# Patient Record
Sex: Male | Born: 1935
Health system: Southern US, Community
[De-identification: ages and names within clinical notes are randomized; demographics above are authoritative.]

## PROBLEM LIST (undated history)

## (undated) DIAGNOSIS — I10 Essential (primary) hypertension: Secondary | ICD-10-CM

## (undated) DIAGNOSIS — C7951 Secondary malignant neoplasm of bone: Secondary | ICD-10-CM

## (undated) DIAGNOSIS — Z960 Presence of urogenital implants: Secondary | ICD-10-CM

## (undated) DIAGNOSIS — Z978 Presence of other specified devices: Secondary | ICD-10-CM

## (undated) DIAGNOSIS — K219 Gastro-esophageal reflux disease without esophagitis: Secondary | ICD-10-CM

## (undated) DIAGNOSIS — C61 Malignant neoplasm of prostate: Secondary | ICD-10-CM

## (undated) DIAGNOSIS — Z634 Disappearance and death of family member: Secondary | ICD-10-CM

## (undated) DIAGNOSIS — N400 Enlarged prostate without lower urinary tract symptoms: Secondary | ICD-10-CM

---
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
## 1979-03-12 HISTORY — PX: MULTIPLE TOOTH EXTRACTIONS: SHX2053

## 2013-07-11 DIAGNOSIS — Z634 Disappearance and death of family member: Secondary | ICD-10-CM

## 2013-07-11 HISTORY — DX: Disappearance and death of family member: Z63.4

## 2014-08-30 ENCOUNTER — Encounter (HOSPITAL_COMMUNITY): Payer: Self-pay | Admitting: *Deleted

## 2014-08-30 ENCOUNTER — Emergency Department (HOSPITAL_COMMUNITY): Payer: Medicare Other

## 2014-08-30 ENCOUNTER — Emergency Department (HOSPITAL_COMMUNITY)
Admission: EM | Admit: 2014-08-30 | Discharge: 2014-08-30 | Disposition: A | Discharge status: Home / Self Care | Payer: Medicare Other | Attending: Emergency Medicine | Admitting: Emergency Medicine

## 2014-08-30 DIAGNOSIS — Z79899 Other long term (current) drug therapy: Secondary | ICD-10-CM | POA: Insufficient documentation

## 2014-08-30 DIAGNOSIS — R42 Dizziness and giddiness: Secondary | ICD-10-CM | POA: Insufficient documentation

## 2014-08-30 DIAGNOSIS — I10 Essential (primary) hypertension: Secondary | ICD-10-CM | POA: Insufficient documentation

## 2014-08-30 HISTORY — DX: Essential (primary) hypertension: I10

## 2014-08-30 LAB — COMPREHENSIVE METABOLIC PANEL
ALBUMIN: 3.9 g/dL (ref 3.5–5.2)
ALT: 14 U/L (ref 0–53)
ANION GAP: 11 (ref 5–15)
AST: 18 U/L (ref 0–37)
Alkaline Phosphatase: 131 U/L — ABNORMAL HIGH (ref 39–117)
BUN: 10 mg/dL (ref 6–23)
CO2: 22 mmol/L (ref 19–32)
CREATININE: 1.04 mg/dL (ref 0.50–1.35)
Calcium: 9.8 mg/dL (ref 8.4–10.5)
Chloride: 105 mmol/L (ref 96–112)
GFR calc Af Amer: 75 mL/min — ABNORMAL LOW (ref >90)
GFR, EST NON AFRICAN AMERICAN: 64 mL/min — AB (ref >90)
Glucose, Bld: 117 mg/dL — ABNORMAL HIGH (ref 70–99)
Potassium: 4.8 mmol/L (ref 3.5–5.1)
Sodium: 138 mmol/L (ref 135–145)
Total Bilirubin: 0.8 mg/dL (ref 0.3–1.2)
Total Protein: 7.6 g/dL (ref 6.0–8.3)

## 2014-08-30 LAB — CBC
HEMATOCRIT: 45.3 % (ref 39.0–52.0)
Hemoglobin: 14.7 g/dL (ref 13.0–17.0)
MCH: 29.8 pg (ref 26.0–34.0)
MCHC: 32.5 g/dL (ref 30.0–36.0)
MCV: 91.9 fL (ref 78.0–100.0)
Platelets: 166 10*3/uL (ref 150–400)
RBC: 4.93 MIL/uL (ref 4.22–5.81)
RDW: 14 % (ref 11.5–15.5)
WBC: 8.1 10*3/uL (ref 4.0–10.5)

## 2014-08-30 LAB — URINALYSIS, ROUTINE W REFLEX MICROSCOPIC
BILIRUBIN URINE: NEGATIVE
GLUCOSE, UA: NEGATIVE mg/dL
HGB URINE DIPSTICK: NEGATIVE
KETONES UR: NEGATIVE mg/dL
LEUKOCYTES UA: NEGATIVE
Nitrite: NEGATIVE
Protein, ur: NEGATIVE mg/dL
Specific Gravity, Urine: 1.013 (ref 1.005–1.030)
Urobilinogen, UA: 0.2 mg/dL (ref 0.0–1.0)
pH: 6 (ref 5.0–8.0)

## 2014-08-30 LAB — I-STAT TROPONIN, ED: TROPONIN I, POC: 0 ng/mL (ref 0.00–0.08)

## 2014-08-30 LAB — CBG MONITORING, ED: GLUCOSE-CAPILLARY: 126 mg/dL — AB (ref 70–99)

## 2014-08-30 LAB — BRAIN NATRIURETIC PEPTIDE: B Natriuretic Peptide: 156.3 pg/mL — ABNORMAL HIGH (ref 0.0–100.0)

## 2014-08-30 MED ORDER — MECLIZINE HCL 50 MG PO TABS: 50.0000 mg | ORAL_TABLET | Freq: Three times a day (TID) | ORAL | Status: DC | PRN

## 2014-08-30 MED ORDER — MECLIZINE HCL 25 MG PO TABS
25.0000 mg | ORAL_TABLET | Freq: Once | ORAL | Status: AC
  Administered 2014-08-30: 25 mg via ORAL
  Filled 2014-08-30: qty 1

## 2014-08-30 NOTE — ED Notes (Signed)
Pt reports feeling weak and lightheaded x 2 days and then off balance and dizzy since this am. Reports sob, no acute distress noted at triage.

## 2014-08-30 NOTE — ED Provider Notes (Signed)
Patient signed out to me by Cartner, PA-C.  Patient has had 2 days of dizziness.  Seems to be vertiginous, as it is worsened with head movements.  However, patient did fall off balance and had to be caught by son.  Additionally, he states that his vision has changed.  Now states that objects appear farther away than they actually are.  Plan:  MRI brain.  6:00 PM MRI is negative. Symptoms are reproducible with head movement. Symptoms resolve when patient with head still. Suspected vertigo. Treat with meclizine and discharged home. Recommend neurology follow-up. Patient understands and agrees with plan. He is stable and ready for discharge.  Results for orders placed or performed during the hospital encounter of 08/30/14  CBC  Result Value Ref Range   WBC 8.1 4.0 - 10.5 K/uL   RBC 4.93 4.22 - 5.81 MIL/uL   Hemoglobin 14.7 13.0 - 17.0 g/dL   HCT 45.3 39.0 - 52.0 %   MCV 91.9 78.0 - 100.0 fL   MCH 29.8 26.0 - 34.0 pg   MCHC 32.5 30.0 - 36.0 g/dL   RDW 14.0 11.5 - 15.5 %   Platelets 166 150 - 400 K/uL  BNP (order ONLY if patient complains of dyspnea/SOB AND you have documented it for THIS visit)  Result Value Ref Range   B Natriuretic Peptide 156.3 (H) 0.0 - 100.0 pg/mL  Comprehensive metabolic panel  Result Value Ref Range   Sodium 138 135 - 145 mmol/L   Potassium 4.8 3.5 - 5.1 mmol/L   Chloride 105 96 - 112 mmol/L   CO2 22 19 - 32 mmol/L   Glucose, Bld 117 (H) 70 - 99 mg/dL   BUN 10 6 - 23 mg/dL   Creatinine, Ser 1.04 0.50 - 1.35 mg/dL   Calcium 9.8 8.4 - 10.5 mg/dL   Total Protein 7.6 6.0 - 8.3 g/dL   Albumin 3.9 3.5 - 5.2 g/dL   AST 18 0 - 37 U/L   ALT 14 0 - 53 U/L   Alkaline Phosphatase 131 (H) 39 - 117 U/L   Total Bilirubin 0.8 0.3 - 1.2 mg/dL   GFR calc non Af Amer 64 (L) >90 mL/min   GFR calc Af Amer 75 (L) >90 mL/min   Anion gap 11 5 - 15  Urinalysis, Routine w reflex microscopic  Result Value Ref Range   Color, Urine YELLOW YELLOW   APPearance CLEAR CLEAR   Specific Gravity, Urine 1.013 1.005 - 1.030   pH 6.0 5.0 - 8.0   Glucose, UA NEGATIVE NEGATIVE mg/dL   Hgb urine dipstick NEGATIVE NEGATIVE   Bilirubin Urine NEGATIVE NEGATIVE   Ketones, ur NEGATIVE NEGATIVE mg/dL   Protein, ur NEGATIVE NEGATIVE mg/dL   Urobilinogen, UA 0.2 0.0 - 1.0 mg/dL   Nitrite NEGATIVE NEGATIVE   Leukocytes, UA NEGATIVE NEGATIVE  I-stat troponin, ED (not at [facility])  Result Value Ref Range   Troponin i, poc 0.00 0.00 - 0.08 ng/mL   Comment 3          CBG monitoring, ED  Result Value Ref Range   Glucose-Capillary 126 (H) 70 - 99 mg/dL   Comment 1 Notify RN    Comment 2 Documented in Char    Dg Chest 2 View  08/30/2014   CLINICAL DATA:  Began feeling dizzy with imbalance 3 days ago, pain at front of head when he stands up, former smoker, hypertension  EXAM: CHEST  2 VIEW  COMPARISON:  None  FINDINGS: Normal heart size,  mediastinal contours and pulmonary vascularity.  Atherosclerotic calcification at aortic arch.  Mild elevation of RIGHT diaphragm with RIGHT basilar atelectasis.  Biapical scarring.  Remaining lungs clear.  No pleural effusion or pneumothorax.  Scattered endplate spur formation thoracic spine.  IMPRESSION: Biapical scarring with RIGHT basilar atelectasis.   Electronically Signed   By: Lavonia Dana M.D.   On: 08/30/2014 15:52   Ct Head Wo Contrast  08/30/2014   CLINICAL DATA:  Lightheaded with unsteady gait for 2 days, generalized weakness, dizziness, no known injury, history hypertension  EXAM: CT HEAD WITHOUT CONTRAST  TECHNIQUE: Contiguous axial images were obtained from the base of the skull through the vertex without intravenous contrast.  COMPARISON:  None  FINDINGS: Generalized atrophy.  Normal ventricular morphology.  No midline shift or mass effect.  Otherwise normal appearance of brain parenchyma.  No intracranial hemorrhage, mass lesion or evidence acute infarction.  No extra-axial fluid collections.  Partial opacification of frontal sinus.  Osseous  structures unremarkable.  IMPRESSION: No acute intracranial abnormalities.  Mild frontal sinus disease.   Electronically Signed   By: Lavonia Dana M.D.   On: 08/30/2014 16:37   Mr Brain Wo Contrast  08/30/2014   CLINICAL DATA:  Hypertension and dizziness.  Rule out stroke.  EXAM: MRI HEAD WITHOUT CONTRAST  TECHNIQUE: Multiplanar, multiecho pulse sequences of the brain and surrounding structures were obtained without intravenous contrast.  COMPARISON:  CT 08/30/2014  FINDINGS: Cerebral atrophy most prominent in the frontal and temporal lobes. Negative for hydrocephalus. Brainstem and cerebellum are normal.  Negative for acute or chronic infarction. Cerebral white matter is normal.  Negative for intracranial hemorrhage or fluid collection  Negative for mass or edema.  No shift of the midline structures.  Pituitary normal in size.  Calvarium intact.  IMPRESSION: Cerebral atrophy most prominent in the frontal and temporal lobes. No acute abnormality. Negative for cerebral vascular ischemic change.   Electronically Signed   By: Franchot Gallo M.D.   On: 08/30/2014 17:36      Montine Circle, PA-C 08/30/14 1801  Veryl Speak, MD 08/30/14 [phone]

## 2014-08-30 NOTE — ED Notes (Signed)
Pt transported to Xray. 

## 2014-08-30 NOTE — ED Provider Notes (Signed)
CSN: [id number]     Arrival date & time 08/30/14  1410 History   First MD Initiated Contact with Patient 08/30/14 1436     Chief Complaint  Patient presents with  . Dizziness     (Consider location/radiation/quality/duration/timing/severity/associated sxs/prior Treatment) HPI Gregory [name] is a 81 y.o. male history of hypertension comes in for evaluation of dizziness. Patient is accompanied by his son who contributes to the history of present illness. Patient says for the past 3 days he has felt dizzy and characterizes this sensation has been "spun around a lot". He reports this sensation is exacerbated when he stands up or moves his head to one side or the other quickly. He reports today he lost his balance at approximately 11:00 when he stood up to go check the mail. His son was there to catch him and lower him to the couch. He also reports that he feels like his vision is changing and that things appear "much further away than they are". He denies headache, numbness or unilateral weakness, chest pain, shortness of breath, cough, nausea or vomiting, diaphoresis, abdominal pain, diarrhea or constipation, new skin rashes, urinary symptoms.  Past Medical History  Diagnosis Date  . Hypertension    History reviewed. No pertinent past surgical history. History reviewed. No pertinent family history. History  Substance Use Topics  . Smoking status: Not on file  . Smokeless tobacco: Not on file  . Alcohol Use: No    Review of Systems A 10 point review of systems was completed and was negative except for pertinent positives and negatives as mentioned in the history of present illness     Allergies  Review of patient's allergies indicates no known allergies.  Home Medications   Prior to Admission medications   Medication Sig Start Date End Date Taking? Authorizing Provider  atenolol (TENORMIN) 100 MG tablet Take 100 mg by mouth daily.   Yes Historical Provider, MD  finasteride  (PROSCAR) 5 MG tablet Take 5 mg by mouth daily.   Yes Historical Provider, MD  Multiple Vitamins-Minerals (MULTIVITAMIN WITH MINERALS) tablet Take 1 tablet by mouth daily.   Yes Historical Provider, MD  tamsulosin (FLOMAX) 0.4 MG CAPS capsule Take 0.4 mg by mouth daily after supper.   Yes Historical Provider, MD   BP 155/104 mmHg  Pulse 71  Temp(Src) 97.8 F (36.6 C) (Oral)  Resp 18  Ht 6\' 2"  (1.88 m)  Wt 200 lb (90.719 kg)  BMI 25.67 kg/m2  SpO2 96% Physical Exam  Constitutional: He is oriented to person, place, and time. He appears well-developed and well-nourished.  HENT:  Head: Normocephalic and atraumatic.  Mouth/Throat: Oropharynx is clear and moist.  Eyes: Conjunctivae are normal. Pupils are equal, round, and reactive to light. Right eye exhibits no discharge. Left eye exhibits no discharge. No scleral icterus.  Neck: Neck supple.  Cardiovascular: Normal rate, regular rhythm and normal heart sounds.   Pulmonary/Chest: Effort normal and breath sounds normal. No respiratory distress. He has no wheezes. He has no rales.  Abdominal: Soft. There is no tenderness.  Musculoskeletal: He exhibits no tenderness.  Neurological: He is alert and oriented to person, place, and time.  Cranial Nerves II-XII grossly intact. Motor and sensation 5/5 in all 4 extremities. Extraocular movements intact without nystagmus. Completes finger to nose and heel to shin coordination movements without difficulty. Dizzy sensation is reproduced when patient moves head quickly to left and right.  Skin: Skin is warm and dry. No rash noted.  Psychiatric:  He has a normal mood and affect.  Nursing note and vitals reviewed.   ED Course  Procedures (including critical care time) Labs Review Labs Reviewed  BRAIN NATRIURETIC PEPTIDE - Abnormal; Notable for the following:    B Natriuretic Peptide 156.3 (*)    All other components within normal limits  COMPREHENSIVE METABOLIC PANEL - Abnormal; Notable for the  following:    Glucose, Bld 117 (*)    Alkaline Phosphatase 131 (*)    GFR calc non Af Amer 64 (*)    GFR calc Af Amer 75 (*)    All other components within normal limits  CBG MONITORING, ED - Abnormal; Notable for the following:    Glucose-Capillary 126 (*)    All other components within normal limits  CBC  URINALYSIS, ROUTINE W REFLEX MICROSCOPIC  I-STAT TROPOININ, ED    Imaging Review Dg Chest 2 View  08/30/2014   CLINICAL DATA:  Began feeling dizzy with imbalance 3 days ago, pain at front of head when he stands up, former smoker, hypertension  EXAM: CHEST  2 VIEW  COMPARISON:  None  FINDINGS: Normal heart size, mediastinal contours and pulmonary vascularity.  Atherosclerotic calcification at aortic arch.  Mild elevation of RIGHT diaphragm with RIGHT basilar atelectasis.  Biapical scarring.  Remaining lungs clear.  No pleural effusion or pneumothorax.  Scattered endplate spur formation thoracic spine.  IMPRESSION: Biapical scarring with RIGHT basilar atelectasis.   Electronically Signed   By: Lavonia Dana M.D.   On: 08/30/2014 15:52     EKG Interpretation None     Meds given in ED:  Medications - No data to display  New Prescriptions   No medications on file   Filed Vitals:   08/30/14 1416  BP: 155/104  Pulse: 71  Temp: 97.8 F (36.6 C)  TempSrc: Oral  Resp: 18  Height: 6\' 2"  (1.88 m)  Weight: 200 lb (90.719 kg)  SpO2: 96%    MDM  Vitals stable - WNL -afebrile Pt resting comfortably in ED. PE- normal neuro exam Labwork- noncontributory Imaging--CXR shows biapical scarring with R basilar atelectasis. Pending CT and subsequent MR brain.  Prior to pt sign out, I discussed and reviewed this patient with my attending, Dr. Jeanell Sparrow.  Care transferred to [facility], PA-C for follow up on head CT and subsequent MR. Provided results are normal. I feel pt may be discharged home to follow up outpt with his PCP and Neurologist. His symptoms today appear to be consistent with  Vertigo. No evidence of an acute central process at this time. If MR negative, DC with Meclizine.   Final diagnoses:  None        Verl Dicker, PA-C 08/30/14 2001  Shaune Pollack, MD 09/03/14 [phone]

## 2014-08-30 NOTE — Discharge Instructions (Signed)
Dizziness °Dizziness is a common problem. It is a feeling of unsteadiness or light-headedness. You may feel like you are about to faint. Dizziness can lead to injury if you stumble or fall. A person of any age group can suffer from dizziness, but dizziness is more common in older adults. °CAUSES  °Dizziness can be caused by many different things, including: °· Middle ear problems. °· Standing for too long. °· Infections. °· An allergic reaction. °· Aging. °· An emotional response to something, such as the sight of blood. °· Side effects of medicines. °· Tiredness. °· Problems with circulation or blood pressure. °· Excessive use of alcohol or medicines, or illegal drug use. °· Breathing too fast (hyperventilation). °· An irregular heart rhythm (arrhythmia). °· A low red blood cell count (anemia). °· Pregnancy. °· Vomiting, diarrhea, fever, or other illnesses that cause body fluid loss (dehydration). °· Diseases or conditions such as Parkinson's disease, high blood pressure (hypertension), diabetes, and thyroid problems. °· Exposure to extreme heat. °DIAGNOSIS  °Your health care provider will ask about your symptoms, perform a physical exam, and perform an electrocardiogram (ECG) to record the electrical activity of your heart. Your health care provider may also perform other heart or blood tests to determine the cause of your dizziness. These may include: °· Transthoracic echocardiogram (TTE). During echocardiography, sound waves are used to evaluate how blood flows through your heart. °· Transesophageal echocardiogram (TEE). °· Cardiac monitoring. This allows your health care provider to monitor your heart rate and rhythm in real time. °· Holter monitor. This is a portable device that records your heartbeat and can help diagnose heart arrhythmias. It allows your health care provider to track your heart activity for several days if needed. °· Stress tests by exercise or by giving medicine that makes the heart beat  faster. °TREATMENT  °Treatment of dizziness depends on the cause of your symptoms and can vary greatly. °HOME CARE INSTRUCTIONS  °· Drink enough fluids to keep your urine clear or pale yellow. This is especially important in very hot weather. In older adults, it is also important in cold weather. °· Take your medicine exactly as directed if your dizziness is caused by medicines. When taking blood pressure medicines, it is especially important to get up slowly. °¨ Rise slowly from chairs and steady yourself until you feel okay. °¨ In the morning, first sit up on the side of the bed. When you feel okay, stand slowly while holding onto something until you know your balance is fine. °· Move your legs often if you need to stand in one place for a long time. Tighten and relax your muscles in your legs while standing. °· Have someone stay with you for 1-2 days if dizziness continues to be a problem. Do this until you feel you are well enough to stay alone. Have the person call your health care provider if he or she notices changes in you that are concerning. °· Do not drive or use heavy machinery if you feel dizzy. °· Do not drink alcohol. °SEEK IMMEDIATE MEDICAL CARE IF:  °· Your dizziness or light-headedness gets worse. °· You feel nauseous or vomit. °· You have problems talking, walking, or using your arms, hands, or legs. °· You feel weak. °· You are not thinking clearly or you have trouble forming sentences. It may take a friend or family member to notice this. °· You have chest pain, abdominal pain, shortness of breath, or sweating. °· Your vision changes. °· You notice   any bleeding. °· You have side effects from medicine that seems to be getting worse rather than better. °MAKE SURE YOU:  °· Understand these instructions. °· Will watch your condition. °· Will get help right away if you are not doing well or get worse. °Document Released: 12/21/2000 Document Revised: 07/02/2013 Document Reviewed: 01/14/2011 °ExitCare®  Patient Information ©2015 [employer]. This information is not intended to replace advice given to you by your health care provider. Make sure you discuss any questions you have with your health care provider. ° °Benign Positional Vertigo °Vertigo means you feel like you or your surroundings are moving when they are not. Benign positional vertigo is the most common form of vertigo. Benign means that the cause of your condition is not serious. Benign positional vertigo is more common in older adults. °CAUSES  °Benign positional vertigo is the result of an upset in the labyrinth system. This is an area in the middle ear that helps control your balance. This may be caused by a viral infection, head injury, or repetitive motion. However, often no specific cause is found. °SYMPTOMS  °Symptoms of benign positional vertigo occur when you move your head or eyes in different directions. Some of the symptoms may include: °· Loss of balance and falls. °· Vomiting. °· Blurred vision. °· Dizziness. °· Nausea. °· Involuntary eye movements (nystagmus). °DIAGNOSIS  °Benign positional vertigo is usually diagnosed by physical exam. If the specific cause of your benign positional vertigo is unknown, your caregiver may perform imaging tests, such as magnetic resonance imaging (MRI) or computed tomography (CT). °TREATMENT  °Your caregiver may recommend movements or procedures to correct the benign positional vertigo. Medicines such as meclizine, benzodiazepines, and medicines for nausea may be used to treat your symptoms. In rare cases, if your symptoms are caused by certain conditions that affect the inner ear, you may need surgery. °HOME CARE INSTRUCTIONS  °· Follow your caregiver's instructions. °· Move slowly. Do not make sudden body or head movements. °· Avoid driving. °· Avoid operating heavy machinery. °· Avoid performing any tasks that would be dangerous to you or others during a vertigo episode. °· Drink enough fluids to keep  your urine clear or pale yellow. °SEEK IMMEDIATE MEDICAL CARE IF:  °· You develop problems with walking, weakness, numbness, or using your arms, hands, or legs. °· You have difficulty speaking. °· You develop severe headaches. °· Your nausea or vomiting continues or gets worse. °· You develop visual changes. °· Your family or friends notice any behavioral changes. °· Your condition gets worse. °· You have a fever. °· You develop a stiff neck or sensitivity to light. °MAKE SURE YOU:  °· Understand these instructions. °· Will watch your condition. °· Will get help right away if you are not doing well or get worse. °Document Released: 04/04/2006 Document Revised: 09/19/2011 Document Reviewed: 03/17/2011 °ExitCare® Patient Information ©2015 [employer]. This information is not intended to replace advice given to you by your health care provider. Make sure you discuss any questions you have with your health care provider. ° °

## 2015-04-01 ENCOUNTER — Encounter (HOSPITAL_COMMUNITY): Payer: Self-pay | Admitting: Emergency Medicine

## 2015-04-01 ENCOUNTER — Emergency Department (HOSPITAL_COMMUNITY)
Admission: EM | Admit: 2015-04-01 | Discharge: 2015-04-01 | Disposition: A | Discharge status: Home / Self Care | Payer: Medicare Other | Attending: Emergency Medicine | Admitting: Emergency Medicine

## 2015-04-01 ENCOUNTER — Emergency Department (HOSPITAL_COMMUNITY): Payer: Medicare Other

## 2015-04-01 DIAGNOSIS — I10 Essential (primary) hypertension: Secondary | ICD-10-CM | POA: Diagnosis not present

## 2015-04-01 DIAGNOSIS — Z79899 Other long term (current) drug therapy: Secondary | ICD-10-CM | POA: Insufficient documentation

## 2015-04-01 DIAGNOSIS — R5383 Other fatigue: Secondary | ICD-10-CM | POA: Diagnosis not present

## 2015-04-01 DIAGNOSIS — Z87891 Personal history of nicotine dependence: Secondary | ICD-10-CM | POA: Diagnosis not present

## 2015-04-01 LAB — URINALYSIS, ROUTINE W REFLEX MICROSCOPIC
Bilirubin Urine: NEGATIVE
Glucose, UA: NEGATIVE mg/dL
HGB URINE DIPSTICK: NEGATIVE
Ketones, ur: NEGATIVE mg/dL
Leukocytes, UA: NEGATIVE
Nitrite: NEGATIVE
PH: 6.5 (ref 5.0–8.0)
Protein, ur: NEGATIVE mg/dL
SPECIFIC GRAVITY, URINE: 1.013 (ref 1.005–1.030)
UROBILINOGEN UA: 1 mg/dL (ref 0.0–1.0)

## 2015-04-01 LAB — TSH: TSH: 1.236 u[IU]/mL (ref 0.350–4.500)

## 2015-04-01 LAB — CBC
HEMATOCRIT: 45.2 % (ref 39.0–52.0)
HEMOGLOBIN: 15 g/dL (ref 13.0–17.0)
MCH: 29.6 pg (ref 26.0–34.0)
MCHC: 33.2 g/dL (ref 30.0–36.0)
MCV: 89.3 fL (ref 78.0–100.0)
Platelets: 187 10*3/uL (ref 150–400)
RBC: 5.06 MIL/uL (ref 4.22–5.81)
RDW: 13.8 % (ref 11.5–15.5)
WBC: 10.1 10*3/uL (ref 4.0–10.5)

## 2015-04-01 LAB — I-STAT TROPONIN, ED: Troponin i, poc: 0 ng/mL (ref 0.00–0.08)

## 2015-04-01 LAB — BASIC METABOLIC PANEL
ANION GAP: 12 (ref 5–15)
BUN: 13 mg/dL (ref 6–20)
CHLORIDE: 102 mmol/L (ref 101–111)
CO2: 23 mmol/L (ref 22–32)
Calcium: 9.5 mg/dL (ref 8.9–10.3)
Creatinine, Ser: 1.2 mg/dL (ref 0.61–1.24)
GFR calc Af Amer: >60 mL/min (ref >60)
GFR, EST NON AFRICAN AMERICAN: 54 mL/min — AB (ref >60)
GLUCOSE: 128 mg/dL — AB (ref 65–99)
POTASSIUM: 3.9 mmol/L (ref 3.5–5.1)
Sodium: 137 mmol/L (ref 135–145)

## 2015-04-01 LAB — POC OCCULT BLOOD, ED: Fecal Occult Bld: NEGATIVE

## 2015-04-01 LAB — CBG MONITORING, ED: GLUCOSE-CAPILLARY: 138 mg/dL — AB (ref 65–99)

## 2015-04-01 MED ORDER — SODIUM CHLORIDE 0.9 % IV BOLUS (SEPSIS)
1000.0000 mL | Freq: Once | INTRAVENOUS | Status: AC
  Administered 2015-04-01: 1000 mL via INTRAVENOUS

## 2015-04-01 MED ORDER — SULFACETAMIDE SODIUM 10 % OP SOLN: 2.0000 [drp] | OPHTHALMIC | Status: AC

## 2015-04-01 NOTE — ED Notes (Signed)
Lab states unable to add troponin on due to blood volume in tube, istat ordered and phlebotomy at bedside.

## 2015-04-01 NOTE — ED Notes (Signed)
Freda Munro in main lab will add on regular troponin.   PA, Lawyer made aware.

## 2015-04-01 NOTE — Discharge Instructions (Signed)
Return here as needed.  Follow-up with your primary care doctor.  Your testing here today did not show any significant abnormality

## 2015-04-01 NOTE — ED Provider Notes (Signed)
CSN: [id number]     Arrival date & time 04/01/15  1159 History   First MD Initiated Contact with Patient 04/01/15 1235     Chief Complaint  Patient presents with  . Fatigue     (Consider location/radiation/quality/duration/timing/severity/associated sxs/prior Treatment) HPI Patient presents to the emergency department with generalized weakness a thin ongoing for over a month.  The patient states that he has been feeling more fatigued and tired lately.  He states that nothing seems make his condition, better or worse.  Patient states that he may been going on longer than a month, but noticed it for the last month.  The patient states that he states that he has not had any chest pain, shortness of breath, headache, blurred vision, fevers, cough, numbness, dizziness, headache, back pain, neck pain, dysuria, incontinence, difficulty walking or syncope.  Patient states that he has not seen his primary care doctor about these symptoms Past Medical History  Diagnosis Date  . Hypertension   . Enlarged prostate    History reviewed. No pertinent past surgical history. No family history on file. Social History  Substance Use Topics  . Smoking status: Former Smoker    Types: Cigarettes  . Smokeless tobacco: None  . Alcohol Use: No    Review of Systems  All other systems negative except as documented in the HPI. All pertinent positives and negatives as reviewed in the HPI.  Allergies  Review of patient's allergies indicates no known allergies.  Home Medications   Prior to Admission medications   Medication Sig Start Date End Date Taking? Authorizing Alcide Memoli  atenolol (TENORMIN) 100 MG tablet Take 100 mg by mouth daily.    Historical Krystianna Soth, MD  finasteride (PROSCAR) 5 MG tablet Take 5 mg by mouth daily.    Historical Xzaria Teo, MD  meclizine (ANTIVERT) 50 MG tablet Take 1 tablet (50 mg total) by mouth 3 (three) times daily as needed. 08/30/14   Montine Circle, PA-C  Multiple  Vitamins-Minerals (MULTIVITAMIN WITH MINERALS) tablet Take 1 tablet by mouth daily.    Historical Bradley Handyside, MD  tamsulosin (FLOMAX) 0.4 MG CAPS capsule Take 0.4 mg by mouth daily after supper.    Historical Azalia Neuberger, MD   BP 137/72 mmHg  Pulse 62  Temp(Src) 97.6 F (36.4 C) (Oral)  Resp 14  SpO2 98% Physical Exam  Constitutional: He is oriented to person, place, and time. He appears well-developed and well-nourished. No distress.  HENT:  Head: Normocephalic and atraumatic.  Mouth/Throat: Oropharynx is clear and moist.  Eyes: Pupils are equal, round, and reactive to light.  Neck: Normal range of motion. Neck supple.  Cardiovascular: Normal rate, regular rhythm and normal heart sounds.  Exam reveals no gallop and no friction rub.   No murmur heard. Pulmonary/Chest: Effort normal and breath sounds normal. No respiratory distress.  Musculoskeletal: He exhibits no edema.  Neurological: He is alert and oriented to person, place, and time. He has normal strength and normal reflexes. No sensory deficit. He exhibits normal muscle tone. Coordination and gait normal. GCS eye subscore is 4. GCS verbal subscore is 5. GCS motor subscore is 6.  Skin: Skin is warm and dry. No rash noted. No erythema.  Psychiatric: He has a normal mood and affect. His behavior is normal.  Nursing note and vitals reviewed.   ED Course  Procedures (including critical care time) Labs Review Labs Reviewed  BASIC METABOLIC PANEL - Abnormal; Notable for the following:    Glucose, Bld 128 (*)    GFR  calc non Af Amer 54 (*)    All other components within normal limits  CBG MONITORING, ED - Abnormal; Notable for the following:    Glucose-Capillary 138 (*)    All other components within normal limits  CBC  URINALYSIS, ROUTINE W REFLEX MICROSCOPIC (NOT AT [facility])  TSH  POC OCCULT BLOOD, ED  Randolm Idol, ED  Randolm Idol, ED    Imaging Review Dg Chest 2 View  04/01/2015   CLINICAL DATA:  Fatigue, shortness  of breath for 3 weeks.  EXAM: CHEST  2 VIEW  COMPARISON:  08/30/2014  FINDINGS: Elevation of the right hemidiaphragm with right base atelectasis. No confluent opacity on the left. Biapical scarring, stable. Heart is normal size. No effusions. No acute bony abnormality.  IMPRESSION: Elevated right hemidiaphragm with right base atelectasis.   Electronically Signed   By: Rolm Baptise M.D.   On: 04/01/2015 15:14   I have personally reviewed and evaluated these images and lab results as part of my medical decision-making.   EKG Interpretation   Date/Time:  Wednesday April 01 2015 12:10:22 EDT Ventricular Rate:  73 PR Interval:  129 QRS Duration: 103 QT Interval:  459 QTC Calculation: 506 R Axis:   77 Text Interpretation:  Sinus rhythm RSR' in V1 or V2, probably normal  variant Borderline repolarization abnormality Prolonged QT interval  Baseline wander in lead(s) V4 V5 V6 Confirmed by PICKERING  MD, Ovid Curd  [phone]) on 04/01/2015 2:22:49 PM      Patient does not have any focal neurological deficits and no fine motor deficits.  He will be referred back to his primary care Dr. told to return here as needed.  The patient's generalized weakness has not been fully diagnosed at this point, based on her testing.  There is no significant abnormality.  His vital signs been stable  Dalia Heading, PA-C 04/07/15 [place], MD 04/11/15 [phone]

## 2015-04-01 NOTE — ED Notes (Signed)
Case manager informed of pt wishes on PCP different than the one pt has now. Pt provided with information sheet on new PCP and case manager working on possible appt for pt.

## 2015-04-01 NOTE — ED Notes (Signed)
Pt from home for eval of ongoing fatigue x4 weeks, pt denies any n/v/d or fevers. Pt denies any pain, cp or sob. Pt reports that when he changes positions has to sit down "because I get real tired and weak." pt does reports increase in urination but reports hx of enlarged prostate. No neuro deficits noted. Pt axo x4.

## 2015-04-01 NOTE — ED Notes (Signed)
Pt transported to xray 

## 2015-04-01 NOTE — ED Notes (Signed)
PT placed in gown and in bed. Pt monitored by pulse ox, bp cuff, and 12-lead. 

## 2015-04-01 NOTE — Care Management Note (Signed)
Case Management Note  Patient Details  Name: Gregory [name] MRN: [id number] Date of Birth: [birth year]  Subjective/Objective:                  Pt from home for eval of ongoing fatigue x4 weeks, pt denies any n/v/d or fevers.  Action/Plan: Follow for disposition needs.  Expected Discharge Date:        04/01/2015          Expected Discharge Plan:  Home/Self Care  In-House Referral:  PCP / [facility]  Discharge planning Services  CM Consult, Follow-up appt scheduled  Post Acute Care Choice:  NA Choice offered to:  NA  DME Arranged:  N/A DME Agency:  NA  HH Arranged:  NA HH Agency:  NA  Status of Service:  Completed, signed off  Medicare Important Message Given:    Date Medicare IM Given:    Medicare IM give by:    Date Additional Medicare IM Given:    Additional Medicare Important Message give by:     If discussed at [place] of Stay Meetings, dates discussed:    Additional Comments: Camellia J. Clydene Laming, RN, BSN, [place] [phone] ED CM consulted regarding PCP establishment and insurance enrollment. Pt presented to [facility] ED today with fatigue. NCM met with pt at bedside; pt confirms not having access to f/u care with PCP or insurance coverage. Discussed with patient importance and benefits of establishing PCP, and not utilizing the ED for primary care needs. Pt verbalized understanding and is in agreement. Discussed other options, provided list of local  affordable PCPs.  Pt voiced interest in the [facility] and [place].  NCM advised that [facility]  Internal Medicine providers are seeing pts at [facility]. Pt verbalized understanding. NCM set up appointment with Cammie Sickle, NP 04/13/15 _0 :30.  Fuller Mandril, RN 04/01/2015, 3:45 PM

## 2015-04-01 NOTE — ED Notes (Signed)
PA at bedside.

## 2015-04-13 ENCOUNTER — Ambulatory Visit (INDEPENDENT_AMBULATORY_CARE_PROVIDER_SITE_OTHER): Payer: Medicare Other | Admitting: Family Medicine

## 2015-04-13 ENCOUNTER — Encounter: Payer: Self-pay | Admitting: Family Medicine

## 2015-04-13 VITALS — BP 138/80 | HR 71 | Temp 98.0°F | Resp 20 | Ht 74.0 in | Wt 204.0 lb

## 2015-04-13 DIAGNOSIS — I10 Essential (primary) hypertension: Secondary | ICD-10-CM | POA: Diagnosis not present

## 2015-04-13 DIAGNOSIS — R739 Hyperglycemia, unspecified: Secondary | ICD-10-CM

## 2015-04-13 DIAGNOSIS — N400 Enlarged prostate without lower urinary tract symptoms: Secondary | ICD-10-CM | POA: Diagnosis not present

## 2015-04-13 DIAGNOSIS — Z0000 Encounter for general adult medical examination without abnormal findings: Secondary | ICD-10-CM

## 2015-04-13 DIAGNOSIS — R5383 Other fatigue: Secondary | ICD-10-CM | POA: Diagnosis not present

## 2015-04-13 LAB — COMPLETE METABOLIC PANEL WITH GFR
ALT: 11 U/L (ref 9–46)
AST: 19 U/L (ref 10–35)
Albumin: 4.6 g/dL (ref 3.6–5.1)
Alkaline Phosphatase: 203 U/L — ABNORMAL HIGH (ref 40–115)
BUN: 17 mg/dL (ref 7–25)
CHLORIDE: 102 mmol/L (ref 98–110)
CO2: 28 mmol/L (ref 20–31)
CREATININE: 1.28 mg/dL — AB (ref 0.70–1.11)
Calcium: 10.5 mg/dL — ABNORMAL HIGH (ref 8.6–10.3)
GFR, Est African American: 59 mL/min — ABNORMAL LOW (ref >=60)
GFR, Est Non African American: 51 mL/min — ABNORMAL LOW (ref >=60)
GLUCOSE: 119 mg/dL — AB (ref 65–99)
Potassium: 4 mmol/L (ref 3.5–5.3)
Sodium: 141 mmol/L (ref 135–146)
Total Bilirubin: 0.7 mg/dL (ref 0.2–1.2)
Total Protein: 7.7 g/dL (ref 6.1–8.1)

## 2015-04-13 LAB — HEMOGLOBIN A1C
Hgb A1c MFr Bld: 6.2 % — ABNORMAL HIGH (ref <5.7)
Mean Plasma Glucose: 131 mg/dL — ABNORMAL HIGH (ref <117)

## 2015-04-13 NOTE — Patient Instructions (Signed)
DASH Eating Plan DASH stands for "Dietary Approaches to Stop Hypertension." The DASH eating plan is a healthy eating plan that has been shown to reduce high blood pressure (hypertension). Additional health benefits may include reducing the risk of type 2 diabetes mellitus, heart disease, and stroke. The DASH eating plan may also help with weight loss. WHAT DO I NEED TO KNOW ABOUT THE DASH EATING PLAN? For the DASH eating plan, you will follow these general guidelines:  Choose foods with a percent daily value for sodium of less than 5% (as listed on the food label).  Use salt-free seasonings or herbs instead of table salt or sea salt.  Check with your health care provider or pharmacist before using salt substitutes.  Eat lower-sodium products, often labeled as "lower sodium" or "no salt added."  Eat fresh foods.  Eat more vegetables, fruits, and low-fat dairy products.  Choose whole grains. Look for the word "whole" as the first word in the ingredient list.  Choose fish and skinless chicken or turkey more often than red meat. Limit fish, poultry, and meat to 6 oz (170 g) each day.  Limit sweets, desserts, sugars, and sugary drinks.  Choose heart-healthy fats.  Limit cheese to 1 oz (28 g) per day.  Eat more home-cooked food and less restaurant, buffet, and fast food.  Limit fried foods.  Cook foods using methods other than frying.  Limit canned vegetables. If you do use them, rinse them well to decrease the sodium.  When eating at a restaurant, ask that your food be prepared with less salt, or no salt if possible. WHAT FOODS CAN I EAT? Seek help from a dietitian for individual calorie needs. Grains Whole grain or whole wheat bread. Brown rice. Whole grain or whole wheat pasta. Quinoa, bulgur, and whole grain cereals. Low-sodium cereals. Corn or whole wheat flour tortillas. Whole grain cornbread. Whole grain crackers. Low-sodium crackers. Vegetables Fresh or frozen vegetables  (raw, steamed, roasted, or grilled). Low-sodium or reduced-sodium tomato and vegetable juices. Low-sodium or reduced-sodium tomato sauce and paste. Low-sodium or reduced-sodium canned vegetables.  Fruits All fresh, canned (in natural juice), or frozen fruits. Meat and Other Protein Products Ground beef (85% or leaner), grass-fed beef, or beef trimmed of fat. Skinless chicken or turkey. Ground chicken or turkey. Pork trimmed of fat. All fish and seafood. Eggs. Dried beans, peas, or lentils. Unsalted nuts and seeds. Unsalted canned beans. Dairy Low-fat dairy products, such as skim or 1% milk, 2% or reduced-fat cheeses, low-fat ricotta or cottage cheese, or plain low-fat yogurt. Low-sodium or reduced-sodium cheeses. Fats and Oils Tub margarines without trans fats. Light or reduced-fat mayonnaise and salad dressings (reduced sodium). Avocado. Safflower, olive, or canola oils. Natural peanut or almond butter. Other Unsalted popcorn and pretzels. The items listed above may not be a complete list of recommended foods or beverages. Contact your dietitian for more options. WHAT FOODS ARE NOT RECOMMENDED? Grains White bread. White pasta. White rice. Refined cornbread. Bagels and croissants. Crackers that contain trans fat. Vegetables Creamed or fried vegetables. Vegetables in a cheese sauce. Regular canned vegetables. Regular canned tomato sauce and paste. Regular tomato and vegetable juices. Fruits Dried fruits. Canned fruit in light or heavy syrup. Fruit juice. Meat and Other Protein Products Fatty cuts of meat. Ribs, chicken wings, bacon, sausage, bologna, salami, chitterlings, fatback, hot dogs, bratwurst, and packaged luncheon meats. Salted nuts and seeds. Canned beans with salt. Dairy Whole or 2% milk, cream, half-and-half, and cream cheese. Whole-fat or sweetened yogurt. Full-fat   cheeses or blue cheese. Nondairy creamers and whipped toppings. Processed cheese, cheese spreads, or cheese  curds. Condiments Onion and garlic salt, seasoned salt, table salt, and sea salt. Canned and packaged gravies. Worcestershire sauce. Tartar sauce. Barbecue sauce. Teriyaki sauce. Soy sauce, including reduced sodium. Steak sauce. Fish sauce. Oyster sauce. Cocktail sauce. Horseradish. Ketchup and mustard. Meat flavorings and tenderizers. Bouillon cubes. Hot sauce. Tabasco sauce. Marinades. Taco seasonings. Relishes. Fats and Oils Butter, stick margarine, lard, shortening, ghee, and bacon fat. Coconut, palm kernel, or palm oils. Regular salad dressings. Other Pickles and olives. Salted popcorn and pretzels. The items listed above may not be a complete list of foods and beverages to avoid. Contact your dietitian for more information. WHERE CAN I FIND MORE INFORMATION? National Heart, Lung, and Blood Institute: travelstabloid.com Document Released: 06/16/2011 Document Revised: 11/11/2013 Document Reviewed: 05/01/2013 [facility] Patient Information 2015 [place], [place]. This information is not intended to replace advice given to you by your health care provider. Make sure you discuss any questions you have with your health care provider. Hypertension Hypertension, commonly called high blood pressure, is when the force of blood pumping through your arteries is too strong. Your arteries are the blood vessels that carry blood from your heart throughout your body. A blood pressure reading consists of a higher number over a lower number, such as 110/72. The higher number (systolic) is the pressure inside your arteries when your heart pumps. The lower number (diastolic) is the pressure inside your arteries when your heart relaxes. Ideally you want your blood pressure below 120/80. Hypertension forces your heart to work harder to pump blood. Your arteries may become narrow or stiff. Having hypertension puts you at risk for heart disease, stroke, and other problems.  RISK  FACTORS Some risk factors for high blood pressure are controllable. Others are not.  Risk factors you cannot control include:   Race. You may be at higher risk if you are African American.  Age. Risk increases with age.  Gender. Men are at higher risk than women before age 37 years. After age 55, women are at higher risk than men. Risk factors you can control include:  Not getting enough exercise or physical activity.  Being overweight.  Getting too much fat, sugar, calories, or salt in your diet.  Drinking too much alcohol. SIGNS AND SYMPTOMS Hypertension does not usually cause signs or symptoms. Extremely high blood pressure (hypertensive crisis) may cause headache, anxiety, shortness of breath, and nosebleed. DIAGNOSIS  To check if you have hypertension, your health care provider will measure your blood pressure while you are seated, with your arm held at the level of your heart. It should be measured at least twice using the same arm. Certain conditions can cause a difference in blood pressure between your right and left arms. A blood pressure reading that is higher than normal on one occasion does not mean that you need treatment. If one blood pressure reading is high, ask your health care provider about having it checked again. TREATMENT  Treating high blood pressure includes making lifestyle changes and possibly taking medicine. Living a healthy lifestyle can help lower high blood pressure. You may need to change some of your habits. Lifestyle changes may include:  Following the DASH diet. This diet is high in fruits, vegetables, and whole grains. It is low in salt, red meat, and added sugars.  Getting at least 2 hours of brisk physical activity every week.  Losing weight if necessary.  Not smoking.  Limiting  alcoholic beverages.  Learning ways to reduce stress. If lifestyle changes are not enough to get your blood pressure under control, your health care provider may  prescribe medicine. You may need to take more than one. Work closely with your health care provider to understand the risks and benefits. HOME CARE INSTRUCTIONS  Have your blood pressure rechecked as directed by your health care provider.   Take medicines only as directed by your health care provider. Follow the directions carefully. Blood pressure medicines must be taken as prescribed. The medicine does not work as well when you skip doses. Skipping doses also puts you at risk for problems.   Do not smoke.   Monitor your blood pressure at home as directed by your health care provider. SEEK MEDICAL CARE IF:   You think you are having a reaction to medicines taken.  You have recurrent headaches or feel dizzy.  You have swelling in your ankles.  You have trouble with your vision. SEEK IMMEDIATE MEDICAL CARE IF:  You develop a severe headache or confusion.  You have unusual weakness, numbness, or feel faint.  You have severe chest or abdominal pain.  You vomit repeatedly.  You have trouble breathing. MAKE SURE YOU:   Understand these instructions.  Will watch your condition.  Will get help right away if you are not doing well or get worse. Document Released: 06/27/2005 Document Revised: 11/11/2013 Document Reviewed: 04/19/2013 [facility] Patient Information 2015 [place], [place]. This information is not intended to replace advice given to you by your health care provider. Make sure you discuss any questions you have with your health care provider. Benign Prostatic Hypertrophy The prostate gland is part of the reproductive system of men. A normal prostate is about the size and shape of a walnut. The prostate gland produces a fluid that is mixed with sperm to make semen. This gland surrounds the urethra and is located in front of the rectum and just below the bladder. The bladder is where urine is stored. The urethra is the tube through which urine passes from the bladder to get  out of the body. The prostate grows as a man ages. An enlarged prostate not caused by cancer is called benign prostatic hypertrophy (BPH). An enlarged prostate can press on the urethra. This can make it harder to pass urine. In the early stages of enlargement, the bladder can get by with a narrowed urethra by forcing the urine through. If the problem gets worse, medical or surgical treatment may be required.  This condition should be followed by your health care provider. The accumulation of urine in the bladder can cause infection. Back pressure and infection can progress to bladder damage and kidney (renal) failure. If needed, your health care provider may refer you to a specialist in kidney and prostate disease (urologist). CAUSES  BPH is a common health problem in men older than 50 years. This condition is a normal part of aging. However, not all men will develop problems from this condition. If the enlargement grows away from the urethra, then there will not be any compression of the urethra and resistance to urine flow.If the growth is toward the urethra and compresses it, you will experience difficulty urinating.  SYMPTOMS   Not able to completely empty your bladder.  Getting up often during the night to urinate.  Need to urinate frequently during the day.  Difficultly starting urine flow.  Decrease in size and strength of your urine stream.  Dribbling after urination.  Pain on urination (more common with infection).  Inability to pass urine. This needs immediate treatment.  The development of a urinary tract infection. DIAGNOSIS  These tests will help your health care provider understand your problem:  A thorough history and physical examination.  A urination history, with the number of times you urinate, the amounts of urine, the strength of the urine stream, and the feeling of emptiness or fullness after urinating.  A postvoid bladder scan that measures any amount of urine  that may remain in your bladder after you finish urinating.  Digital rectal exam. In a rectal exam, your health care provider checks your prostate by putting a gloved, lubricated finger into your rectum to feel the back of your prostate gland. This exam detects the size of your gland and abnormal lumps or growths.  Exam of your urine (urinalysis).  Prostate specific antigen (PSA) screening. This is a blood test used to screen for prostate cancer.  Rectal ultrasonography. This test uses sound waves to electronically produce a picture of your prostate gland. TREATMENT  Once symptoms begin, your health care provider will monitor your condition. Of the men with this condition, one third will have symptoms that stabilize, one third will have symptoms that improve, and one third will have symptoms that progress in the first year. Mild symptoms may not need treatment. Simple observation and yearly exams may be all that is required. Medicines and surgery are options for more severe problems. Your health care provider can help you make an informed decision for what is best. Two classes of medicines are available for relief of prostate symptoms:  Medicines that shrink the prostate. This helps relieve symptoms. These medicines take time to work, and it may be months before any improvement is seen.  Uncommon side effects include problems with sexual function.  Medicines to relax the muscle of the prostate. This also relieves the obstruction by reducing any compression on the urethra.This group of medicines work much faster than those that reduce the size of the prostate gland. Usually, one can experience improvement in days to weeks..  Side effects can include dizziness, fatigue, lightheadedness, and retrograde ejaculation (diminished volume of ejaculate). Several types of surgical treatments are available for relief of prostate symptoms:  Transurethral resection of the prostate (TURP)--In this  treatment, an instrument is inserted through opening at the tip of the penis. It is used to cut away pieces of the inner core of the prostate. The pieces are removed through the same opening of the penis. This removes the obstruction and helps get rid of the symptoms.  Transurethral incision (TUIP)--In this procedure, small cuts are made in the prostate. This lessens the prostates pressure on the urethra.  Transurethral microwave thermotherapy (TUMT)--This procedure uses microwaves to create heat. The heat destroys and removes a small amount of prostate tissue.  Transurethral needle ablation (TUNA)--This is a procedure that uses radio frequencies to do the same as TUMT.  Interstitial laser coagulation (ILC)--This is a procedure that uses a laser to do the same as TUMT and TUNA.  Transurethral electrovaporization (TUVP)--This is a procedure that uses electrodes to do the same as the procedures listed above. SEEK MEDICAL CARE IF:   You develop a fever.  There is unexplained back pain.  Symptoms are not helped by medicines prescribed.  You develop side effects from the medicine you are taking.  Your urine becomes very dark or has a bad smell.  Your lower abdomen becomes distended and you have difficulty  passing your urine. SEEK IMMEDIATE MEDICAL CARE IF:   You are suddenly unable to urinate. This is an emergency. You should be seen immediately.  There are large amounts of blood or clots in the urine.  Your urinary problems become unmanageable.  You develop lightheadedness, severe dizziness, or you feel faint.  You develop moderate to severe low back or flank pain.  You develop chills or fever. Document Released: 06/27/2005 Document Revised: 07/02/2013 Document Reviewed: 01/10/2013 [facility] Patient Information 2015 [place], [place]. This information is not intended to replace advice given to you by your health care provider. Make sure you discuss any questions you have with your  health care provider.

## 2015-04-13 NOTE — Progress Notes (Signed)
Subjective:    Patient ID: Gregory [name], male    DOB: [birth year], 81 y.o.   MRN: [id number]  HPI [name] an 81 year old male who recently relocated from [place], [place] presents to establish care. Gregory [name]  was a patient of Dr. Chrissie [name], at the [facility]. He also states that he has been followed by Gregory [name] since relocating. Patient was recently seen in the emergency department for fatigue. He states that he has been feeling extremely fatigued over the past month. He states that he has always been very active, but is now tiring easily. He reports a history of hypertension and enlarged prostate and has been taking medications consistently.  He states that he is adherent to a low sodium diet. Patient denies chest pain, dyspnea, lower extremity edema, orthopnea, palpitations, syncope and tachypnea.  Cardiovascular risk factors include: advanced age (older than 32 for men, 3 for women).    Past Medical History  Diagnosis Date  . Hypertension   . Enlarged prostate    Social History   Social History  . Marital Status: Divorced    Spouse Name: N/A  . Number of Children: N/A  . Years of Education: N/A   Occupational History  . Not on file.   Social History Main Topics  . Smoking status: Former Smoker    Types: Cigarettes  . Smokeless tobacco: Not on file  . Alcohol Use: No  . Drug Use: No  . Sexual Activity: Not on file   Other Topics Concern  . Not on file   Social History Narrative   Review of Systems  Constitutional: Positive for fatigue. Negative for unexpected weight change.  HENT: Negative.   Eyes: Negative.  Negative for photophobia and visual disturbance.  Respiratory: Negative.  Negative for chest tightness.   Cardiovascular: Negative.  Negative for chest pain and palpitations.  Gastrointestinal: Negative.   Endocrine: Negative for polydipsia, polyphagia and polyuria.  Genitourinary: Positive for dysuria  (occasional dysuria). Negative for urgency and hematuria.  Musculoskeletal: Negative.   Skin: Negative.   Allergic/Immunologic: Negative for immunocompromised state.  Neurological: Negative for dizziness, syncope, light-headedness and headaches.  Hematological: Negative.   Psychiatric/Behavioral: Negative.        Objective:   Physical Exam  Constitutional: He is oriented to person, place, and time. He appears well-developed and well-nourished.  HENT:  Head: Normocephalic and atraumatic.  Right Ear: External ear normal.  Left Ear: External ear normal.  Mouth/Throat: Oropharynx is clear and moist.  Eyes: EOM are normal. Pupils are equal, round, and reactive to light.  Neck: Normal range of motion. Neck supple.  Cardiovascular: Normal rate, regular rhythm, normal heart sounds and intact distal pulses.   Pulmonary/Chest: Effort normal and breath sounds normal.  Abdominal: Soft. Bowel sounds are normal.  Musculoskeletal: Normal range of motion.  Neurological: He is alert and oriented to person, place, and time. He has normal reflexes.  Skin: Skin is warm and dry.  Psychiatric: He has a normal mood and affect. His behavior is normal. Judgment and thought content normal.         BP 138/80 mmHg  Pulse 71  Temp(Src) 98 F (36.7 C) (Oral)  Resp 20  Ht 6\' 2"  (1.88 m)  Wt 204 lb (92.534 kg)  BMI 26.18 kg/m2  SpO2 99% Assessment & Plan:  1. Essential hypertension Blood pressure is at goal on current medication regimen. Continue as prescribed. The patient is asked to make an attempt  to improve diet and exercise patterns to aid in medical management of this problem. Will evaluate urinalysis for proteinuria.  - Ambulatory referral to Ophthalmology - POCT urinalysis dipstick - COMPLETE METABOLIC PANEL WITH GFR  2. Enlarged prostate Patient states that he has been on Proscar and Tamsulosin for several years. He states that he has not had annual prostate examination. Refuses today, will  schedule complete physical examination on next visit. Will check PSA today. Patient currently denies urinary problems.  - PSA  3. Other fatigue Reviewed labs, mild hyperglycemia present will screen for diabetes.  EKG reviewed  4. Hyperglycemia  - Hemoglobin A1c  5. Routine health maintenance Will review records from [facility] as they become available.  Will schedule for a complete physical examination with fasting lipid panel and prostate examination in 1 month Patient reports that he has not had an eye examination since moving to [place], will send referral to opthalmology due to long history of hypertension.     RTC: 3 months or as needed The patient was given clear instructions to go to ER or return to medical center if symptoms do not improve, worsen or new problems develop. The patient verbalized understanding. Will notify patient with laboratory results. Dorena Dew, FNP

## 2015-04-14 ENCOUNTER — Telehealth: Payer: Self-pay | Admitting: Family Medicine

## 2015-04-14 DIAGNOSIS — R739 Hyperglycemia, unspecified: Secondary | ICD-10-CM | POA: Insufficient documentation

## 2015-04-14 DIAGNOSIS — R5383 Other fatigue: Secondary | ICD-10-CM | POA: Insufficient documentation

## 2015-04-14 DIAGNOSIS — C61 Malignant neoplasm of prostate: Secondary | ICD-10-CM | POA: Insufficient documentation

## 2015-04-14 DIAGNOSIS — R5381 Other malaise: Secondary | ICD-10-CM | POA: Insufficient documentation

## 2015-04-14 DIAGNOSIS — I10 Essential (primary) hypertension: Secondary | ICD-10-CM | POA: Insufficient documentation

## 2015-04-14 DIAGNOSIS — R972 Elevated prostate specific antigen [PSA]: Principal | ICD-10-CM

## 2015-04-14 DIAGNOSIS — N400 Enlarged prostate without lower urinary tract symptoms: Secondary | ICD-10-CM

## 2015-04-14 LAB — PSA: PSA: 18.74 ng/mL — ABNORMAL HIGH (ref <=4.00)

## 2015-04-14 NOTE — Telephone Encounter (Signed)
Reviewed labs, patient has a markedly elevated PSA at 18.74. Will send referral to urology.  Hemoglobin A1C is elevated at 6.2, goal is 5.7%. Recommend lowering dietary carbohydrates and fat. Will not start medication at this point. Will re-check in 3 months.   Also, creatinine is mildly elevated at 1.28. Will re-check BMP at scheduled follow-up. Remind patient to take anti-hypertensive medications consistently.    Dorena Dew, FNP

## 2015-04-17 ENCOUNTER — Telehealth: Payer: Self-pay

## 2015-04-17 NOTE — Telephone Encounter (Signed)
Called and advised patient of Urology appointment with [facility] scheduled for 06/01/2015 @2 :30pm. Patient verbalizes understanding. Thanks!~

## 2015-04-22 ENCOUNTER — Other Ambulatory Visit: Payer: Self-pay

## 2015-04-22 ENCOUNTER — Telehealth: Payer: Self-pay | Admitting: Family Medicine

## 2015-04-22 MED ORDER — ATENOLOL 100 MG PO TABS
100.0000 mg | ORAL_TABLET | Freq: Every day | ORAL | Status: DC
Start: 2015-04-22 — End: 2015-08-09

## 2015-04-22 NOTE — Telephone Encounter (Signed)
Refill for atenolol has been sent in, requested via fax. Thanks!

## 2015-04-22 NOTE — Telephone Encounter (Signed)
This was refilled this morning. Thanks!

## 2015-05-07 ENCOUNTER — Other Ambulatory Visit: Payer: Self-pay | Admitting: Family Medicine

## 2015-05-07 MED ORDER — FINASTERIDE 5 MG PO TABS: 5.0000 mg | ORAL_TABLET | Freq: Every day | ORAL | Status: DC

## 2015-05-07 NOTE — Telephone Encounter (Signed)
Refill sent into pharmacy. Thanks!  

## 2015-05-11 ENCOUNTER — Other Ambulatory Visit: Payer: Self-pay | Admitting: Family Medicine

## 2015-05-22 ENCOUNTER — Telehealth: Payer: Self-pay | Admitting: Family Medicine

## 2015-05-22 NOTE — Telephone Encounter (Signed)
Needs a copy of the PSA results faxed to [phone] . Please call [phone]  [phone] if you have any questions.

## 2015-05-22 NOTE — Telephone Encounter (Signed)
This has been faxed. Thanks!

## 2015-07-14 ENCOUNTER — Ambulatory Visit (INDEPENDENT_AMBULATORY_CARE_PROVIDER_SITE_OTHER): Payer: Medicare Other | Admitting: Family Medicine

## 2015-07-14 ENCOUNTER — Encounter: Payer: Self-pay | Admitting: Family Medicine

## 2015-07-14 VITALS — BP 127/83 | HR 113 | Temp 97.9°F | Resp 16 | Ht 74.0 in | Wt 200.0 lb

## 2015-07-14 DIAGNOSIS — N400 Enlarged prostate without lower urinary tract symptoms: Secondary | ICD-10-CM

## 2015-07-14 DIAGNOSIS — I10 Essential (primary) hypertension: Secondary | ICD-10-CM

## 2015-07-14 DIAGNOSIS — R739 Hyperglycemia, unspecified: Secondary | ICD-10-CM | POA: Diagnosis not present

## 2015-07-14 LAB — COMPLETE METABOLIC PANEL WITH GFR
ALBUMIN: 4.4 g/dL (ref 3.6–5.1)
ALK PHOS: 263 U/L — AB (ref 40–115)
ALT: 11 U/L (ref 9–46)
AST: 25 U/L (ref 10–35)
BUN: 13 mg/dL (ref 7–25)
CHLORIDE: 102 mmol/L (ref 98–110)
CO2: 30 mmol/L (ref 20–31)
Calcium: 9.9 mg/dL (ref 8.6–10.3)
Creat: 1.17 mg/dL — ABNORMAL HIGH (ref 0.70–1.11)
GFR, EST NON AFRICAN AMERICAN: 57 mL/min — AB (ref >=60)
GFR, Est African American: 66 mL/min (ref >=60)
GLUCOSE: 91 mg/dL (ref 65–99)
POTASSIUM: 4.1 mmol/L (ref 3.5–5.3)
SODIUM: 143 mmol/L (ref 135–146)
Total Bilirubin: 0.8 mg/dL (ref 0.2–1.2)
Total Protein: 7.3 g/dL (ref 6.1–8.1)

## 2015-07-14 LAB — POCT URINALYSIS DIP (DEVICE)
BILIRUBIN URINE: NEGATIVE
GLUCOSE, UA: NEGATIVE mg/dL
Hgb urine dipstick: NEGATIVE
KETONES UR: NEGATIVE mg/dL
Leukocytes, UA: NEGATIVE
NITRITE: NEGATIVE
PH: 6 (ref 5.0–8.0)
PROTEIN: NEGATIVE mg/dL
Specific Gravity, Urine: 1.02 (ref 1.005–1.030)
Urobilinogen, UA: 0.2 mg/dL (ref 0.0–1.0)

## 2015-07-14 MED ORDER — TAMSULOSIN HCL 0.4 MG PO CAPS: 0.4000 mg | ORAL_CAPSULE | Freq: Every day | ORAL | Status: DC

## 2015-07-14 NOTE — Patient Instructions (Signed)
DASH Eating Plan DASH stands for "Dietary Approaches to Stop Hypertension." The DASH eating plan is a healthy eating plan that has been shown to reduce high blood pressure (hypertension). Additional health benefits may include reducing the risk of type 2 diabetes mellitus, heart disease, and stroke. The DASH eating plan may also help with weight loss. WHAT DO I NEED TO KNOW ABOUT THE DASH EATING PLAN? For the DASH eating plan, you will follow these general guidelines:  Choose foods with a percent daily value for sodium of less than 5% (as listed on the food label).  Use salt-free seasonings or herbs instead of table salt or sea salt.  Check with your health care provider or pharmacist before using salt substitutes.  Eat lower-sodium products, often labeled as "lower sodium" or "no salt added."  Eat fresh foods.  Eat more vegetables, fruits, and low-fat dairy products.  Choose whole grains. Look for the word "whole" as the first word in the ingredient list.  Choose fish and skinless chicken or turkey more often than red meat. Limit fish, poultry, and meat to 6 oz (170 g) each day.  Limit sweets, desserts, sugars, and sugary drinks.  Choose heart-healthy fats.  Limit cheese to 1 oz (28 g) per day.  Eat more home-cooked food and less restaurant, buffet, and fast food.  Limit fried foods.  Cook foods using methods other than frying.  Limit canned vegetables. If you do use them, rinse them well to decrease the sodium.  When eating at a restaurant, ask that your food be prepared with less salt, or no salt if possible. WHAT FOODS CAN I EAT? Seek help from a dietitian for individual calorie needs. Grains Whole grain or whole wheat bread. Brown rice. Whole grain or whole wheat pasta. Quinoa, bulgur, and whole grain cereals. Low-sodium cereals. Corn or whole wheat flour tortillas. Whole grain cornbread. Whole grain crackers. Low-sodium crackers. Vegetables Fresh or frozen vegetables  (raw, steamed, roasted, or grilled). Low-sodium or reduced-sodium tomato and vegetable juices. Low-sodium or reduced-sodium tomato sauce and paste. Low-sodium or reduced-sodium canned vegetables.  Fruits All fresh, canned (in natural juice), or frozen fruits. Meat and Other Protein Products Ground beef (85% or leaner), grass-fed beef, or beef trimmed of fat. Skinless chicken or turkey. Ground chicken or turkey. Pork trimmed of fat. All fish and seafood. Eggs. Dried beans, peas, or lentils. Unsalted nuts and seeds. Unsalted canned beans. Dairy Low-fat dairy products, such as skim or 1% milk, 2% or reduced-fat cheeses, low-fat ricotta or cottage cheese, or plain low-fat yogurt. Low-sodium or reduced-sodium cheeses. Fats and Oils Tub margarines without trans fats. Light or reduced-fat mayonnaise and salad dressings (reduced sodium). Avocado. Safflower, olive, or canola oils. Natural peanut or almond butter. Other Unsalted popcorn and pretzels. The items listed above may not be a complete list of recommended foods or beverages. Contact your dietitian for more options. WHAT FOODS ARE NOT RECOMMENDED? Grains White bread. White pasta. White rice. Refined cornbread. Bagels and croissants. Crackers that contain trans fat. Vegetables Creamed or fried vegetables. Vegetables in a cheese sauce. Regular canned vegetables. Regular canned tomato sauce and paste. Regular tomato and vegetable juices. Fruits Dried fruits. Canned fruit in light or heavy syrup. Fruit juice. Meat and Other Protein Products Fatty cuts of meat. Ribs, chicken wings, bacon, sausage, bologna, salami, chitterlings, fatback, hot dogs, bratwurst, and packaged luncheon meats. Salted nuts and seeds. Canned beans with salt. Dairy Whole or 2% milk, cream, half-and-half, and cream cheese. Whole-fat or sweetened yogurt. Full-fat   cheeses or blue cheese. Nondairy creamers and whipped toppings. Processed cheese, cheese spreads, or cheese  curds. Condiments Onion and garlic salt, seasoned salt, table salt, and sea salt. Canned and packaged gravies. Worcestershire sauce. Tartar sauce. Barbecue sauce. Teriyaki sauce. Soy sauce, including reduced sodium. Steak sauce. Fish sauce. Oyster sauce. Cocktail sauce. Horseradish. Ketchup and mustard. Meat flavorings and tenderizers. Bouillon cubes. Hot sauce. Tabasco sauce. Marinades. Taco seasonings. Relishes. Fats and Oils Butter, stick margarine, lard, shortening, ghee, and bacon fat. Coconut, palm kernel, or palm oils. Regular salad dressings. Other Pickles and olives. Salted popcorn and pretzels. The items listed above may not be a complete list of foods and beverages to avoid. Contact your dietitian for more information. WHERE CAN I FIND MORE INFORMATION? National Heart, Lung, and [employer] Institute: www.nhlbi.nih.gov/health/health-topics/topics/dash/   This information is not intended to replace advice given to you by your health care provider. Make sure you discuss any questions you have with your health care provider.   Document Released: 06/16/2011 Document Revised: 07/18/2014 Document Reviewed: 05/01/2013 Elsevier Interactive Patient Education 2016 Elsevier Inc[employer] Hypertension Hypertension, commonly called high blood pressure, is when the force of blood pumping through your arteries is too strong. Your arteries are the blood vessels that carry blood from your heart throughout your body. A blood pressure reading consists of a higher number over a lower number, such as 110/72. The higher number (systolic) is the pressure inside your arteries when your heart pumps. The lower number (diastolic) is the pressure inside your arteries when your heart relaxes. Ideally you want your blood pressure below 120/80. Hypertension forces your heart to work harder to pump blood. Your arteries may become narrow or stiff. Having untreated or uncontrolled hypertension can cause heart attack, stroke, kidney  disease, and other problems. RISK FACTORS Some risk factors for high blood pressure are controllable. Others are not.  Risk factors you cannot control include:   Race. You may be at higher risk if you are African American.  Age. Risk increases with age.  Gender. Men are at higher risk than women before age 45 years. After age 65, women are at higher risk than men. Risk factors you can control include:  Not getting enough exercise or physical activity.  Being overweight.  Getting too much fat, sugar, calories, or salt in your diet.  Drinking too much alcohol. SIGNS AND SYMPTOMS Hypertension does not usually cause signs or symptoms. Extremely high blood pressure (hypertensive crisis) may cause headache, anxiety, shortness of breath, and nosebleed. DIAGNOSIS To check if you have hypertension, your health care provider will measure your blood pressure while you are seated, with your arm held at the level of your heart. It should be measured at least twice using the same arm. Certain conditions can cause a difference in blood pressure between your right and left arms. A blood pressure reading that is higher than normal on one occasion does not mean that you need treatment. If it is not clear whether you have high blood pressure, you may be asked to return on a different day to have your blood pressure checked again. Or, you may be asked to monitor your blood pressure at home for 1 or more weeks. TREATMENT Treating high blood pressure includes making lifestyle changes and possibly taking medicine. Living a healthy lifestyle can help lower high blood pressure. You may need to change some of your habits. Lifestyle changes may include:  Following the DASH diet. This diet is high in fruits, vegetables, and whole grains.   It is low in salt, red meat, and added sugars.  Keep your sodium intake below 2,300 mg per day.  Getting at least 30-45 minutes of aerobic exercise at least 4 times per  week.  Losing weight if necessary.  Not smoking.  Limiting alcoholic beverages.  Learning ways to reduce stress. Your health care provider may prescribe medicine if lifestyle changes are not enough to get your blood pressure under control, and if one of the following is true:  You are 15-60 years of age and your systolic blood pressure is above 140.  You are 70 years of age or older, and your systolic blood pressure is above 150.  Your diastolic blood pressure is above 90.  You have diabetes, and your systolic blood pressure is over [id number] or your diastolic blood pressure is over 90.  You have kidney disease and your blood pressure is above 140/90.  You have heart disease and your blood pressure is above 140/90. Your personal target blood pressure may vary depending on your medical conditions, your age, and other factors. HOME CARE INSTRUCTIONS  Have your blood pressure rechecked as directed by your health care provider.   Take medicines only as directed by your health care provider. Follow the directions carefully. Blood pressure medicines must be taken as prescribed. The medicine does not work as well when you skip doses. Skipping doses also puts you at risk for problems.  Do not smoke.   Monitor your blood pressure at home as directed by your health care provider. SEEK MEDICAL CARE IF:   You think you are having a reaction to medicines taken.  You have recurrent headaches or feel dizzy.  You have swelling in your ankles.  You have trouble with your vision. SEEK IMMEDIATE MEDICAL CARE IF:  You develop a severe headache or confusion.  You have unusual weakness, numbness, or feel faint.  You have severe chest or abdominal pain.  You vomit repeatedly.  You have trouble breathing. MAKE SURE YOU:   Understand these instructions.  Will watch your condition.  Will get help right away if you are not doing well or get worse.   This information is not intended to  replace advice given to you by your health care provider. Make sure you discuss any questions you have with your health care provider.   Document Released: 06/27/2005 Document Revised: 11/11/2014 Document Reviewed: 04/19/2013 Elsevier Interactive Patient Education 2016 Elsevier Inc. Prediabetes Eating Plan Prediabetes--also called impaired glucose tolerance or impaired fasting glucose--is a condition that causes blood sugar (blood glucose) levels to be higher than normal. Following a healthy diet can help to keep prediabetes under control. It can also help to lower the risk of type 2 diabetes and heart disease, which are increased in people who have prediabetes. Along with regular exercise, a healthy diet:  Promotes weight loss.  Helps to control blood sugar levels.  Helps to improve the way that the body uses insulin. WHAT DO I NEED TO KNOW ABOUT THIS EATING PLAN?  Use the glycemic index (GI) to plan your meals. The index tells you how quickly a food will raise your blood sugar. Choose low-GI foods. These foods take a longer time to raise blood sugar.  Pay close attention to the amount of carbohydrates in the food that you eat. Carbohydrates increase blood sugar levels.  Keep track of how many calories you take in. Eating the right amount of calories will help you to achieve a healthy weight. Losing about 7 percent  of your starting weight can help to prevent type 2 diabetes.  You may want to follow a Mediterranean diet. This diet includes a lot of vegetables, lean meats or fish, whole grains, fruits, and healthy oils and fats. WHAT FOODS CAN I EAT? Grains Whole grains, such as whole-wheat or whole-grain breads, crackers, cereals, and pasta. Unsweetened oatmeal. Bulgur. Barley. Quinoa. Brown rice. Corn or whole-wheat flour tortillas or taco shells. Vegetables Lettuce. Spinach. Peas. Beets. Cauliflower. Cabbage. Broccoli. Carrots. Tomatoes. Squash. Eggplant. Herbs. Peppers. Onions.  Cucumbers. Brussels sprouts. Fruits Berries. Bananas. Apples. Oranges. Grapes. Papaya. Mango. Pomegranate. Kiwi. Grapefruit. Cherries. Meats and Other Protein Sources Seafood. Lean meats, such as chicken and [place] or lean cuts of pork and beef. Tofu. Eggs. Nuts. Beans. Dairy Low-fat or fat-free dairy products, such as yogurt, cottage cheese, and cheese. Beverages Water. Tea. Coffee. Sugar-free or diet soda. Seltzer water. Milk. Milk alternatives, such as soy or almond milk. Condiments Mustard. Relish. Low-fat, low-sugar ketchup. Low-fat, low-sugar barbecue sauce. Low-fat or fat-free mayonnaise. Sweets and Desserts Sugar-free or low-fat pudding. Sugar-free or low-fat ice cream and other frozen treats. Fats and Oils Avocado. Walnuts. Olive oil. The items listed above may not be a complete list of recommended foods or beverages. Contact your dietitian for more options.  WHAT FOODS ARE NOT RECOMMENDED? Grains Refined white flour and flour products, such as bread, pasta, snack foods, and cereals. Beverages Sweetened drinks, such as sweet iced tea and soda. Sweets and Desserts Baked goods, such as cake, cupcakes, pastries, cookies, and cheesecake. The items listed above may not be a complete list of foods and beverages to avoid. Contact your dietitian for more information.   This information is not intended to replace advice given to you by your health care provider. Make sure you discuss any questions you have with your health care provider.   Document Released: 11/11/2014 Document Reviewed: 11/11/2014 Elsevier Interactive Patient Education [employer].

## 2015-07-14 NOTE — Progress Notes (Signed)
Subjective:    Patient ID: Gregory [name], male    DOB: March 08, 1929, 82 y.o.   MRN: [id number]  HPI Gregory [name] an 82 year old male with a history of hypertension presents for a 3 month follow-up. Gregory [name] states that he has been exercising consistently and has been able to increase physical activity over the past several months. He states that he is adherent to a low sodium diet. Patient denies chest pain, dyspnea, lower extremity edema, orthopnea, palpitations, syncope and tachypnea.  Cardiovascular risk factors include: advanced age (older than 88 for men, 37 for women).   Past Medical History  Diagnosis Date  . Hypertension   . Enlarged prostate    Social History   Social History  . Marital Status: Divorced    Spouse Name: N/A  . Number of Children: N/A  . Years of Education: N/A   Occupational History  . Not on file.   Social History Main Topics  . Smoking status: Former Smoker    Types: Cigarettes  . Smokeless tobacco: Not on file  . Alcohol Use: No  . Drug Use: No  . Sexual Activity: Not on file   Other Topics Concern  . Not on file   Social History Narrative   Review of Systems  Constitutional: Negative for fatigue and unexpected weight change.  HENT: Negative.   Eyes: Negative.  Negative for photophobia and visual disturbance.  Respiratory: Negative.  Negative for chest tightness.   Cardiovascular: Negative.  Negative for chest pain and palpitations.  Gastrointestinal: Negative.   Endocrine: Negative for polydipsia, polyphagia and polyuria.  Genitourinary: Positive for dysuria (occasional dysuria). Negative for urgency and hematuria.  Musculoskeletal: Negative.   Skin: Negative.   Allergic/Immunologic: Negative for immunocompromised state.  Neurological: Negative for dizziness, syncope, light-headedness and headaches.  Hematological: Negative.   Psychiatric/Behavioral: Negative.        Objective:   Physical Exam  Constitutional: He is  oriented to person, place, and time. He appears well-developed and well-nourished.  HENT:  Head: Normocephalic and atraumatic.  Right Ear: External ear normal.  Left Ear: External ear normal.  Mouth/Throat: Oropharynx is clear and moist.  Eyes: EOM are normal. Pupils are equal, round, and reactive to light.  Neck: Normal range of motion. Neck supple.  Cardiovascular: Normal rate, regular rhythm, normal heart sounds and intact distal pulses.   Pulmonary/Chest: Effort normal and breath sounds normal.  Abdominal: Soft. Bowel sounds are normal.  Musculoskeletal: Normal range of motion.  Neurological: He is alert and oriented to person, place, and time. He has normal reflexes.  Skin: Skin is warm and dry.  Psychiatric: He has a normal mood and affect. His behavior is normal. Judgment and thought content normal.     BP 127/83 mmHg  Pulse 113  Temp(Src) 97.9 F (36.6 C) (Oral)  Resp 16  Ht 6\' 2"  (1.88 m)  Wt 200 lb (90.719 kg)  BMI 25.67 kg/m2 Assessment & Plan:   1. Essential hypertension Blood pressure is at goal on current medication regimen. Will continue on medications at current medication dosages.  - COMPLETE METABOLIC PANEL WITH GFR - POCT urinalysis dipstick - Ambulatory referral to Ophthalmology  2. Enlarged prostate Patient has a follow up appointment scheduled with [facility] on 07/28/2015. Will fax today's labs to urology - tamsulosin (FLOMAX) 0.4 MG CAPS capsule; Take 1 capsule (0.4 mg total) by mouth daily after supper.  Dispense: 90 capsule; Refill: 1  3. Hyperglycemia Previous hemoglobin a1C is 6.2,  which is consistent with prediabetes. He maintains that he has been following a balanced diet. He has increased his activity level. He states that he is very active with his family.  - Hemoglobin A1c   Routine health maintenance Will review records from [facility] as they become available, will re-send medical records request.   Will  schedule for a complete physical examination with fasting lipid panel and prostate examination in 3 months.   Patient reports that he has not had an eye examination since moving to [place], will send referral to opthalmology due to long history of hypertension.     RTC: 3 months for hypertension The patient was given clear instructions to go to ER or return to medical center if symptoms do not improve, worsen or new problems develop. The patient verbalized understanding. Will notify patient with laboratory results. Dorena Dew, FNP

## 2015-07-15 LAB — HEMOGLOBIN A1C
HEMOGLOBIN A1C: 6 % — AB (ref <5.7)
MEAN PLASMA GLUCOSE: 126 mg/dL — AB (ref <117)

## 2015-07-18 ENCOUNTER — Emergency Department (HOSPITAL_COMMUNITY)
Admission: EM | Admit: 2015-07-18 | Discharge: 2015-07-18 | Disposition: A | Discharge status: Home / Self Care | Payer: Medicare Other | Attending: Emergency Medicine | Admitting: Emergency Medicine

## 2015-07-18 ENCOUNTER — Encounter (HOSPITAL_COMMUNITY): Payer: Self-pay

## 2015-07-18 DIAGNOSIS — R1030 Lower abdominal pain, unspecified: Secondary | ICD-10-CM | POA: Diagnosis not present

## 2015-07-18 DIAGNOSIS — Z87891 Personal history of nicotine dependence: Secondary | ICD-10-CM | POA: Insufficient documentation

## 2015-07-18 DIAGNOSIS — N400 Enlarged prostate without lower urinary tract symptoms: Secondary | ICD-10-CM | POA: Insufficient documentation

## 2015-07-18 DIAGNOSIS — Z79899 Other long term (current) drug therapy: Secondary | ICD-10-CM | POA: Insufficient documentation

## 2015-07-18 DIAGNOSIS — I10 Essential (primary) hypertension: Secondary | ICD-10-CM | POA: Insufficient documentation

## 2015-07-18 DIAGNOSIS — R140 Abdominal distension (gaseous): Secondary | ICD-10-CM | POA: Diagnosis not present

## 2015-07-18 DIAGNOSIS — R39198 Other difficulties with micturition: Secondary | ICD-10-CM | POA: Diagnosis not present

## 2015-07-18 DIAGNOSIS — R339 Retention of urine, unspecified: Secondary | ICD-10-CM | POA: Insufficient documentation

## 2015-07-18 LAB — URINALYSIS, ROUTINE W REFLEX MICROSCOPIC
BILIRUBIN URINE: NEGATIVE
GLUCOSE, UA: NEGATIVE mg/dL
KETONES UR: NEGATIVE mg/dL
LEUKOCYTES UA: NEGATIVE
Nitrite: NEGATIVE
PROTEIN: NEGATIVE mg/dL
Specific Gravity, Urine: 1.007 (ref 1.005–1.030)
pH: 6.5 (ref 5.0–8.0)

## 2015-07-18 LAB — URINE MICROSCOPIC-ADD ON
Bacteria, UA: NONE SEEN
WBC, UA: NONE SEEN WBC/hpf (ref 0–5)

## 2015-07-18 LAB — I-STAT CHEM 8, ED
BUN: 18 mg/dL (ref 6–20)
CALCIUM ION: 1.18 mmol/L (ref 1.13–1.30)
Chloride: 94 mmol/L — ABNORMAL LOW (ref 101–111)
Creatinine, Ser: 1 mg/dL (ref 0.61–1.24)
GLUCOSE: 123 mg/dL — AB (ref 65–99)
HCT: 44 % (ref 39.0–52.0)
HEMOGLOBIN: 15 g/dL (ref 13.0–17.0)
Potassium: 3.8 mmol/L (ref 3.5–5.1)
Sodium: 135 mmol/L (ref 135–145)
TCO2: 29 mmol/L (ref 0–100)

## 2015-07-18 NOTE — Discharge Instructions (Signed)
Acute Urinary Retention, Male °Acute urinary retention is the temporary inability to urinate. °This is a common problem in older men. As men age their prostates become larger and block the flow of urine from the bladder. This is usually a problem that has come on gradually.  °HOME CARE INSTRUCTIONS °If you are sent home with a Foley catheter and a drainage system, you will need to discuss the best course of action with your health care provider. While the catheter is in, maintain a good intake of fluids. Keep the drainage bag emptied and lower than your catheter. This is so that contaminated urine will not flow back into your bladder, which could lead to a urinary tract infection. °There are two main types of drainage bags. One is a large bag that usually is used at night. It has a good capacity that will allow you to sleep through the night without having to empty it. The second type is called a leg bag. It has a smaller capacity, so it needs to be emptied more frequently. However, the main advantage is that it can be attached by a leg strap and can go underneath your clothing, allowing you the freedom to move about or leave your home. °Only take over-the-counter or prescription medicines for pain, discomfort, or fever as directed by your health care provider.  °SEEK MEDICAL CARE IF: °· You develop a low-grade fever. °· You experience spasms or leakage of urine with the spasms. °SEEK IMMEDIATE MEDICAL CARE IF:  °· You develop chills or fever. °· Your catheter stops draining urine. °· Your catheter falls out. °· You start to develop increased bleeding that does not respond to rest and increased fluid intake. °MAKE SURE YOU: °· Understand these instructions. °· Will watch your condition. °· Will get help right away if you are not doing well or get worse. °  °This information is not intended to replace advice given to you by your health care provider. Make sure you discuss any questions you have with your health care  provider. °  °Document Released: 10/03/2000 Document Revised: 11/11/2014 Document Reviewed: 12/06/2012 °Elsevier Interactive Patient Education ©2016 Elsevier Inc. ° °

## 2015-07-18 NOTE — ED Notes (Signed)
Per EMS, pt from home unable to urinate all day- pain in groin 10/10, abd hard on palpation. Hx of  BPH and HTN.

## 2015-07-18 NOTE — ED Provider Notes (Signed)
CSN: [id number]     Arrival date & time 07/18/15  1957 History   First MD Initiated Contact with Patient 07/18/15 2019     Chief Complaint  Patient presents with  . Urinary Retention     (Consider location/radiation/quality/duration/timing/severity/associated sxs/prior Treatment) HPI Patient presents with urinary retention since earlier this morning. He has a full sensation in his suprapubic region. States that prior to retention he was having to strain to urinate. States he normally takes Flomax and this relieves his symptoms. He's had no nausea or vomiting. Denies any hematuria. No fever or chills. Denies any flank pain. No new medications. Past Medical History  Diagnosis Date  . Hypertension   . Enlarged prostate    No past surgical history on file. No family history on file. Social History  Substance Use Topics  . Smoking status: Former Smoker    Types: Cigarettes  . Smokeless tobacco: None  . Alcohol Use: No    Review of Systems  Constitutional: Negative for fever and chills.  Respiratory: Negative for shortness of breath.   Cardiovascular: Negative for chest pain.  Gastrointestinal: Positive for abdominal pain and abdominal distention. Negative for nausea, vomiting, diarrhea and constipation.  Genitourinary: Positive for difficulty urinating. Negative for dysuria, frequency, hematuria, flank pain, penile pain and testicular pain.  Musculoskeletal: Negative for back pain and neck pain.  Skin: Negative for rash and wound.  Neurological: Negative for dizziness, weakness, light-headedness, numbness and headaches.  All other systems reviewed and are negative.     Allergies  Review of patient's allergies indicates no known allergies.  Home Medications   Prior to Admission medications   Medication Sig Start Date End Date Taking? Authorizing Provider  atenolol (TENORMIN) 100 MG tablet Take 1 tablet (100 mg total) by mouth daily. 04/22/15  Yes Dorena Dew, FNP   finasteride (PROSCAR) 5 MG tablet Take 1 tablet (5 mg total) by mouth daily. 05/07/15  Yes Dorena Dew, FNP  hydrochlorothiazide (HYDRODIURIL) 25 MG tablet TAKE 1 TABLET BY MOUTH DAILY 05/11/15  Yes Dorena Dew, FNP  Multiple Vitamins-Minerals (MULTIVITAMIN WITH MINERALS) tablet Take 1 tablet by mouth daily.   Yes Historical Provider, MD  tamsulosin (FLOMAX) 0.4 MG CAPS capsule Take 1 capsule (0.4 mg total) by mouth daily after supper. 07/14/15  Yes Dorena Dew, FNP   BP 117/71 mmHg  Pulse 75  Temp(Src) 97.9 F (36.6 C) (Rectal)  Resp 21  SpO2 100% Physical Exam  Constitutional: He is oriented to person, place, and time. He appears well-developed and well-nourished. No distress.  Appears uncomfortable  HENT:  Head: Normocephalic and atraumatic.  Mouth/Throat: Oropharynx is clear and moist.  Eyes: EOM are normal. Pupils are equal, round, and reactive to light.  Neck: Normal range of motion. Neck supple.  Cardiovascular: Normal rate and regular rhythm.   Pulmonary/Chest: Effort normal and breath sounds normal. No respiratory distress. He has no wheezes. He has no rales.  Abdominal: Soft. Bowel sounds are normal. He exhibits distension. He exhibits no mass. There is tenderness (suprapubic fullness and tenderness with palpation.). There is no rebound and no guarding.  Musculoskeletal: Normal range of motion. He exhibits no edema or tenderness.  Neurological: He is alert and oriented to person, place, and time.  Skin: Skin is warm and dry. No rash noted. No erythema.  Psychiatric: He has a normal mood and affect. His behavior is normal.  Nursing note and vitals reviewed.   ED Course  Procedures (including critical care time) Labs  Review Labs Reviewed  URINALYSIS, ROUTINE W REFLEX MICROSCOPIC (NOT AT [facility]) - Abnormal; Notable for the following:    Hgb urine dipstick TRACE (*)    All other components within normal limits  URINE MICROSCOPIC-ADD ON - Abnormal; Notable for  the following:    Squamous Epithelial / LPF 0-5 (*)    All other components within normal limits  I-STAT CHEM 8, ED - Abnormal; Notable for the following:    Chloride 94 (*)    Glucose, Bld 123 (*)    All other components within normal limits    Imaging Review No results found. I have personally reviewed and evaluated these images and lab results as part of my medical decision-making.   EKG Interpretation None      MDM   Final diagnoses:  Urinary retention   Patient is feeling much better after Foley catheter placement.  No evidence of urinary tract infection or renal dysfunction. Patient is advised to follow-up with urology. Return precautions given.   Julianne Rice, MD 07/18/15 2132

## 2015-07-27 HISTORY — PX: PROSTATE BIOPSY: SHX241

## 2015-08-04 ENCOUNTER — Other Ambulatory Visit: Payer: Self-pay | Admitting: Urology

## 2015-08-04 DIAGNOSIS — C61 Malignant neoplasm of prostate: Secondary | ICD-10-CM

## 2015-08-09 ENCOUNTER — Other Ambulatory Visit: Payer: Self-pay | Admitting: Family Medicine

## 2015-08-12 ENCOUNTER — Encounter (HOSPITAL_COMMUNITY)
Admission: RE | Admit: 2015-08-12 | Discharge: 2015-08-12 | Disposition: A | Discharge status: Home / Self Care | Payer: Medicare Other | Source: Ambulatory Visit | Attending: Urology | Admitting: Urology

## 2015-08-12 DIAGNOSIS — C61 Malignant neoplasm of prostate: Secondary | ICD-10-CM | POA: Diagnosis not present

## 2015-08-12 MED ORDER — TECHNETIUM TC 99M MEDRONATE IV KIT
25.0000 | PACK | Freq: Once | INTRAVENOUS | Status: AC | PRN
  Administered 2015-08-12: 25 via INTRAVENOUS

## 2015-08-18 ENCOUNTER — Other Ambulatory Visit: Payer: Self-pay | Admitting: Urology

## 2015-08-18 NOTE — Progress Notes (Signed)
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 seminal vesicle invasion, perineural invasion, and extraprostatic extension  Sites of Visceral and Bony Metastatic Disease: "Diffuse osseous metastatic disease of bilateral Humeri, cervical thoracic and lumbar veterbral bodies, ribs, sternum, pelvis and femurs bilaterally with significant involvementof the right proximal femur  Location(s) of Symptomatic Metastases: Right Proximal Femur most significant  Past/Anticipated chemotherapy by medical oncology, if any: Unknown  Pain on a scale of 0-10 is: Denies pain at this time and states "none in weeks"  If Spine Met(s), symptoms, if any, include:  Bowel/Bladder retention or incontinence : Hx of urinary retention - Foley Catheter x 3-4 weeks., Plan for a TURP. On Finasteride, Casodex and Tamsulosin  Numbness or weakness in extremities (please describe): None per patient account. States that since taking Casodex he was fatigue but "feels much better now."  Current Decadron regimen, if applicable: N/A  Ambulatory status? Walker? Wheelchair?: Ambulatory  SAFETY ISSUES:  Prior radiation? No  Pacemaker/ICD? No  Possible current pregnancy? N/A   Is the patient on methotrexate? No  Current Complaints / other details:

## 2015-08-19 ENCOUNTER — Encounter: Payer: Self-pay | Admitting: Radiation Oncology

## 2015-08-20 ENCOUNTER — Encounter: Payer: Self-pay | Admitting: Radiation Oncology

## 2015-08-20 ENCOUNTER — Ambulatory Visit
Admission: RE | Admit: 2015-08-20 | Discharge: 2015-08-20 | Disposition: A | Discharge status: Home / Self Care | Payer: Medicare Other | Source: Ambulatory Visit | Attending: Radiation Oncology | Admitting: Radiation Oncology

## 2015-08-20 VITALS — BP 139/78 | HR 82 | Temp 97.7°F | Ht 74.0 in | Wt 200.8 lb

## 2015-08-20 DIAGNOSIS — C7951 Secondary malignant neoplasm of bone: Secondary | ICD-10-CM | POA: Insufficient documentation

## 2015-08-20 DIAGNOSIS — Z9889 Other specified postprocedural states: Secondary | ICD-10-CM | POA: Insufficient documentation

## 2015-08-20 DIAGNOSIS — Z87891 Personal history of nicotine dependence: Secondary | ICD-10-CM | POA: Insufficient documentation

## 2015-08-20 DIAGNOSIS — C61 Malignant neoplasm of prostate: Secondary | ICD-10-CM | POA: Insufficient documentation

## 2015-08-20 DIAGNOSIS — Z79899 Other long term (current) drug therapy: Secondary | ICD-10-CM | POA: Insufficient documentation

## 2015-08-20 DIAGNOSIS — N400 Enlarged prostate without lower urinary tract symptoms: Secondary | ICD-10-CM | POA: Insufficient documentation

## 2015-08-20 HISTORY — DX: Presence of other specified devices: Z97.8

## 2015-08-20 HISTORY — DX: Malignant neoplasm of prostate: C61

## 2015-08-20 HISTORY — DX: Presence of urogenital implants: Z96.0

## 2015-08-20 HISTORY — DX: Secondary malignant neoplasm of bone: C79.51

## 2015-08-20 NOTE — Progress Notes (Signed)
Radiation Oncology         [phone] ________________________________  Initial outpatient Consultation  Name: Gregory [name] MRN: [id number]  Date: 08/20/2015  DOB: [birth year]  [id number] VINCENT, MD  Franchot Gallo, MD   REFERRING PHYSICIAN: Franchot Gallo, MD  DIAGNOSIS: The encounter diagnosis was Metastatic adenocarcinoma to bone [facility]).    ICD-9-CM ICD-10-CM   1. 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 male seen at the request of Dr. Romilda Garret with a newly diagnosed prostate cancer with multiple skeletal metastases. The patient has a history of benign prostatic hypertrophy, and has been on Finesteride and Tamsulosin for many years. He lived in [place] for many years but relocated to [place] in the past 5 years. He recalls having a PSA that was "borderline" in about 2010, but did not have repeat levels drawn until relocating to [place]. His first since about 2010 was performed by his PCP in October 2016 at which time his PSA was 18.74. He underwent a prostatic biopsy on 07/27/2015, and 12 of the 12 specimens contained adenocarcinoma with a Gleason Score of 4+5 with extraprostatic extension and perinural invasion. Apparently he has had  urinary retention and has an indwelling Foley catheter for the last 3-4 weeks after presenting to the ED with retention.  A CT scan of the abdomen and pelvis did not reveal any visceral metastases when performed on 08/12/2015. His bone scan however reveals diffuse osseous metastatic disease involving the axial and appendicular skeleton most intense uptake at the proximal right femur. Other sites includes both humerus, sternum, ribs, spine, pelvis and proximal femur. He was started on Casodex by Dr. Romilda Garret, and has plans to undergo TURP with bilateral orchiectomy next week, and comes to discuss the role of radiotherapy.  PREVIOUS RADIATION THERAPY: No  PAST MEDICAL HISTORY:     Past Medical History  Diagnosis Date  . Hypertension   . Enlarged prostate   . Prostate cancer ([place]) 07/27/2015  . Bone metastases ([place])   . Metastatic cancer ([place])   . Foley catheter in place     Urinary Retension     PAST SURGICAL HISTORY: Past Surgical History  Procedure Laterality Date  . Prostate biopsy  1/16 /2017    FAMILY HISTORY: family history includes Other (age of onset: 75) in his father; Tuberculosis (age of onset: 71) in his mother.  SOCIAL HISTORY:  Social History   Social History  . Marital Status: Divorced    Spouse Name: N/A  . Number of Children: N/A  . Years of Education: N/A   Occupational History  . Retired    Social History Main Topics  . Smoking status: Former Smoker    Types: Cigarettes    Start date: 07/11/1992  . Smokeless tobacco: Not on file  . Alcohol Use: No  . Drug Use: No  . Sexual Activity: Not on file   Other Topics Concern  . Not on file   Social History Narrative    ALLERGIES: Review of patient's allergies indicates no known allergies.  MEDICATIONS:  Current Outpatient Prescriptions  Medication Sig Dispense Refill  . atenolol (TENORMIN) 100 MG tablet TAKE 1 TABLET BY MOUTH EVERY DAY 30 tablet 3  . bicalutamide (CASODEX) 50 MG tablet     . finasteride (PROSCAR) 5 MG tablet Take 1 tablet (5 mg total) by mouth daily. 30 tablet 3  . hydrochlorothiazide (HYDRODIURIL) 25 MG tablet TAKE 1 TABLET BY  MOUTH DAILY 30 tablet 4  . Multiple Vitamins-Minerals (MULTIVITAMIN WITH MINERALS) tablet Take 1 tablet by mouth daily.    . tamsulosin (FLOMAX) 0.4 MG CAPS capsule Take 1 capsule (0.4 mg total) by mouth daily after supper. 90 capsule 1   No current facility-administered medications for this encounter.    REVIEW OF SYSTEMS:  On review of systems the patient reports that overall since starting He is feeling quite well. He states that he has plans to undergo surgery next Thursday. He denies any difficulty with bowel function but  states that for several weeks he was having episodes of incomplete sense of voiding prior to presenting to the emergency department. He states that he is the same catheter Was placed in the ED, and states that this is functioning quite well. He denies any fevers or chills, flank pain, abdominal pain, nausea or vomiting. He denies any pain in his hips, ribs, spine, or any other musculoskeletal sites. His daughter states that he was starting to become slower when walking around over the past couple of months however since starting the Casodex, his energy is ability to walk are normal he seems to also be improved. A complete review of systems is obtained and is otherwise negative.   PHYSICAL EXAM:  height is 6\' 2"  (1.88 m) and weight is 200 lb 12.8 oz (91.082 kg). His temperature is 97.7 F (36.5 C). His blood pressure is 139/78 and his pulse is 82.   Pain Scale 0/10  In general this is a well-appearing African-American male who appears younger than his stated age. He is in no acute distress. He is alert and oriented 4 and appropriate throughout the examination. Cardiovascular exam reveals a regular rate and rhythm, no clicks rubs or murmurs are chest is clear to auscultation bilaterally. No palpable adenopathy is noted on lymphatic review in the cervical, supraclavicular, or axillary chains the abdomen is intact without evidence of abnormality and bowel sounds are auscultated in all quadrants. The abdomen is otherwise soft, nontender, nondistended. Lower extremities reveal trace pitting edema of the dorsal aspect of his feet 2 approximately 7 cm up the tibial tuberosity  KPS = 90  100 - Normal; no complaints; no evidence of disease. 90   - Able to carry on normal activity; minor signs or symptoms of disease. 80   - Normal activity with effort; some signs or symptoms of disease. 45   - Cares for self; unable to carry on normal activity or to do active work. 60   - Requires occasional assistance, but is  able to care for most of his personal needs. 50   - Requires considerable assistance and frequent medical care. 48   - Disabled; requires special care and assistance. 16   - Severely disabled; hospital admission is indicated although death not imminent. 34   - Very sick; hospital admission necessary; active supportive treatment necessary. 10   - Moribund; fatal processes progressing rapidly. 0     - Dead  Karnofsky DA, Abelmann [place], Craver LS and Burchenal [facility] [phone]) The use of the nitrogen mustards in the palliative treatment of carcinoma: with particular reference to bronchogenic carcinoma Cancer 1 634-56  LABORATORY DATA:  Lab Results  Component Value Date   WBC 10.1 04/01/2015   HGB 15.0 07/18/2015   HCT 44.0 07/18/2015   MCV 89.3 04/01/2015   PLT 187 04/01/2015   Lab Results  Component Value Date   NA 135 07/18/2015   K 3.8 07/18/2015   CL  94* 07/18/2015   CO2 30 07/14/2015   Lab Results  Component Value Date   ALT 11 07/14/2015   AST 25 07/14/2015   ALKPHOS 263* 07/14/2015   BILITOT 0.8 07/14/2015     RADIOGRAPHY: Nm Bone Scan Whole Body  08/12/2015  CLINICAL DATA:  Prostate cancer. EXAM: NUCLEAR MEDICINE WHOLE BODY BONE SCAN TECHNIQUE: Whole body anterior and posterior images were obtained approximately 3 hours after intravenous injection of radiopharmaceutical. RADIOPHARMACEUTICALS:  26 mCi Technetium-49m MDP IV COMPARISON:  CT abdomen pelvis 08/12/2015. FINDINGS: Numerous foci of increased uptake are seen in the humeri, sternum, ribs, spine, pelvis and proximal femora. Many of these foci of uptake correspond to sclerotic lesions on CT performed today. Uptake is most intense in the proximal right femur. IMPRESSION: Diffuse osseous metastatic disease involving the axial and appendicular skeleton. Uptake is most intense in the proximal right femur. As suggested on CT performed earlier today, patient is likely at increased risk for pathologic fracture. Electronically Signed   By:  Lorin Picket M.D.   On: 08/12/2015 13:23      IMPRESSION: Stage II B adenocarcinoma of the prostate with a Gleason score of 4+5, PSA of 18, with metastatic disease to the axial and appendicular skeleton with particular intensity on bone scan noted in the right proximal femur.  PLAN: The patient is counseled on the findings his advanced prostate cancer. Dr. Tammi Klippel discusses the role of radiotherapy to palliate and protect his right proximal femur. His daughter requested we proceed with simulation today begin his treatment while he is hospitalized next week after observation for his TURP with orchiectomy. We will proceed on 08/28/2015 at 9:30 in the morning. An appointment card was given to the patient and his daughter for this. The risks, benefits, and side effect profile of radiotherapy was discussed. At the end of the conversational questions were answered to satisfaction of the patient. He understands the role for radiation, and that his care will be closely correlated between [place] and urology.  The above documentation reflects my direct findings during this shared patient visit. Please see the separate note by Dr. Tammi Klippel on this date for the remainder of the patient's plan of care.  Carola Rhine, PAC

## 2015-08-21 DIAGNOSIS — C7951 Secondary malignant neoplasm of bone: Secondary | ICD-10-CM | POA: Insufficient documentation

## 2015-08-21 NOTE — Patient Instructions (Signed)
Contact our office if you have any questions following today's appointment: [phone].  

## 2015-08-24 ENCOUNTER — Encounter (HOSPITAL_BASED_OUTPATIENT_CLINIC_OR_DEPARTMENT_OTHER): Payer: Self-pay | Admitting: *Deleted

## 2015-08-24 NOTE — Progress Notes (Signed)
NPO AFTER MN WITH EXCEPTION CLEAR LIQUIDS UNTIL 0700 (NO CREAM/ MILK PRODUCTS).  ARRIVE AT 1145.  NEEDS ISTAT. CURRENT EKG IN CHART AND EPIC.  WILL TAKE AM MEDS W/ SIPS OF WATER, WITH EXCEPTION NO HCTZ.   WILL CALL DAUGHTER W/ INSTRUCTIONS PER PT REQUEST.

## 2015-08-27 ENCOUNTER — Ambulatory Visit (HOSPITAL_BASED_OUTPATIENT_CLINIC_OR_DEPARTMENT_OTHER): Payer: Medicare Other | Admitting: Anesthesiology

## 2015-08-27 ENCOUNTER — Encounter (HOSPITAL_BASED_OUTPATIENT_CLINIC_OR_DEPARTMENT_OTHER): Payer: Self-pay | Admitting: *Deleted

## 2015-08-27 ENCOUNTER — Encounter (HOSPITAL_COMMUNITY)
Admission: RE | Disposition: A | Discharge status: Home / Self Care | Payer: Self-pay | Source: Ambulatory Visit | Attending: Urology

## 2015-08-27 ENCOUNTER — Ambulatory Visit (HOSPITAL_COMMUNITY)
Admission: RE | Admit: 2015-08-27 | Discharge: 2015-08-28 | Disposition: A | Discharge status: Home / Self Care | Payer: Medicare Other | Source: Ambulatory Visit | Attending: Urology | Admitting: Urology

## 2015-08-27 DIAGNOSIS — Z87891 Personal history of nicotine dependence: Secondary | ICD-10-CM | POA: Insufficient documentation

## 2015-08-27 DIAGNOSIS — K219 Gastro-esophageal reflux disease without esophagitis: Secondary | ICD-10-CM | POA: Insufficient documentation

## 2015-08-27 DIAGNOSIS — C7951 Secondary malignant neoplasm of bone: Secondary | ICD-10-CM

## 2015-08-27 DIAGNOSIS — R739 Hyperglycemia, unspecified: Secondary | ICD-10-CM | POA: Diagnosis present

## 2015-08-27 DIAGNOSIS — I10 Essential (primary) hypertension: Secondary | ICD-10-CM

## 2015-08-27 DIAGNOSIS — E872 Acidosis: Secondary | ICD-10-CM | POA: Diagnosis present

## 2015-08-27 DIAGNOSIS — Z79899 Other long term (current) drug therapy: Secondary | ICD-10-CM

## 2015-08-27 DIAGNOSIS — C61 Malignant neoplasm of prostate: Secondary | ICD-10-CM | POA: Insufficient documentation

## 2015-08-27 DIAGNOSIS — N183 Chronic kidney disease, stage 3 (moderate): Secondary | ICD-10-CM | POA: Diagnosis present

## 2015-08-27 DIAGNOSIS — N029 Recurrent and persistent hematuria with unspecified morphologic changes: Secondary | ICD-10-CM | POA: Diagnosis not present

## 2015-08-27 DIAGNOSIS — N179 Acute kidney failure, unspecified: Secondary | ICD-10-CM | POA: Diagnosis present

## 2015-08-27 DIAGNOSIS — R338 Other retention of urine: Secondary | ICD-10-CM | POA: Diagnosis present

## 2015-08-27 DIAGNOSIS — E875 Hyperkalemia: Secondary | ICD-10-CM | POA: Diagnosis present

## 2015-08-27 DIAGNOSIS — D62 Acute posthemorrhagic anemia: Secondary | ICD-10-CM | POA: Diagnosis not present

## 2015-08-27 DIAGNOSIS — N320 Bladder-neck obstruction: Secondary | ICD-10-CM

## 2015-08-27 DIAGNOSIS — I82811 Embolism and thrombosis of superficial veins of right lower extremities: Secondary | ICD-10-CM | POA: Diagnosis present

## 2015-08-27 DIAGNOSIS — R0902 Hypoxemia: Secondary | ICD-10-CM | POA: Diagnosis present

## 2015-08-27 DIAGNOSIS — I5033 Acute on chronic diastolic (congestive) heart failure: Secondary | ICD-10-CM | POA: Diagnosis not present

## 2015-08-27 DIAGNOSIS — N138 Other obstructive and reflux uropathy: Secondary | ICD-10-CM | POA: Diagnosis present

## 2015-08-27 DIAGNOSIS — I130 Hypertensive heart and chronic kidney disease with heart failure and stage 1 through stage 4 chronic kidney disease, or unspecified chronic kidney disease: Secondary | ICD-10-CM | POA: Diagnosis present

## 2015-08-27 DIAGNOSIS — E870 Hyperosmolality and hypernatremia: Secondary | ICD-10-CM | POA: Diagnosis present

## 2015-08-27 DIAGNOSIS — I248 Other forms of acute ischemic heart disease: Secondary | ICD-10-CM | POA: Diagnosis not present

## 2015-08-27 HISTORY — DX: Gastro-esophageal reflux disease without esophagitis: K21.9

## 2015-08-27 HISTORY — PX: TRANSURETHRAL RESECTION OF PROSTATE: SHX73

## 2015-08-27 HISTORY — PX: ORCHIECTOMY: SHX2116

## 2015-08-27 HISTORY — DX: Benign prostatic hyperplasia without lower urinary tract symptoms: N40.0

## 2015-08-27 LAB — POCT I-STAT 4, (NA,K, GLUC, HGB,HCT)
Glucose, Bld: 126 mg/dL — ABNORMAL HIGH (ref 65–99)
HCT: 44 % (ref 39.0–52.0)
Hemoglobin: 15 g/dL (ref 13.0–17.0)
Potassium: 4.4 mmol/L (ref 3.5–5.1)
Sodium: 141 mmol/L (ref 135–145)

## 2015-08-27 SURGERY — TRANSURETHRAL RESECTION OF THE PROSTATE WITH GYRUS INSTRUMENTS
Anesthesia: General

## 2015-08-27 MED ORDER — ONDANSETRON HCL 4 MG/2ML IJ SOLN
INTRAMUSCULAR | Status: DC | PRN
  Administered 2015-08-27: 4 mg via INTRAVENOUS

## 2015-08-27 MED ORDER — HYDROCODONE-ACETAMINOPHEN 5-325 MG PO TABS: 1.0000 | ORAL_TABLET | ORAL | Status: DC | PRN

## 2015-08-27 MED ORDER — CEFAZOLIN SODIUM 1-5 GM-% IV SOLN
1.0000 g | INTRAVENOUS | Status: AC
  Administered 2015-08-27: 2 g via INTRAVENOUS
  Filled 2015-08-27: qty 50

## 2015-08-27 MED ORDER — PHENYLEPHRINE HCL 10 MG/ML IJ SOLN
INTRAMUSCULAR | Status: DC | PRN
  Administered 2015-08-27 (×4): 10 ug via INTRAVENOUS

## 2015-08-27 MED ORDER — ONDANSETRON HCL 4 MG/2ML IJ SOLN
4.0000 mg | Freq: Once | INTRAMUSCULAR | Status: DC | PRN
  Filled 2015-08-27: qty 2

## 2015-08-27 MED ORDER — SODIUM CHLORIDE 0.45 % IV SOLN
INTRAVENOUS | Status: DC
  Administered 2015-08-27 – 2015-08-28 (×2): via INTRAVENOUS

## 2015-08-27 MED ORDER — PHENYLEPHRINE HCL 10 MG/ML IJ SOLN
INTRAMUSCULAR | Status: AC
  Filled 2015-08-27: qty 1

## 2015-08-27 MED ORDER — SULFAMETHOXAZOLE-TRIMETHOPRIM 800-160 MG PO TABS
1.0000 | ORAL_TABLET | Freq: Two times a day (BID) | ORAL | Status: DC
  Administered 2015-08-27 – 2015-08-28 (×2): 1 via ORAL
  Filled 2015-08-27 (×2): qty 1

## 2015-08-27 MED ORDER — ONDANSETRON HCL 4 MG/2ML IJ SOLN
INTRAMUSCULAR | Status: AC
  Filled 2015-08-27: qty 2

## 2015-08-27 MED ORDER — PROPOFOL 10 MG/ML IV BOLUS
INTRAVENOUS | Status: AC
  Filled 2015-08-27: qty 20

## 2015-08-27 MED ORDER — FENTANYL CITRATE (PF) 100 MCG/2ML IJ SOLN
INTRAMUSCULAR | Status: DC | PRN
  Administered 2015-08-27 (×2): 25 ug via INTRAVENOUS

## 2015-08-27 MED ORDER — FENTANYL CITRATE (PF) 100 MCG/2ML IJ SOLN
25.0000 ug | INTRAMUSCULAR | Status: DC | PRN
Start: 2015-08-27 — End: 2015-08-27
  Filled 2015-08-27: qty 1

## 2015-08-27 MED ORDER — PHENYLEPHRINE HCL 10 MG/ML IJ SOLN
10.0000 mg | INTRAVENOUS | Status: DC | PRN
  Administered 2015-08-27: 50 ug/min via INTRAVENOUS

## 2015-08-27 MED ORDER — DEXAMETHASONE SODIUM PHOSPHATE 10 MG/ML IJ SOLN
INTRAMUSCULAR | Status: AC
  Filled 2015-08-27: qty 1

## 2015-08-27 MED ORDER — ADULT MULTIVITAMIN W/MINERALS CH
1.0000 | ORAL_TABLET | Freq: Every morning | ORAL | Status: DC
  Administered 2015-08-28: 1 via ORAL
  Filled 2015-08-27: qty 1

## 2015-08-27 MED ORDER — FENTANYL CITRATE (PF) 100 MCG/2ML IJ SOLN
INTRAMUSCULAR | Status: AC
  Filled 2015-08-27: qty 2

## 2015-08-27 MED ORDER — DEXAMETHASONE SODIUM PHOSPHATE 4 MG/ML IJ SOLN
INTRAMUSCULAR | Status: DC | PRN
  Administered 2015-08-27: 10 mg via INTRAVENOUS

## 2015-08-27 MED ORDER — TAMSULOSIN HCL 0.4 MG PO CAPS
0.4000 mg | ORAL_CAPSULE | Freq: Every day | ORAL | Status: DC
  Filled 2015-08-27: qty 1

## 2015-08-27 MED ORDER — ONDANSETRON HCL 4 MG/2ML IJ SOLN: 4.0000 mg | INTRAMUSCULAR | Status: DC | PRN

## 2015-08-27 MED ORDER — BUPIVACAINE HCL (PF) 0.25 % IJ SOLN
INTRAMUSCULAR | Status: AC
  Filled 2015-08-27: qty 30

## 2015-08-27 MED ORDER — ACETAMINOPHEN 325 MG PO TABS: 650.0000 mg | ORAL_TABLET | ORAL | Status: DC | PRN

## 2015-08-27 MED ORDER — PHENYLEPHRINE 40 MCG/ML (10ML) SYRINGE FOR IV PUSH (FOR BLOOD PRESSURE SUPPORT)
PREFILLED_SYRINGE | INTRAVENOUS | Status: AC
  Filled 2015-08-27: qty 10

## 2015-08-27 MED ORDER — HYDROCHLOROTHIAZIDE 25 MG PO TABS
25.0000 mg | ORAL_TABLET | Freq: Every day | ORAL | Status: DC
  Administered 2015-08-28: 25 mg via ORAL
  Filled 2015-08-27 (×2): qty 1

## 2015-08-27 MED ORDER — BELLADONNA ALKALOIDS-OPIUM 16.2-60 MG RE SUPP
RECTAL | Status: AC
  Filled 2015-08-27: qty 1

## 2015-08-27 MED ORDER — STERILE WATER FOR IRRIGATION IR SOLN
Status: DC | PRN
  Administered 2015-08-27: 500 mL

## 2015-08-27 MED ORDER — LIDOCAINE HCL 2 % EX GEL
CUTANEOUS | Status: AC
  Filled 2015-08-27: qty 5

## 2015-08-27 MED ORDER — SODIUM CHLORIDE 0.9 % IR SOLN
3000.0000 mL | Status: DC
  Administered 2015-08-27: 3000 mL
  Filled 2015-08-27: qty 3000

## 2015-08-27 MED ORDER — SODIUM CHLORIDE 0.9 % IR SOLN
Status: DC | PRN
  Administered 2015-08-27 (×4): 3000 mL via INTRAVESICAL
  Administered 2015-08-27: 500 mL

## 2015-08-27 MED ORDER — DOCUSATE SODIUM 100 MG PO CAPS
100.0000 mg | ORAL_CAPSULE | Freq: Two times a day (BID) | ORAL | Status: DC
  Administered 2015-08-27 – 2015-08-28 (×2): 100 mg via ORAL
  Filled 2015-08-27 (×2): qty 1

## 2015-08-27 MED ORDER — LIDOCAINE HCL (CARDIAC) 20 MG/ML IV SOLN
INTRAVENOUS | Status: AC
  Filled 2015-08-27: qty 5

## 2015-08-27 MED ORDER — CEFAZOLIN SODIUM-DEXTROSE 2-3 GM-% IV SOLR
INTRAVENOUS | Status: AC
  Filled 2015-08-27: qty 50

## 2015-08-27 MED ORDER — PROPOFOL 10 MG/ML IV BOLUS
INTRAVENOUS | Status: DC | PRN
  Administered 2015-08-27: 50 mg via INTRAVENOUS
  Administered 2015-08-27: 100 mg via INTRAVENOUS

## 2015-08-27 MED ORDER — OXYBUTYNIN CHLORIDE 5 MG PO TABS: 5.0000 mg | ORAL_TABLET | Freq: Three times a day (TID) | ORAL | Status: DC | PRN

## 2015-08-27 MED ORDER — BUPIVACAINE HCL (PF) 0.25 % IJ SOLN
INTRAMUSCULAR | Status: DC | PRN
  Administered 2015-08-27: 16 mL

## 2015-08-27 MED ORDER — LACTATED RINGERS IV SOLN
INTRAVENOUS | Status: DC
  Administered 2015-08-27 (×2): via INTRAVENOUS
  Filled 2015-08-27: qty 1000

## 2015-08-27 MED ORDER — ATENOLOL 100 MG PO TABS
100.0000 mg | ORAL_TABLET | Freq: Every day | ORAL | Status: DC
  Administered 2015-08-28: 100 mg via ORAL
  Filled 2015-08-27: qty 1

## 2015-08-27 MED ORDER — PANTOPRAZOLE SODIUM 40 MG PO TBEC
40.0000 mg | DELAYED_RELEASE_TABLET | Freq: Every day | ORAL | Status: DC
  Administered 2015-08-28: 40 mg via ORAL
  Filled 2015-08-27: qty 1

## 2015-08-27 MED ORDER — BELLADONNA ALKALOIDS-OPIUM 16.2-60 MG RE SUPP
RECTAL | Status: DC | PRN
  Administered 2015-08-27: 1 via RECTAL

## 2015-08-27 SURGICAL SUPPLY — 66 items
BAG DRAIN URO-CYSTO SKYTR STRL (DRAIN) ×4 IMPLANT
BAG URINE DRAINAGE (UROLOGICAL SUPPLIES) ×4 IMPLANT
BAG URINE LEG 19OZ MD ST LTX (BAG) IMPLANT
BLADE CLIPPER SURG (BLADE) ×4 IMPLANT
BLADE SURG 15 STRL LF DISP TIS (BLADE) ×2 IMPLANT
BLADE SURG 15 STRL SS (BLADE) ×2
CANISTER SUCT LVC 12 LTR MEDI- (MISCELLANEOUS) IMPLANT
CATH FOLEY 2WAY SLVR  5CC 20FR (CATHETERS)
CATH FOLEY 2WAY SLVR  5CC 22FR (CATHETERS)
CATH FOLEY 2WAY SLVR 30CC 22FR (CATHETERS) IMPLANT
CATH FOLEY 2WAY SLVR 5CC 20FR (CATHETERS) IMPLANT
CATH FOLEY 2WAY SLVR 5CC 22FR (CATHETERS) IMPLANT
CATH FOLEY 3WAY 30CC 22F (CATHETERS) IMPLANT
CATH FOLEY 3WAY 30CC 22FR (CATHETERS) ×4 IMPLANT
CATH HEMA 3WAY 30CC 22FR COUDE (CATHETERS) IMPLANT
CATH HEMA 3WAY 30CC 24FR COUDE (CATHETERS) IMPLANT
CATH HEMA 3WAY 30CC 24FR RND (CATHETERS) IMPLANT
CLOTH BEACON ORANGE TIMEOUT ST (SAFETY) ×4 IMPLANT
COVER BACK TABLE 60X90IN (DRAPES) ×4 IMPLANT
COVER MAYO STAND STRL (DRAPES) ×4 IMPLANT
DISSECTOR ROUND CHERRY 3/8 STR (MISCELLANEOUS) IMPLANT
DRAPE LAPAROTOMY 100X72 PEDS (DRAPES) ×4 IMPLANT
DRSG KUZMA FLUFF (GAUZE/BANDAGES/DRESSINGS) ×4 IMPLANT
ELECT BIVAP BIPO 22/24 DONUT (ELECTROSURGICAL) ×4
ELECT NEEDLE TIP 2.8 STRL (NEEDLE) ×4 IMPLANT
ELECT REM PT RETURN 9FT ADLT (ELECTROSURGICAL) ×4
ELECTRD BIVAP BIPO 22/24 DONUT (ELECTROSURGICAL) ×2 IMPLANT
ELECTRODE REM PT RTRN 9FT ADLT (ELECTROSURGICAL) ×2 IMPLANT
EVACUATOR MICROVAS BLADDER (UROLOGICAL SUPPLIES) ×4 IMPLANT
GAUZE SPONGE 4X4 16PLY XRAY LF (GAUZE/BANDAGES/DRESSINGS) IMPLANT
GLOVE BIO SURGEON STRL SZ8 (GLOVE) ×8 IMPLANT
GOWN STRL REUS W/ TWL LRG LVL3 (GOWN DISPOSABLE) ×2 IMPLANT
GOWN STRL REUS W/ TWL XL LVL3 (GOWN DISPOSABLE) ×2 IMPLANT
GOWN STRL REUS W/TWL LRG LVL3 (GOWN DISPOSABLE) ×2
GOWN STRL REUS W/TWL XL LVL3 (GOWN DISPOSABLE) ×6 IMPLANT
HOLDER FOLEY CATH W/STRAP (MISCELLANEOUS) IMPLANT
IV NS IRRIG 3000ML ARTHROMATIC (IV SOLUTION) ×16 IMPLANT
KIT ROOM TURNOVER WOR (KITS) ×4 IMPLANT
MANIFOLD NEPTUNE II (INSTRUMENTS) ×4 IMPLANT
NEEDLE HYPO 25X1 1.5 SAFETY (NEEDLE) ×4 IMPLANT
NS IRRIG 500ML POUR BTL (IV SOLUTION) ×4 IMPLANT
PACK BASIN DAY SURGERY FS (CUSTOM PROCEDURE TRAY) ×4 IMPLANT
PACK CYSTO (CUSTOM PROCEDURE TRAY) ×4 IMPLANT
PENCIL BUTTON HOLSTER BLD 10FT (ELECTRODE) ×4 IMPLANT
PLUG CATH AND CAP STER (CATHETERS) IMPLANT
SET ASPIRATION TUBING (TUBING) ×4 IMPLANT
SUPPORT SCROTAL MED ADLT STRP (MISCELLANEOUS) ×3 IMPLANT
SUPPORTER ATHLETIC MED (MISCELLANEOUS) ×1
SUT CHROMIC 3 0 SH 27 (SUTURE) IMPLANT
SUT CHROMIC 4 0 SH 27 (SUTURE) IMPLANT
SUT MNCRL AB 4-0 PS2 18 (SUTURE) ×4 IMPLANT
SUT SILK 0 TIES 10X30 (SUTURE) ×4 IMPLANT
SUT VIC AB 3-0 SH 27 (SUTURE) ×2
SUT VIC AB 3-0 SH 27X BRD (SUTURE) ×2 IMPLANT
SUT VICRYL 2 0 18  UND BR (SUTURE)
SUT VICRYL 2 0 18 UND BR (SUTURE) IMPLANT
SYR 30ML LL (SYRINGE) IMPLANT
SYR BULB IRRIGATION 50ML (SYRINGE) IMPLANT
SYRINGE CONTROL L 12CC (SYRINGE) ×4 IMPLANT
SYRINGE IRR TOOMEY STRL 70CC (SYRINGE) IMPLANT
TOWEL OR 17X24 6PK STRL BLUE (TOWEL DISPOSABLE) ×8 IMPLANT
TRAY DSU PREP LF (CUSTOM PROCEDURE TRAY) ×4 IMPLANT
TUBE CONNECTING 12'X1/4 (SUCTIONS) ×1
TUBE CONNECTING 12X1/4 (SUCTIONS) ×3 IMPLANT
WATER STERILE IRR 500ML POUR (IV SOLUTION) IMPLANT
YANKAUER SUCT BULB TIP NO VENT (SUCTIONS) ×4 IMPLANT

## 2015-08-27 NOTE — Anesthesia Preprocedure Evaluation (Addendum)
Anesthesia Evaluation  Patient identified by MRN, date of birth, ID band Patient awake    Reviewed: Allergy & Precautions, NPO status , Patient's Chart, lab work & pertinent test results, reviewed documented beta blocker date and time   History of Anesthesia Complications Negative for: history of anesthetic complications  Airway Mallampati: II  TM Distance: >3 FB Neck ROM: Full    Dental  (+) Dental Advisory Given, Lower Dentures, Upper Dentures   Pulmonary former smoker,    Pulmonary exam normal breath sounds clear to auscultation       Cardiovascular hypertension, Pt. on medications and Pt. on home beta blockers (-) angina(-) CAD and (-) Past MI Normal cardiovascular exam Rhythm:Regular Rate:Normal     Neuro/Psych negative neurological ROS  negative psych ROS   GI/Hepatic Neg liver ROS, GERD  Medicated,  Endo/Other  negative endocrine ROS  Renal/GU negative Renal ROS   Prostate cancer     Musculoskeletal negative musculoskeletal ROS (+)   Abdominal   Peds  Hematology negative hematology ROS (+)   Anesthesia Other Findings Day of surgery medications reviewed with the patient.  Reproductive/Obstetrics                            Anesthesia Physical Anesthesia Plan  ASA: III  Anesthesia Plan: General   Post-op Pain Management:    Induction: Intravenous  Airway Management Planned: LMA  Additional Equipment:   Intra-op Plan:   Post-operative Plan: Extubation in OR  Informed Consent: I have reviewed the patients History and Physical, chart, labs and discussed the procedure including the risks, benefits and alternatives for the proposed anesthesia with the patient or authorized representative who has indicated his/her understanding and acceptance.   Dental advisory given  Plan Discussed with: CRNA  Anesthesia Plan Comments: (Risks/benefits of general anesthesia discussed with  patient including risk of damage to teeth, lips, gum, and tongue, nausea/vomiting, allergic reactions to medications, and the possibility of heart attack, stroke and death.  All patient questions answered.  Patient wishes to proceed.)        Anesthesia Quick Evaluation

## 2015-08-27 NOTE — Op Note (Signed)
Preoperative diagnosis: 1. 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 Metastatic adenocarcinoma of the prostate  Postoperative diagnosis:  Same  Procedure:  1. Cystoscopy 2. Transurethral vaporization of the prostate 3. Bilateral simple orchiectomy  Surgeon: Lillette Boxer. Aliou Mealey, M.D.  Anesthesia: General  Complications: None  Drain: Foley catheter, 22 [place] three-way with CBI using normal saline  EBL: Minimal  Specimens: 1. Bilateral testicles  Disposition of specimens: Pathology  Indication: Gregory [name] is a patient with bladder outlet obstruction secondary to adenocarcinoma prostate with metastases to the axial and appendicular skeleton. The patient is not a candidate for curative therapy of the prostate, and we have discussed, in the office, androgen deprivation therapy as primary treatment strategy for his metastatic adenocarcinoma the prostate. Additionally, the patient is an retention because of bladder outlet obstruction secondary to prostate cancer.. After reviewing the management options for treatment, he elected to proceed with the above surgical procedure(s). We have discussed the potential benefits and risks of the procedures, side effects of the proposed treatment, the likelihood of the patient achieving the goals of the procedure, and any potential problems that might occur during the procedure or recuperation. Informed consent has been obtained.  Description of procedure:  The patient was identified in the holding area.He received preoperative antibiotics. He was then taken to the operating room. General anesthetic was administered.  The patient was then placed in the dorsal lithotomy position, prepped and draped in the usual sterile fashion. Timeout was then performed.  A resectoscope sheath was placed using the obturator, and the resectoscope, loop and telescope were placed.  The bladder was then systematically examined in its entirety.  There was no evidence of  tumors, stones, or other mucosal pathology.  The ureteral orifices were identified and marked so as to be avoided during the procedure.  The prostate adenoma was then vaporized utilizing the bipolar button.  The prostate adenoma from the bladder neck back to the verumontanum was vaporized beginning at the six o'clock position and then extended to include the right and left lobes of the prostate and anterior prostate. Care was taken not to vaporize distal to the verumontanum.  Hemostasis was then achieved with the cautery and the bladder was emptied and reinspected with no significant bleeding noted at the end of the procedure.  Fragments from the vaporization were irrigated from the bladder with the evacuator.  A 3 way catheter was then placed into the bladder and placed on continuous bladder irrigation.    At this point, the patient was placed in the supine position. A second timeout was performed. His genitalia and perineum were prepped and draped, and the Foley catheter was put on traction off of his abdomen. A 3 cm incision was made in the lower aspect of his anterior scrotum, along the midline raphae. Is carried down to the right tunica albuginea with a letter cautery. Dissection was carried up along the cord on the right side, skeletonizing the structure. I then placed a single Kelly clamp on the right spermatic cord. The  cord was transected just below the clamp. 8 mL of quarter percent plain Marcaine was used to provide a spermatic cord block on the right. 2 separate sutures of #1 silk were then used to ligate the right spermatic cord. Following removal of the clamp, hemostasis was excellent. The same procedure was done to the left testicle, as dictated above. Again, 8 mL of quarter percent plain Marcaine was used for a block, and 2 #1 silk ligatures  were applied. Again, hemostasis was excellent. Both testicles and cords were sent together for gross pathology review.  Following insurance of adequate hemostasis, dartos fascia was reapproximated with a 3-0 Vicryl suture placed in an simple running fashion. Skin edges were reapproximated using 4-0 Monocryl, again in a simple running fashion. Dry sterile dressings/fluffs were placed, and a n athletic supporter was in place.  At this point, the procedures were terminated. The patient was awakened and taken to the PACU in stable condition. He tolerated procedure well.

## 2015-08-27 NOTE — Transfer of Care (Signed)
Immediate Anesthesia Transfer of Care Note  Patient: Gregory [name]  Procedure(s) Performed: Procedure(s): TRANSURETHRAL RESECTION OF THE PROSTATE WITH GYRUS INSTRUMENTS (N/A) ORCHIECTOMY (Bilateral)  Patient Location: PACU  Anesthesia Type:General  Level of Consciousness: awake, alert  and oriented  Airway & Oxygen Therapy: Patient Spontanous Breathing and Patient connected to face mask oxygen  Post-op Assessment: Report given to RN  Post vital signs: Reviewed and stable  Last Vitals:  Filed Vitals:   08/27/15 1158 08/27/15 1445  BP: 147/77 111/64  Pulse: 74 63  Temp: 36.5 C 36.3 C  Resp: 16 10    Complications: No apparent anesthesia complications

## 2015-08-27 NOTE — Anesthesia Procedure Notes (Signed)
Procedure Name: LMA Insertion Date/Time: 08/27/2015 1:15 PM Performed by: Bethena Roys T Pre-anesthesia Checklist: Patient identified, Emergency Drugs available, Suction available and Patient being monitored Patient Re-evaluated:Patient Re-evaluated prior to inductionOxygen Delivery Method: Circle System Utilized Preoxygenation: Pre-oxygenation with 100% oxygen Intubation Type: IV induction Ventilation: Mask ventilation without difficulty LMA: LMA inserted LMA Size: 5.0 Number of attempts: 1 Airway Equipment and Method: Bite block Placement Confirmation: positive ETCO2 Tube secured with: Tape Dental Injury: Teeth and Oropharynx as per pre-operative assessment

## 2015-08-27 NOTE — H&P (Signed)
Urology History and Physical Exam  CC: Metastatic prostate cancer, bladder outlet obstruction  HPI: 82 year old male presents at this time for TURP as well as bilateral orchiectomy. The patient was recently diagnosed with high-grade, metastatic adenocarcinoma the prostate, despite a PSA of only 18.7. He underwent ultrasound and biopsy as well as metastatic survey. The patient was found to have multiple bony metastases, and with his urinary retention and being catheter dependent, he presents at this time for TURP as well as surgical castration for management of his bladder outlet obstruction and metastatic prostate cancer.   PMH: Past Medical History  Diagnosis Date  . Hypertension   . Foley catheter in place     Urinary Retension  . BPH (benign prostatic hyperplasia)   . Prostate cancer [facility])   urologist- dr Delshon Blanchfield/  oncologist-  dr Ander Slade    Gleason 4+5,  PSA 18.74,  w/  Visceral and Extensive Bones METS--  pallitive radiation therapy and hormone therapy  . Bone metastases ([place])   . GERD (gastroesophageal reflux disease)     PSH: Past Surgical History  Procedure Laterality Date  . Prostate biopsy  1/16 /2017  . Multiple tooth extractions  1980's    Allergies: No Known Allergies  Medications: No prescriptions prior to admission     Social History: Social History   Social History  . Marital Status: Divorced    Spouse Name: N/A  . Number of Children: N/A  . Years of Education: N/A   Occupational History  . Retired    Social History Main Topics  . Smoking status: Former Smoker -- 0.25 packs/day for 30 years    Types: Cigarettes    Start date: 07/11/1992  . Smokeless tobacco: Never Used  . Alcohol Use: No  . Drug Use: No  . Sexual Activity: Not on file   Other Topics Concern  . Not on file   Social History Narrative    Family History: Family History  Problem Relation Age of Onset  . Tuberculosis Mother 62  . Other Father 86    Review of  Systems: Genitourinary, constitutional, skin, eye, otolaryngeal, hematologic/lymphatic, cardiovascular, pulmonary, endocrine, musculoskeletal, gastrointestinal, neurological and psychiatric system(s) were reviewed and pertinent findings if present are noted and are otherwise negative.  Genitourinary: nocturia and urinary stream starts and stops.  Constitutional: feeling tired (fatigue).  Cardiovascular: leg swelling.  Respiratory: shortness of breath.   Physical Exam: Constitutional: Well nourished and well developed . No acute distress. Patient appears younger than his stated age.  ENT:. The ears and nose are normal in appearance.  Neck: The appearance of the neck is normal.  Pulmonary: No respiratory distress and normal respiratory rhythm and effort.  Abdomen: No hernias are palpable.  Rectal: Rectal exam demonstrates normal sphincter tone and the anus is normal on inspection. Prostate size is estimated to be 70 g. Anal canal was long, so I could not palpate the entire prostate. The prostate has a palpable nodule (Several nodules are present bilaterally within the prostate which feels enlarged) and is not tender.  Genitourinary: Examination of the penis demonstrates no discharge, no masses, no lesions and a normal meatus. The penis is uncircumcised. The scrotum is normal in appearance and without lesions. The right epididymis is palpably normal and non-tender. The left epididymis is palpably normal and non-tender. The right testis is atrophic, but palpably normal, non-tender and without masses. The left testis is atrophic, but normal, non-tender and without masses.  Lymphatics: The femoral and inguinal nodes  are not enlarged or tender.  Skin: Normal skin turgor, no visible rash and no visible skin lesions.  Neuro/Psych:. Mood and affect are appropriate.  Studies:  No results for input(s): HGB, WBC, PLT in the last 72 hours.  No results for input(s): NA, K, CL, CO2, BUN, CREATININE, CALCIUM,  GFRNONAA, GFRAA in the last 72 hours.  Invalid input(s): MAGNESIUM   No results for input(s): INR, APTT in the last 72 hours.  Invalid input(s): PT   Invalid input(s): ABG    Assessment:  Metastatic adenocarcinoma the prostate, bladder outlet obstruction  Plan: TURP, bilateral castration

## 2015-08-28 ENCOUNTER — Ambulatory Visit: Admission: RE | Admit: 2015-08-28 | Payer: Medicare Other | Source: Ambulatory Visit | Admitting: Radiation Oncology

## 2015-08-28 ENCOUNTER — Ambulatory Visit
Admission: RE | Admit: 2015-08-28 | Discharge: 2015-08-28 | Disposition: A | Discharge status: Home / Self Care | Payer: Medicare Other | Source: Ambulatory Visit | Attending: Radiation Oncology | Admitting: Radiation Oncology

## 2015-08-28 ENCOUNTER — Encounter (HOSPITAL_BASED_OUTPATIENT_CLINIC_OR_DEPARTMENT_OTHER): Payer: Self-pay | Admitting: Urology

## 2015-08-28 ENCOUNTER — Inpatient Hospital Stay (HOSPITAL_COMMUNITY)
Admission: EM | Admit: 2015-08-28 | Discharge: 2015-09-05 | DRG: 711 | Disposition: A | Discharge status: Home / Self Care | Payer: Medicare Other | Attending: Internal Medicine | Admitting: Internal Medicine

## 2015-08-28 DIAGNOSIS — Z789 Other specified health status: Secondary | ICD-10-CM | POA: Insufficient documentation

## 2015-08-28 DIAGNOSIS — R739 Hyperglycemia, unspecified: Secondary | ICD-10-CM | POA: Diagnosis present

## 2015-08-28 DIAGNOSIS — R339 Retention of urine, unspecified: Secondary | ICD-10-CM

## 2015-08-28 DIAGNOSIS — C7951 Secondary malignant neoplasm of bone: Secondary | ICD-10-CM

## 2015-08-28 DIAGNOSIS — Z452 Encounter for adjustment and management of vascular access device: Secondary | ICD-10-CM

## 2015-08-28 DIAGNOSIS — R0902 Hypoxemia: Secondary | ICD-10-CM

## 2015-08-28 DIAGNOSIS — E875 Hyperkalemia: Secondary | ICD-10-CM | POA: Diagnosis present

## 2015-08-28 DIAGNOSIS — I5033 Acute on chronic diastolic (congestive) heart failure: Secondary | ICD-10-CM | POA: Diagnosis present

## 2015-08-28 DIAGNOSIS — C61 Malignant neoplasm of prostate: Secondary | ICD-10-CM

## 2015-08-28 DIAGNOSIS — N179 Acute kidney failure, unspecified: Secondary | ICD-10-CM | POA: Diagnosis present

## 2015-08-28 DIAGNOSIS — Z9079 Acquired absence of other genital organ(s): Secondary | ICD-10-CM

## 2015-08-28 DIAGNOSIS — R0602 Shortness of breath: Secondary | ICD-10-CM

## 2015-08-28 DIAGNOSIS — I10 Essential (primary) hypertension: Secondary | ICD-10-CM | POA: Diagnosis present

## 2015-08-28 DIAGNOSIS — R7989 Other specified abnormal findings of blood chemistry: Secondary | ICD-10-CM

## 2015-08-28 DIAGNOSIS — R778 Other specified abnormalities of plasma proteins: Secondary | ICD-10-CM | POA: Diagnosis present

## 2015-08-28 DIAGNOSIS — R319 Hematuria, unspecified: Secondary | ICD-10-CM

## 2015-08-28 LAB — URINALYSIS, ROUTINE W REFLEX MICROSCOPIC
BILIRUBIN URINE: NEGATIVE
GLUCOSE, UA: NEGATIVE mg/dL
KETONES UR: NEGATIVE mg/dL
LEUKOCYTES UA: NEGATIVE
Nitrite: NEGATIVE
PH: 6.5 (ref 5.0–8.0)
Protein, ur: 100 mg/dL — AB
Specific Gravity, Urine: 1.006 (ref 1.005–1.030)

## 2015-08-28 LAB — BASIC METABOLIC PANEL
Anion gap: 8 (ref 5–15)
BUN: 36 mg/dL — AB (ref 6–20)
CALCIUM: 8.6 mg/dL — AB (ref 8.9–10.3)
CO2: 22 mmol/L (ref 22–32)
CREATININE: 3.05 mg/dL — AB (ref 0.61–1.24)
Chloride: 98 mmol/L — ABNORMAL LOW (ref 101–111)
GFR calc non Af Amer: 17 mL/min — ABNORMAL LOW (ref >60)
GFR, EST AFRICAN AMERICAN: 20 mL/min — AB (ref >60)
GLUCOSE: 188 mg/dL — AB (ref 65–99)
Potassium: 4.2 mmol/L (ref 3.5–5.1)
Sodium: 128 mmol/L — ABNORMAL LOW (ref 135–145)

## 2015-08-28 LAB — CBC WITH DIFFERENTIAL/PLATELET
BASOS PCT: 0 %
Basophils Absolute: 0 10*3/uL (ref 0.0–0.1)
Eosinophils Absolute: 0.1 10*3/uL (ref 0.0–0.7)
Eosinophils Relative: 0 %
HEMATOCRIT: 33.1 % — AB (ref 39.0–52.0)
Hemoglobin: 11.3 g/dL — ABNORMAL LOW (ref 13.0–17.0)
Lymphocytes Relative: 17 %
Lymphs Abs: 2.6 10*3/uL (ref 0.7–4.0)
MCH: 30.1 pg (ref 26.0–34.0)
MCHC: 34.1 g/dL (ref 30.0–36.0)
MCV: 88.3 fL (ref 78.0–100.0)
MONO ABS: 1.3 10*3/uL — AB (ref 0.1–1.0)
MONOS PCT: 8 %
NEUTROS ABS: 11.6 10*3/uL — AB (ref 1.7–7.7)
Neutrophils Relative %: 75 %
Platelets: 166 10*3/uL (ref 150–400)
RBC: 3.75 MIL/uL — ABNORMAL LOW (ref 4.22–5.81)
RDW: 14.5 % (ref 11.5–15.5)
WBC: 15.5 10*3/uL — ABNORMAL HIGH (ref 4.0–10.5)

## 2015-08-28 LAB — URINE MICROSCOPIC-ADD ON: Bacteria, UA: NONE SEEN

## 2015-08-28 MED ORDER — CEPHALEXIN 500 MG PO CAPS: 500.0000 mg | ORAL_CAPSULE | Freq: Two times a day (BID) | ORAL | Status: DC

## 2015-08-28 MED ORDER — LIDOCAINE HCL 2 % EX GEL
1.0000 "application " | Freq: Once | CUTANEOUS | Status: DC
  Filled 2015-08-28: qty 11

## 2015-08-28 MED ORDER — MORPHINE SULFATE (PF) 2 MG/ML IV SOLN
2.0000 mg | INTRAVENOUS | Status: AC | PRN
  Administered 2015-08-28 (×2): 2 mg via INTRAVENOUS
  Filled 2015-08-28 (×2): qty 1

## 2015-08-28 NOTE — ED Notes (Signed)
Pt changed out of bloody pants and underwear. Pt states that he feels the need to urinate. Pt given urinal, but feels that he cannot urinate. Small drops of blood noted in urinal. No urine.

## 2015-08-28 NOTE — ED Notes (Signed)
Bed: WA03 Expected date:  Expected time:  Means of arrival:  Comments: EMS 82yo M dc'd today / bleeding / prostate surgery

## 2015-08-28 NOTE — ED Notes (Addendum)
Pt d/c from [facility] today post prostate surgery yesterday. Pt complaining of painful urination and pain around surgical site. Last urination was today on the 4th floor here before d/c. EMS visualized blood in pts pants.  153/96 with EMS

## 2015-08-28 NOTE — H&P (Signed)
Gregory [name] is an 82 y.o. male.   Chief Complaint: Gross hematuria and retention HPI: 82 year old male patient of Dr. Diona Fanti who was recently diagnosed with a poorly differentiated adenocarcinoma the prostate. PSA was around 20. Biopsy showed Gleason's 9 disease and he had evidence of metastatic disease on imaging. The patient was taken yesterday to the operating room for cystoscopy as well as transurethral vaporization of the prostate and a bilateral simple orchiectomy for treatment of his metastatic prostate cancer. His Foley catheter was removed this morning and it's unclear how successful he was with voiding. He got discharged around noon today. He presented to the emergency room this evening with inability to void. He was noted to have significant gross hematuria and 18 French three-way Foley catheter was inserted. Several bags of irrigant were utilized. Urine continues quite bloody and therefore we were consulted. He currently denies any abdominal pain. He really has no significant complaints at this time.  Past Medical History  Diagnosis Date  . Hypertension   . Foley catheter in place     Urinary Retension  . BPH (benign prostatic hyperplasia)   . Prostate cancer [facility])   urologist- dr dahlstedt/  oncologist-  dr Ander Slade    Gleason 4+5,  PSA 18.74,  w/  Visceral and Extensive Bones METS--  pallitive radiation therapy and hormone therapy  . Bone metastases ([place])   . GERD (gastroesophageal reflux disease)     Past Surgical History  Procedure Laterality Date  . Prostate biopsy  1/16 /2017  . Multiple tooth extractions  1980's  . Transurethral resection of prostate N/A 08/27/2015    Procedure: TRANSURETHRAL RESECTION OF THE PROSTATE WITH GYRUS INSTRUMENTS;  Surgeon: Franchot Gallo, MD;  Location: [facility];  Service: Urology;  Laterality: N/A;  . Orchiectomy Bilateral 08/27/2015    Procedure: ORCHIECTOMY;  Surgeon: Franchot Gallo, MD;  Location: [facility];  Service: Urology;  Laterality: Bilateral;    Family History  Problem Relation Age of Onset  . Tuberculosis Mother 52  . Other Father 59   Social History:  reports that he has quit smoking. His smoking use included Cigarettes. He started smoking about 23 years ago. He has a 7.5 pack-year smoking history. He has never used smokeless tobacco. He reports that he does not drink alcohol or use illicit drugs.  Allergies: No Known Allergies   (Not in a hospital admission)  Results for orders placed or performed during the hospital encounter of 08/28/15 (from the past 48 hour(s))  CBC with Differential     Status: Abnormal   Collection Time: 08/28/15  8:23 PM  Result Value Ref Range   WBC 15.5 (H) 4.0 - 10.5 K/uL   RBC 3.75 (L) 4.22 - 5.81 MIL/uL   Hemoglobin 11.3 (L) 13.0 - 17.0 g/dL    Comment: REPEATED TO VERIFY DELTA CHECK NOTED EC4 I STAT    HCT 33.1 (L) 39.0 - 52.0 %   MCV 88.3 78.0 - 100.0 fL   MCH 30.1 26.0 - 34.0 pg   MCHC 34.1 30.0 - 36.0 g/dL   RDW 14.5 11.5 - 15.5 %   Platelets 166 150 - 400 K/uL   Neutrophils Relative % 75 %   Neutro Abs 11.6 (H) 1.7 - 7.7 K/uL   Lymphocytes Relative 17 %   Lymphs Abs 2.6 0.7 - 4.0 K/uL   Monocytes Relative 8 %   Monocytes Absolute 1.3 (H) 0.1 - 1.0 K/uL   Eosinophils Relative 0 %  Eosinophils Absolute 0.1 0.0 - 0.7 K/uL   Basophils Relative 0 %   Basophils Absolute 0.0 0.0 - 0.1 K/uL  Basic metabolic panel     Status: Abnormal   Collection Time: 08/28/15 10:46 PM  Result Value Ref Range   Sodium 128 (L) 135 - 145 mmol/L    Comment: DELTA CHECK NOTED REPEATED TO VERIFY MODERATE HEMOLYSIS    Potassium 4.2 3.5 - 5.1 mmol/L   Chloride 98 (L) 101 - 111 mmol/L   CO2 22 22 - 32 mmol/L   Glucose, Bld 188 (H) 65 - 99 mg/dL   BUN 36 (H) 6 - 20 mg/dL   Creatinine, Ser 3.05 (H) 0.61 - 1.24 mg/dL   Calcium 8.6 (L) 8.9 - 10.3 mg/dL   GFR calc non Af Amer 17 (L) >60 mL/min   GFR calc Af Amer 20 (L) >60 mL/min     Comment: (NOTE) The eGFR has been calculated using the CKD EPI equation. This calculation has not been validated in all clinical situations. eGFR's persistently <60 mL/min signify possible Chronic Kidney Disease.    Anion gap 8 5 - 15  Urinalysis, Routine w reflex microscopic (not at [facility])     Status: Abnormal   Collection Time: 08/28/15 11:02 PM  Result Value Ref Range   Color, Urine RED (A) YELLOW    Comment: BIOCHEMICALS MAY BE AFFECTED BY COLOR   APPearance TURBID (A) CLEAR   Specific Gravity, Urine 1.006 1.005 - 1.030   pH 6.5 5.0 - 8.0   Glucose, UA NEGATIVE NEGATIVE mg/dL   Hgb urine dipstick LARGE (A) NEGATIVE   Bilirubin Urine NEGATIVE NEGATIVE   Ketones, ur NEGATIVE NEGATIVE mg/dL   Protein, ur 100 (A) NEGATIVE mg/dL   Nitrite NEGATIVE NEGATIVE   Leukocytes, UA NEGATIVE NEGATIVE  Urine microscopic-add on     Status: Abnormal   Collection Time: 08/28/15 11:02 PM  Result Value Ref Range   Squamous Epithelial / LPF 0-5 (A) NONE SEEN   WBC, UA 0-5 0 - 5 WBC/hpf   RBC / HPF TOO NUMEROUS TO COUNT 0 - 5 RBC/hpf   Bacteria, UA NONE SEEN NONE SEEN   Urine-Other URINALYSIS PERFORMED ON SUPERNATANT    No results found.  Review of Systems - Negative except previous abdominal pressure and discomfort now improved. Hematuria  Blood pressure 115/64, pulse 76, temperature 97.5 F (36.4 C), temperature source Oral, resp. rate 20, SpO2 93 %. General appearance: alert, cooperative and no distress Neck: no adenopathy and no JVD Resp: clear to auscultation bilaterally Cardio: regular rate and rhythm GI: soft, non-tender; bowel sounds normal; no masses,  no organomegaly Male genitalia: normal, penis: no lesions or discharge. testes: Absent. no hernias Three-way Foley catheter with significant ongoing hematuria. Extremities: extremities normal, atraumatic, no cyanosis or edema Skin: Skin color, texture, turgor normal. No rashes or lesions Neurologic: Grossly  normal  Assessment/Plan Poorly differentiated metastatic adenocarcinoma the prostate. The patient is status post vaporization of the prostate due to urinary obstruction. Apparently at the time of discharge his urine was clear. He subsequently has been unable to void and presented today to the emergency room with significant hematuria. He has had a significant drop in hemoglobin. The patient was hand irrigated with approximately 3 L of normal saline. Many old clots were obtained. There is ongoing bleeding that is medium cherry with no further production of clots at this time. He is on no anticoagulation. He will be admitted and continued on continuous bladder irrigation with normal saline  and hand irrigation as needed. Repeat blood work in the morning. If he develops significant recurrent clot obstruction on irrigation he may require a trip to the OR to look for a bleeder and perform cauterization and clot evacuation.  Syriah Delisi S 08/28/2015, 11:54 PM

## 2015-08-28 NOTE — ED Notes (Signed)
MD at bedside. 

## 2015-08-28 NOTE — Care Management Note (Signed)
Case Management Note  Patient Details  Name: Gregory [name] MRN: [id number] Date of Birth: 12-18-1928  Subjective/Objective:                    Action/Plan:d/c home no needs or orders.   Expected Discharge Date:                  Expected Discharge Plan:  Home/Self Care  In-House Referral:     Discharge planning Services  CM Consult  Post Acute Care Choice:    Choice offered to:     DME Arranged:    DME Agency:     HH Arranged:    [place] Agency:     Status of Service:  Completed, signed off  Medicare Important Message Given:    Date Medicare IM Given:    Medicare IM give by:    Date Additional Medicare IM Given:    Additional Medicare Important Message give by:     If discussed at [place] of Stay Meetings, dates discussed:    Additional Comments:  Dessa Phi, RN 08/28/2015, 11:55 AM

## 2015-08-28 NOTE — ED Notes (Signed)
Urologist at bedside.

## 2015-08-28 NOTE — Anesthesia Postprocedure Evaluation (Signed)
Anesthesia Post Note  Patient: Gregory [name]  Procedure(s) Performed: Procedure(s) (LRB): TRANSURETHRAL RESECTION OF THE PROSTATE WITH GYRUS INSTRUMENTS (N/A) ORCHIECTOMY (Bilateral)  Patient location during evaluation: PACU Anesthesia Type: General Level of consciousness: awake and alert Pain management: pain level controlled Vital Signs Assessment: post-procedure vital signs reviewed and stable Respiratory status: spontaneous breathing, nonlabored ventilation, respiratory function stable and patient connected to nasal cannula oxygen Cardiovascular status: blood pressure returned to baseline and stable Postop Assessment: no signs of nausea or vomiting Anesthetic complications: no    Last Vitals:  Filed Vitals:   08/27/15 2027 08/28/15 0509  BP: 115/56 122/59  Pulse: 74 65  Temp: 36.7 C 36.9 C  Resp: 18 18    Last Pain:  Filed Vitals:   08/28/15 0510  PainSc: 0-No pain                 Catalina Gravel

## 2015-08-28 NOTE — Discharge Instructions (Signed)
Transurethral Resection of the Prostate ° °Care After ° °Refer to this sheet in the next few weeks. These discharge instructions provide you with general information on caring for yourself after you leave the hospital. Your caregiver may also give you specific instructions. Your treatment has been planned according to the most current medical practices available, but unavoidable complications sometimes occur. If you have any problems or questions after discharge, please call your caregiver. ° °HOME CARE INSTRUCTIONS  ° °Medications °· You may receive medicine for pain management. As your level of discomfort decreases, adjustments in your pain medicines may be made.  °· Take all medicines as directed.  °· You may be given a medicine (antibiotic) to kill germs following surgery. Finish all medicines. Let your caregiver know if you have any side effects or problems from the medicine.  °· If you are on aspirin, it would be best not to restart the aspirin until the blood in the urine clears °Hygiene °· You can take a shower after surgery.  °· You should not take a bath while you still have the urethral catheter. °Activity °· You will be encouraged to get out of bed as much as possible and increase your activity level as tolerated.  °· Spend the first week in and around your home. For 3 weeks, avoid the following:  °· Straining.  °· Running.  °· Strenuous work.  °· Walks longer than a few blocks.  °· Riding for extended periods.  °· Sexual relations.  °· Do not lift heavy objects (more than 20 pounds) for at least 1 month. When lifting, use your arms instead of your abdominal muscles.  °· You will be encouraged to walk as tolerated. Do not exert yourself. Increase your activity level slowly. Remember that it is important to keep moving after an operation of any type. This cuts down on the possibility of developing blood clots.  °· Your caregiver will tell you when you can resume driving and light housework. Discuss this  at your first office visit after discharge. °Diet °· No special diet is ordered after a TURP. However, if you are on a special diet for another medical problem, it should be continued.  °· Normal fluid intake is usually recommended.  °· Avoid alcohol and caffeinated drinks for 2 weeks. They irritate the bladder. Decaffeinated drinks are okay.  °· Avoid spicy foods.  °Bladder Function °· For the first 10 days, empty the bladder whenever you feel a definite desire. Do not try to hold the urine for long periods of time.  °· Urinating once or twice a night even after you are healed is not uncommon.  °· You may see some recurrence of blood in the urine after discharge from the hospital. This usually happens within 2 weeks after the procedure.If this occurs, force fluids again as you did in the hospital and reduce your activity.  °Bowel Function °· You may experience some constipation after surgery. This can be minimized by increasing fluids and fiber in your diet. Drink enough water and fluids to keep your urine clear or pale yellow.  °· A stool softener may be prescribed for use at home. Do not strain to move your bowels.  °· If you are requiring increased pain medicine, it is important that you take stool softeners to prevent constipation. This will help to promote proper healing by reducing the need to strain to move your bowels.  °Sexual Activity °· Semen movement in the opposite direction and into the bladder (  retrograde ejaculation) may occur. Since the semen passes into the bladder, cloudy urine can occur the first time you urinate after intercourse. Or, you may not have an ejaculation during erection. Ask your caregiver when you can resume sexual activity. Retrograde ejaculation and reduced semen discharge should not reduce one's pleasure of intercourse.  °Postoperative Visit °· Arrange the date and time of your after surgery visit with your caregiver.  °Return to Work °· After your recovery is complete, you will  be able to return to work and resume all activities. Your caregiver will inform you when you can return to work.  °Foley Catheter Care °A soft, flexible tube (Foley catheter) may have been placed in your bladder to drain urine and fluid. Follow these instructions: °Taking Care of the Catheter °· Keep the area where the catheter leaves your body clean.  °· Attach the catheter to the leg so there is no tension on the catheter.  °· Keep the drainage bag below the level of the bladder, but keep it OFF the floor.  °· Do not take long soaking baths. Your caregiver will give instructions about showering.  °· Wash your hands before touching ANYTHING related to the catheter or bag.  °· Using mild soap and warm water on a washcloth:  °· Clean the area closest to the catheter insertion site using a circular motion around the catheter.  °· Clean the catheter itself by wiping AWAY from the insertion site for several inches down the tube.  °· NEVER wipe upward as this could sweep bacteria up into the urethra (tube in your body that normally drains the bladder) and cause infection.  °· Place a small amount of sterile lubricant at the tip of the penis where the catheter is entering.  °Taking Care of the Drainage Bags °· Two drainage bags may be taken home: a large overnight drainage bag, and a smaller leg bag which fits underneath clothing.  °· It is okay to wear the overnight bag at any time, but NEVER wear the smaller leg bag at night.  °· Keep the drainage bag well below the level of your bladder. This prevents backflow of urine into the bladder and allows the urine to drain freely.  °· Anchor the tubing to your leg to prevent pulling or tension on the catheter. Use tape or a leg strap provided by the hospital.  °· Empty the drainage bag when it is 1/2 to 3/4 full. Wash your hands before and after touching the bag.  °· Periodically check the tubing for kinks to make sure there is no pressure on the tubing which could restrict  the flow of urine.  °Changing the Drainage Bags °· Cleanse both ends of the clean bag with alcohol before changing.  °· Pinch off the rubber catheter to avoid urine spillage during the disconnection.  °· Disconnect the dirty bag and connect the clean one.  °· Empty the dirty bag carefully to avoid a urine spill.  °· Attach the new bag to the leg with tape or a leg strap.  °Cleaning the Drainage Bags °· Whenever a drainage bag is disconnected, it must be cleaned quickly so it is ready for the next use.  °· Wash the bag in warm, soapy water.  °· Rinse the bag thoroughly with warm water.  °· Soak the bag for 30 minutes in a solution of white vinegar and water (1 cup vinegar to 1 quart warm water).  °· Rinse with warm water.  °SEEK MEDICAL   CARE IF:  °· You have chills or night sweats.  °· You are leaking around your catheter or have problems with your catheter. It is not uncommon to have sporadic leakage around your catheter as a result of bladder spasms. If the leakage stops, there is not much need for concern. If you are uncertain, call your caregiver.  °· You develop side effects that you think are coming from your medicines.  °SEEK IMMEDIATE MEDICAL CARE IF:  °· You are suddenly unable to urinate. Check to see if there are any kinks in the drainage tubing that may cause this. If you cannot find any kinks, call your caregiver immediately. This is an emergency.  °· You develop shortness of breath or chest pains.  °· Bleeding persists or clots develop in your urine.  °· You have a fever.  °· You develop pain in your back or over your lower belly (abdomen).  °· You develop pain or swelling in your legs.  °· Any problems you are having get worse rather than better.  °MAKE SURE YOU:  °· Understand these instructions.  °· Will watch your condition.  °· Will get help right away if you are not doing well or get worse.  °Document Released: 06/27/2005 Document Revised: 03/09/2011 Document Reviewed: 02/18/2009 °[employer]®  Patient Information ©2012 [employer].Transurethral Resection of the Prostate °Care After °Refer to this sheet in the next few weeks. These discharge instructions provide you with general information on caring for yourself after you leave the hospital. Your caregiver may also give you specific instructions. Your treatment has been planned according to the most current medical practices available, but unavoidable complications sometimes occur. If you have any problems or questions after discharge, please call your caregiver. °

## 2015-08-28 NOTE — ED Provider Notes (Signed)
CSN: [id number]     Arrival date & time 08/28/15  1928 History   First MD Initiated Contact with Patient 08/28/15 1933     Chief Complaint  Patient presents with  . Post-op Problem     (Consider location/radiation/quality/duration/timing/severity/associated sxs/prior Treatment) HPI 82 year old male who presents with hematuria and urinary retention. History of bladder outlet obstruction secondary to and no carcinoma of the prostate. Underwent TURP by Dr. Diona Fanti on 08/27/2015. Was discharged today at noon. States that after discontinuation of his Foley catheter at 5 AM this morning he has been unable to urinate. States increasing lower abdominal pain and swelling. Returns to the ED for reevaluation and on arrival was noted to have blood clots from his urethra. Denies any fevers, nausea or vomiting, diarrhea.    Past Medical History  Diagnosis Date  . Hypertension   . Foley catheter in place     Urinary Retension  . BPH (benign prostatic hyperplasia)   . Prostate cancer [facility])   urologist- dr dahlstedt/  oncologist-  dr Ander Slade    Gleason 4+5,  PSA 18.74,  w/  Visceral and Extensive Bones METS--  pallitive radiation therapy and hormone therapy  . Bone metastases ([place])   . GERD (gastroesophageal reflux disease)    Past Surgical History  Procedure Laterality Date  . Prostate biopsy  1/16 /2017  . Multiple tooth extractions  1980's  . Transurethral resection of prostate N/A 08/27/2015    Procedure: TRANSURETHRAL RESECTION OF THE PROSTATE WITH GYRUS INSTRUMENTS;  Surgeon: Franchot Gallo, MD;  Location: [facility];  Service: Urology;  Laterality: N/A;  . Orchiectomy Bilateral 08/27/2015    Procedure: ORCHIECTOMY;  Surgeon: Franchot Gallo, MD;  Location: [facility];  Service: Urology;  Laterality: Bilateral;   Family History  Problem Relation Age of Onset  . Tuberculosis Mother 91  . Other Father 91   Social History  Substance Use Topics  . Smoking  status: Former Smoker -- 0.25 packs/day for 30 years    Types: Cigarettes    Start date: 07/11/1992  . Smokeless tobacco: Never Used  . Alcohol Use: No    Review of Systems 10/14 systems reviewed and are negative other than those stated in the HPI   Allergies  Review of patient's allergies indicates no known allergies.  Home Medications   Prior to Admission medications   Medication Sig Start Date End Date Taking? Authorizing Provider  atenolol (TENORMIN) 100 MG tablet TAKE 1 TABLET BY MOUTH EVERY DAY Patient taking differently: TAKE 100 MG BY MOUTH EVERY DAY--- takes in am 08/10/15  Yes Dorena Dew, FNP  cephALEXin (KEFLEX) 500 MG capsule Take 1 capsule (500 mg total) by mouth 2 (two) times daily. 08/28/15  Yes Franchot Gallo, MD  hydrochlorothiazide (HYDRODIURIL) 25 MG tablet TAKE 1 TABLET BY MOUTH DAILY Patient taking differently: TAKE 1 TABLET BY MOUTH DAILY 10 am 05/11/15  Yes Dorena Dew, FNP  Multiple Vitamins-Minerals (MULTIVITAMIN WITH MINERALS) tablet Take 1 tablet by mouth every morning.    Yes Historical Provider, MD   BP 115/64 mmHg  Pulse 76  Temp(Src) 97.5 F (36.4 C) (Oral)  Resp 20  SpO2 93% Physical Exam Physical Exam  Nursing note and vitals reviewed. Constitutional: Elderly man, non-toxic, but in mild distress secondary to pain. Head: Normocephalic and atraumatic.  Mouth/Throat: Oropharynx is clear and moist.  Neck: Normal range of motion. Neck supple.  Cardiovascular: Normal rate and regular rhythm.   Pulmonary/Chest: Effort normal and breath sounds  normal.  Abdominal: Soft. There is suprapubic tenderness with mild low abdominal distension. There is no rebound and no guarding.  Musculoskeletal: Normal range of motion.  Neurological: Alert, no facial droop, fluent speech, moves all extremities symmetrically Skin: Skin is warm and dry.  Psychiatric: Cooperative  ED Course  Procedures (including critical care time) Labs Review Labs Reviewed   CBC WITH DIFFERENTIAL/PLATELET - Abnormal; Notable for the following:    WBC 15.5 (*)    RBC 3.75 (*)    Hemoglobin 11.3 (*)    HCT 33.1 (*)    Neutro Abs 11.6 (*)    Monocytes Absolute 1.3 (*)    All other components within normal limits  URINALYSIS, ROUTINE W REFLEX MICROSCOPIC (NOT AT [facility])  BASIC METABOLIC PANEL    Imaging Review No results found. I have personally reviewed and evaluated these images and lab results as part of my medical decision-making.   EKG Interpretation None      MDM   Final diagnoses:  Hematuria  S/P TURP  Urinary retention    82 year old male with history of metastatic prostate carcinoma status post TURP by Dr. Diona Fanti one day ago who presents with urinary retention and hematuria. Blood clots passing through urethrea on evaluation. 18 Fr Foley placed, with passage of dark red blood. Retaining >500 mL of urine. Sent for UA. Hgb drop from 15 one day ago to 11 today. Foley irrigated with 4-5 L saline with persistent hematuria and some but not complete clearance. Discussed with Dr. Risa Grill from Urology. Will admit to urology service for observation overnight.    Forde Dandy, MD 08/28/15 [phone]

## 2015-08-29 ENCOUNTER — Observation Stay (HOSPITAL_COMMUNITY): Payer: Medicare Other

## 2015-08-29 ENCOUNTER — Encounter (HOSPITAL_COMMUNITY): Payer: Self-pay | Admitting: *Deleted

## 2015-08-29 DIAGNOSIS — R0600 Dyspnea, unspecified: Secondary | ICD-10-CM | POA: Diagnosis not present

## 2015-08-29 DIAGNOSIS — N320 Bladder-neck obstruction: Secondary | ICD-10-CM | POA: Diagnosis present

## 2015-08-29 DIAGNOSIS — E872 Acidosis: Secondary | ICD-10-CM | POA: Diagnosis present

## 2015-08-29 DIAGNOSIS — R0902 Hypoxemia: Secondary | ICD-10-CM | POA: Diagnosis present

## 2015-08-29 DIAGNOSIS — C61 Malignant neoplasm of prostate: Secondary | ICD-10-CM | POA: Diagnosis present

## 2015-08-29 DIAGNOSIS — N183 Chronic kidney disease, stage 3 (moderate): Secondary | ICD-10-CM | POA: Diagnosis present

## 2015-08-29 DIAGNOSIS — R339 Retention of urine, unspecified: Secondary | ICD-10-CM | POA: Diagnosis not present

## 2015-08-29 DIAGNOSIS — Z9079 Acquired absence of other genital organ(s): Secondary | ICD-10-CM | POA: Diagnosis not present

## 2015-08-29 DIAGNOSIS — R739 Hyperglycemia, unspecified: Secondary | ICD-10-CM | POA: Diagnosis present

## 2015-08-29 DIAGNOSIS — C7951 Secondary malignant neoplasm of bone: Secondary | ICD-10-CM | POA: Diagnosis present

## 2015-08-29 DIAGNOSIS — R319 Hematuria, unspecified: Secondary | ICD-10-CM | POA: Diagnosis present

## 2015-08-29 DIAGNOSIS — I5033 Acute on chronic diastolic (congestive) heart failure: Secondary | ICD-10-CM | POA: Diagnosis not present

## 2015-08-29 DIAGNOSIS — D62 Acute posthemorrhagic anemia: Secondary | ICD-10-CM | POA: Diagnosis not present

## 2015-08-29 DIAGNOSIS — R338 Other retention of urine: Secondary | ICD-10-CM | POA: Diagnosis present

## 2015-08-29 DIAGNOSIS — I82811 Embolism and thrombosis of superficial veins of right lower extremities: Secondary | ICD-10-CM | POA: Diagnosis present

## 2015-08-29 DIAGNOSIS — I248 Other forms of acute ischemic heart disease: Secondary | ICD-10-CM | POA: Diagnosis not present

## 2015-08-29 DIAGNOSIS — N029 Recurrent and persistent hematuria with unspecified morphologic changes: Secondary | ICD-10-CM | POA: Diagnosis not present

## 2015-08-29 DIAGNOSIS — E875 Hyperkalemia: Secondary | ICD-10-CM | POA: Diagnosis present

## 2015-08-29 DIAGNOSIS — E870 Hyperosmolality and hypernatremia: Secondary | ICD-10-CM | POA: Diagnosis present

## 2015-08-29 DIAGNOSIS — N179 Acute kidney failure, unspecified: Secondary | ICD-10-CM | POA: Diagnosis present

## 2015-08-29 DIAGNOSIS — I130 Hypertensive heart and chronic kidney disease with heart failure and stage 1 through stage 4 chronic kidney disease, or unspecified chronic kidney disease: Secondary | ICD-10-CM | POA: Diagnosis present

## 2015-08-29 DIAGNOSIS — I10 Essential (primary) hypertension: Secondary | ICD-10-CM | POA: Diagnosis not present

## 2015-08-29 DIAGNOSIS — Z87898 Personal history of other specified conditions: Secondary | ICD-10-CM | POA: Diagnosis not present

## 2015-08-29 DIAGNOSIS — R7989 Other specified abnormal findings of blood chemistry: Secondary | ICD-10-CM | POA: Diagnosis not present

## 2015-08-29 DIAGNOSIS — N138 Other obstructive and reflux uropathy: Secondary | ICD-10-CM | POA: Diagnosis present

## 2015-08-29 DIAGNOSIS — Z87891 Personal history of nicotine dependence: Secondary | ICD-10-CM | POA: Diagnosis not present

## 2015-08-29 LAB — GLUCOSE, CAPILLARY
GLUCOSE-CAPILLARY: 118 mg/dL — AB (ref 65–99)
GLUCOSE-CAPILLARY: 122 mg/dL — AB (ref 65–99)
Glucose-Capillary: 111 mg/dL — ABNORMAL HIGH (ref 65–99)

## 2015-08-29 LAB — HEMOGLOBIN AND HEMATOCRIT, BLOOD
HCT: 28.9 % — ABNORMAL LOW (ref 39.0–52.0)
Hemoglobin: 9.7 g/dL — ABNORMAL LOW (ref 13.0–17.0)

## 2015-08-29 LAB — BASIC METABOLIC PANEL
ANION GAP: 11 (ref 5–15)
BUN: 39 mg/dL — AB (ref 6–20)
CO2: 19 mmol/L — AB (ref 22–32)
Calcium: 8.7 mg/dL — ABNORMAL LOW (ref 8.9–10.3)
Chloride: 102 mmol/L (ref 101–111)
Creatinine, Ser: 3.59 mg/dL — ABNORMAL HIGH (ref 0.61–1.24)
GFR calc Af Amer: 17 mL/min — ABNORMAL LOW (ref >60)
GFR calc non Af Amer: 14 mL/min — ABNORMAL LOW (ref >60)
GLUCOSE: 149 mg/dL — AB (ref 65–99)
POTASSIUM: 5.3 mmol/L — AB (ref 3.5–5.1)
Sodium: 132 mmol/L — ABNORMAL LOW (ref 135–145)

## 2015-08-29 LAB — ABO/RH: ABO/RH(D): A POS

## 2015-08-29 LAB — PREPARE RBC (CROSSMATCH)

## 2015-08-29 MED ORDER — SODIUM CHLORIDE 0.9 % IV SOLN
Freq: Once | INTRAVENOUS | Status: AC
  Administered 2015-08-29: 16:00:00 via INTRAVENOUS

## 2015-08-29 MED ORDER — KCL IN DEXTROSE-NACL 20-5-0.45 MEQ/L-%-% IV SOLN
INTRAVENOUS | Status: DC
  Administered 2015-08-29: 03:00:00 via INTRAVENOUS
  Filled 2015-08-29 (×3): qty 1000

## 2015-08-29 MED ORDER — SODIUM CHLORIDE 0.9 % IV BOLUS (SEPSIS)
500.0000 mL | Freq: Once | INTRAVENOUS | Status: AC
  Administered 2015-08-29: 500 mL via INTRAVENOUS

## 2015-08-29 MED ORDER — SODIUM CHLORIDE 0.45 % IV SOLN
INTRAVENOUS | Status: DC
  Administered 2015-08-29: 09:00:00 via INTRAVENOUS

## 2015-08-29 MED ORDER — HYDROCODONE-ACETAMINOPHEN 5-325 MG PO TABS
1.0000 | ORAL_TABLET | ORAL | Status: DC | PRN
  Filled 2015-08-29: qty 1

## 2015-08-29 MED ORDER — SODIUM CHLORIDE 0.9 % IV SOLN
INTRAVENOUS | Status: DC
  Administered 2015-08-29 – 2015-08-30 (×5): via INTRAVENOUS

## 2015-08-29 MED ORDER — INSULIN ASPART 100 UNIT/ML ~~LOC~~ SOLN
0.0000 [IU] | Freq: Three times a day (TID) | SUBCUTANEOUS | Status: DC
  Administered 2015-08-29: 1 [IU] via SUBCUTANEOUS

## 2015-08-29 NOTE — Consult Note (Addendum)
[facility] History and Physical  Gregory [name] [id number] DOB: [birth year] DOA: 08/28/2015  Referring physician:   PCP: Elizabeth Palau, MD   Reason for consultation: *Acute renal failure  HPI:  82 year old male with a history of hypertension, BPH, metastatic prostate cancer with urinary retention, indwelling Foley catheter for the last 4-5 weeks with plans for TURP on 2/16, on finasteride, Casodex, Flomax, who is being seen today for worsening renal function. Patient recently diagnosed with metastatic adenocarcinoma, seen by urology and 07/28/15. Patient had CT abdomen pelvis with contrast on 08/12/15 at the urology office. During that time his creatinine was around 1.0. Post TURP on 2/16. Patient's creatinine was 3.05> now 3.59. Patient has gross hematuria and is on continuous bladder irrigation. Unable to strictly calculate I's and O's. Hemoglobin dropped from 15.0-9.7 today. Urine appears to be bloody but negative for UTI. Patient is followed by  Dorena Dew, FNP who is his primary care. Patient currently denies any pain from his bony metastasis.    Review of Systems: negative for the following  Constitutional: Denies fever, chills, diaphoresis, appetite change and fatigue.  HEENT: Denies photophobia, eye pain, redness, hearing loss, ear pain, congestion, sore throat, rhinorrhea, sneezing, mouth sores, trouble swallowing, neck pain, neck stiffness and tinnitus.  Respiratory: Denies SOB, DOE, cough, chest tightness, and wheezing.  Cardiovascular: Denies chest pain, palpitations and leg swelling.  Gastrointestinal: Denies nausea, vomiting, abdominal pain, diarrhea, constipation, blood in stool and abdominal distention.  Genitourinary: Denies dysuria, urgency, frequency, positive for hematuria, flank pain and difficulty urinating.  Musculoskeletal: Denies myalgias, back pain, joint swelling, arthralgias and gait problem.  Skin: Denies pallor, rash and wound.  Neurological:  Denies dizziness, seizures, syncope, weakness, light-headedness, numbness and headaches.  Hematological: Denies adenopathy. Easy bruising, personal or family bleeding history  Psychiatric/Behavioral: Denies suicidal ideation, mood changes, confusion, nervousness, sleep disturbance and agitation       Past Medical History  Diagnosis Date  . Hypertension   . Foley catheter in place     Urinary Retension  . BPH (benign prostatic hyperplasia)   . Prostate cancer [facility])   urologist- dr dahlstedt/  oncologist-  dr Ander Slade    Gleason 4+5,  PSA 18.74,  w/  Visceral and Extensive Bones METS--  pallitive radiation therapy and hormone therapy  . Bone metastases ([place])   . GERD (gastroesophageal reflux disease)      Past Surgical History  Procedure Laterality Date  . Prostate biopsy  1/16 /2017  . Multiple tooth extractions  1980's  . Transurethral resection of prostate N/A 08/27/2015    Procedure: TRANSURETHRAL RESECTION OF THE PROSTATE WITH GYRUS INSTRUMENTS;  Surgeon: Franchot Gallo, MD;  Location: [facility];  Service: Urology;  Laterality: N/A;  . Orchiectomy Bilateral 08/27/2015    Procedure: ORCHIECTOMY;  Surgeon: Franchot Gallo, MD;  Location: [facility];  Service: Urology;  Laterality: Bilateral;      Social History:  reports that he has quit smoking. His smoking use included Cigarettes. He started smoking about 23 years ago. He has a 7.5 pack-year smoking history. He has never used smokeless tobacco. He reports that he does not drink alcohol or use illicit drugs.    No Known Allergies  Family History  Problem Relation Age of Onset  . Tuberculosis Mother 18  . Other Father 47        Prior to Admission medications   Medication Sig Start Date End Date Taking? Authorizing Provider  atenolol (TENORMIN) 100 MG tablet TAKE  1 TABLET BY MOUTH EVERY DAY Patient taking differently: TAKE 100 MG BY MOUTH EVERY DAY--- takes in am 08/10/15  Yes Dorena Dew, FNP  cephALEXin (KEFLEX) 500 MG capsule Take 1 capsule (500 mg total) by mouth 2 (two) times daily. 08/28/15  Yes Franchot Gallo, MD  hydrochlorothiazide (HYDRODIURIL) 25 MG tablet TAKE 1 TABLET BY MOUTH DAILY Patient taking differently: TAKE 1 TABLET BY MOUTH DAILY 10 am 05/11/15  Yes Dorena Dew, FNP  Multiple Vitamins-Minerals (MULTIVITAMIN WITH MINERALS) tablet Take 1 tablet by mouth every morning.    Yes Historical Provider, MD     Physical Exam: Filed Vitals:   08/29/15 0130 08/29/15 0145 08/29/15 0222 08/29/15 0555  BP: 104/56  131/67 105/62  Pulse: 64 65 69 69  Temp:   98 F (36.7 C) 98.4 F (36.9 C)  TempSrc:   Oral Oral  Resp: 12 13 20 16   Height:   6' (1.829 m)   SpO2: 89% 93% 100% 100%     Constitutional: Vital signs reviewed. Patient is a well-developed and well-nourished in no acute distress and cooperative with exam. Alert and oriented x3.  Head: Normocephalic and atraumatic  Ear: TM normal bilaterally  Mouth: no erythema or exudates, MMM  Eyes: PERRL, EOMI, conjunctivae normal, No scleral icterus.  Neck: Supple, Trachea midline normal ROM, No JVD, mass, thyromegaly, or carotid bruit present.  Cardiovascular: RRR, S1 normal, S2 normal, no MRG, pulses symmetric and intact bilaterally  Pulmonary/Chest: CTAB, no wheezes, rales, or rhonchi  Abdominal: Soft. Non-tender, non-distended, bowel sounds are normal, no masses, organomegaly, or guarding present.  GU: no CVA tenderness Musculoskeletal: No joint deformities, erythema, or stiffness, ROM full and no nontender Ext: no edema and no cyanosis, pulses palpable bilaterally (DP and PT)  Hematology: no cervical, inginal, or axillary adenopathy.  Neurological: A&O x3, Strenght is normal and symmetric bilaterally, cranial nerve II-XII are grossly intact, no focal motor deficit, sensory intact to light touch bilaterally.  Skin: Warm, dry and intact. No rash, cyanosis, or clubbing.  Psychiatric: Normal mood  and affect. speech and behavior is normal. Judgment and thought content normal. Cognition and memory are normal.      Data Review   Micro Results No results found for this or any previous visit (from the past 240 hour(s)).  Radiology Reports Nm Bone Scan Whole Body  08/12/2015  CLINICAL DATA:  Prostate cancer. EXAM: NUCLEAR MEDICINE WHOLE BODY BONE SCAN TECHNIQUE: Whole body anterior and posterior images were obtained approximately 3 hours after intravenous injection of radiopharmaceutical. RADIOPHARMACEUTICALS:  26 mCi Technetium-46m MDP IV COMPARISON:  CT abdomen pelvis 08/12/2015. FINDINGS: Numerous foci of increased uptake are seen in the humeri, sternum, ribs, spine, pelvis and proximal femora. Many of these foci of uptake correspond to sclerotic lesions on CT performed today. Uptake is most intense in the proximal right femur. IMPRESSION: Diffuse osseous metastatic disease involving the axial and appendicular skeleton. Uptake is most intense in the proximal right femur. As suggested on CT performed earlier today, patient is likely at increased risk for pathologic fracture. Electronically Signed   By: Lorin Picket M.D.   On: 08/12/2015 13:23     CBC  Recent Labs Lab 08/27/15 1246 08/28/15 2023 08/29/15 0556  WBC  --  15.5*  --   HGB 15.0 11.3* 9.7*  HCT 44.0 33.1* 28.9*  PLT  --  166  --   MCV  --  88.3  --   MCH  --  30.1  --  MCHC  --  34.1  --   RDW  --  14.5  --   LYMPHSABS  --  2.6  --   MONOABS  --  1.3*  --   EOSABS  --  0.1  --   BASOSABS  --  0.0  --     Chemistries   Recent Labs Lab 08/27/15 1246 08/28/15 2246 08/29/15 0556  NA 141 128* 132*  K 4.4 4.2 5.3*  CL  --  98* 102  CO2  --  22 19*  GLUCOSE 126* 188* 149*  BUN  --  36* 39*  CREATININE  --  3.05* 3.59*  CALCIUM  --  8.6* 8.7*   ------------------------------------------------------------------------------------------------------------------ estimated creatinine clearance is 16.8 mL/min  (by C-G formula based on Cr of 3.59). ------------------------------------------------------------------------------------------------------------------ No results for input(s): HGBA1C in the last 72 hours. ------------------------------------------------------------------------------------------------------------------ No results for input(s): CHOL, HDL, LDLCALC, TRIG, CHOLHDL, LDLDIRECT in the last 72 hours. ------------------------------------------------------------------------------------------------------------------ No results for input(s): TSH, T4TOTAL, T3FREE, THYROIDAB in the last 72 hours.  Invalid input(s): FREET3 ------------------------------------------------------------------------------------------------------------------ No results for input(s): VITAMINB12, FOLATE, FERRITIN, TIBC, IRON, RETICCTPCT in the last 72 hours.  Coagulation profile No results for input(s): INR, PROTIME in the last 168 hours.  No results for input(s): DDIMER in the last 72 hours.  Cardiac Enzymes No results for input(s): CKMB, TROPONINI, MYOGLOBIN in the last 168 hours.  Invalid input(s): CK ------------------------------------------------------------------------------------------------------------------ Invalid input(s): POCBNP   CBG: No results for input(s): GLUCAP in the last 168 hours.     EKG: Independently reviewed.     Assessment/Plan Acute kidney injury likely secondary to chronic bladder outlet obstruction CT scan consistent with chronic bladder outlet obstruction Now with hematuria, contributing to obstructive uropathy Increase IV fluids to 125 mL per hour and continue bladder irrigation We'll check Renal ultrasound to rule out hydronephrosis    Prostate cancer (HCC)-metastatic to the bone, will defer further management to urology Continue Flomax    Hematuria-hemoglobin is down from 15-9.7, will transfuse 1 unit of packed red blood cells  Hyperglycemia-hemoglobin A1c  6.2 consistent with prediabetes, will start patient on Accu-Cheks  Essential hypertension-blood pressure soft, hold antihypertensive medications, resume atenolol and systolic blood pressure greater than 140  Hyperkalemia-likely in the setting of acute renal failure-this should improve, DC'd  IV fluids that contain potassium      Code Status Orders        Start     Ordered   08/29/15 0003  Full code   Continuous     08/29/15 0005       Family Communication: Discussed with the patient by the bedside in the presence of RN Disposition Plan: admit   Total time spent 55 minutes.Greater than 50% of this time was spent in counseling, explanation of diagnosis, planning of further management, and coordination of care  [facility] Pager [phone]  If 7PM-7AM, please contact night-coverage [email or web address] Password [facility] 08/29/2015, 9:46 AM

## 2015-08-29 NOTE — Progress Notes (Signed)
Urology Progress Note  Metastatic G-9 CaP, readmitted post TURP/orchiectomy for metastaticG-9 CaP. PDA 18  Subjective:     No acute urologic events overnight. Ambulation:   positive Flatus:    positive Bowel movement  negative  Pain: some relief  Objective:  Blood pressure 105/62, pulse 69, temperature 98.4 F (36.9 C), temperature source Oral, resp. rate 16, height 6' (1.829 m), SpO2 100 %.  Physical Exam:  General:  No acute distress, awake Resp: clear to auscultation bilaterally Genitourinary:  wound looks good Foley: pink urine    I/O last 3 completed shifts: In: 10700 [Other:10700] Out: 92230 [Urine:15660]  Recent Labs     08/28/15  2023  08/29/15  0556  HGB  11.3*  9.7*  WBC  15.5*   --   PLT  166   --     Recent Labs     08/28/15  2246  08/29/15  0556  NA  128*  132*  K  4.2  5.3*  CL  98*  102  CO2  22  19*  BUN  36*  39*  CREATININE  3.05*  3.59*  CALCIUM  8.6*  8.7*  GFRNONAA  17*  14*  GFRAA  20*  17*     No results for input(s): INR, APTT in the last 72 hours.  Invalid input(s): PT   Invalid input(s): ABG  Assessment/Plan: Note decrease in Hg/. Will T/s. Repeat h/h, B Met in AM  Medicine consult for renal failure

## 2015-08-30 DIAGNOSIS — E875 Hyperkalemia: Secondary | ICD-10-CM | POA: Diagnosis present

## 2015-08-30 DIAGNOSIS — R739 Hyperglycemia, unspecified: Secondary | ICD-10-CM

## 2015-08-30 DIAGNOSIS — N179 Acute kidney failure, unspecified: Secondary | ICD-10-CM | POA: Diagnosis present

## 2015-08-30 LAB — RENAL FUNCTION PANEL
ALBUMIN: 2.9 g/dL — AB (ref 3.5–5.0)
Anion gap: 8 (ref 5–15)
BUN: 44 mg/dL — AB (ref 6–20)
CO2: 16 mmol/L — ABNORMAL LOW (ref 22–32)
Calcium: 7.5 mg/dL — ABNORMAL LOW (ref 8.9–10.3)
Chloride: 114 mmol/L — ABNORMAL HIGH (ref 101–111)
Creatinine, Ser: 4.86 mg/dL — ABNORMAL HIGH (ref 0.61–1.24)
GFR calc Af Amer: 11 mL/min — ABNORMAL LOW (ref >60)
GFR calc non Af Amer: 10 mL/min — ABNORMAL LOW (ref >60)
GLUCOSE: 145 mg/dL — AB (ref 65–99)
PHOSPHORUS: 2.5 mg/dL (ref 2.5–4.6)
POTASSIUM: 4.7 mmol/L (ref 3.5–5.1)
SODIUM: 138 mmol/L (ref 135–145)

## 2015-08-30 LAB — COMPREHENSIVE METABOLIC PANEL
ALBUMIN: 2.9 g/dL — AB (ref 3.5–5.0)
ALK PHOS: 215 U/L — AB (ref 38–126)
ALT: 14 U/L — AB (ref 17–63)
AST: 35 U/L (ref 15–41)
Anion gap: 6 (ref 5–15)
BUN: 43 mg/dL — ABNORMAL HIGH (ref 6–20)
CALCIUM: 7.9 mg/dL — AB (ref 8.9–10.3)
CO2: 20 mmol/L — AB (ref 22–32)
CREATININE: 4.18 mg/dL — AB (ref 0.61–1.24)
Chloride: 114 mmol/L — ABNORMAL HIGH (ref 101–111)
GFR calc Af Amer: 14 mL/min — ABNORMAL LOW (ref >60)
GFR calc non Af Amer: 12 mL/min — ABNORMAL LOW (ref >60)
GLUCOSE: 107 mg/dL — AB (ref 65–99)
Potassium: 4.1 mmol/L (ref 3.5–5.1)
SODIUM: 140 mmol/L (ref 135–145)
Total Bilirubin: 0.8 mg/dL (ref 0.3–1.2)
Total Protein: 5 g/dL — ABNORMAL LOW (ref 6.5–8.1)

## 2015-08-30 LAB — CBC
HCT: 27.8 % — ABNORMAL LOW (ref 39.0–52.0)
HEMOGLOBIN: 9.4 g/dL — AB (ref 13.0–17.0)
MCH: 30.3 pg (ref 26.0–34.0)
MCHC: 33.8 g/dL (ref 30.0–36.0)
MCV: 89.7 fL (ref 78.0–100.0)
Platelets: 121 10*3/uL — ABNORMAL LOW (ref 150–400)
RBC: 3.1 MIL/uL — AB (ref 4.22–5.81)
RDW: 15 % (ref 11.5–15.5)
WBC: 12.3 10*3/uL — AB (ref 4.0–10.5)

## 2015-08-30 LAB — GLUCOSE, CAPILLARY: GLUCOSE-CAPILLARY: 118 mg/dL — AB (ref 65–99)

## 2015-08-30 MED ORDER — SODIUM CHLORIDE 0.45 % IV SOLN
INTRAVENOUS | Status: DC
Start: 2015-08-30 — End: 2015-08-31
  Administered 2015-08-30 – 2015-08-31 (×3): via INTRAVENOUS

## 2015-08-30 MED ORDER — PHENAZOPYRIDINE HCL 100 MG PO TABS
100.0000 mg | ORAL_TABLET | Freq: Three times a day (TID) | ORAL | Status: DC
  Administered 2015-08-30 – 2015-08-31 (×2): 100 mg via ORAL
  Filled 2015-08-30 (×4): qty 1

## 2015-08-30 MED ORDER — ONDANSETRON HCL 4 MG/2ML IJ SOLN: 4.0000 mg | Freq: Four times a day (QID) | INTRAMUSCULAR | Status: DC | PRN

## 2015-08-30 MED ORDER — LACTATED RINGERS IV SOLN: INTRAVENOUS | Status: DC

## 2015-08-30 MED ORDER — POLYETHYLENE GLYCOL 3350 17 G PO PACK
17.0000 g | PACK | Freq: Every day | ORAL | Status: DC
  Administered 2015-08-30 – 2015-09-05 (×4): 17 g via ORAL
  Filled 2015-08-30 (×7): qty 1

## 2015-08-30 MED ORDER — SODIUM BICARBONATE 650 MG PO TABS
650.0000 mg | ORAL_TABLET | Freq: Two times a day (BID) | ORAL | Status: DC
  Administered 2015-08-30: 650 mg via ORAL
  Filled 2015-08-30 (×2): qty 1

## 2015-08-30 MED ORDER — ACETAMINOPHEN 325 MG PO TABS
650.0000 mg | ORAL_TABLET | Freq: Four times a day (QID) | ORAL | Status: DC | PRN
  Administered 2015-09-01: 650 mg via ORAL
  Filled 2015-08-30: qty 2

## 2015-08-30 NOTE — Progress Notes (Signed)
  Radiation Oncology         [phone] ________________________________  Name: Gregory [name] MRN: [id number]  Date: 08/28/2015  DOB: 1929-01-30  Chart Note:  I met with this patient earlier and offered palliative RT for a right proximal femur met from prostate cancer.  During our visit, he was interested in proceeding.  However, earlier today, he elected not to receive RT at this time.  I would be happy to see him again in 4-6 weeks for follow-up to discuss ongoing options later.     ________________________________  Sheral Apley. Tammi Klippel, M.D.

## 2015-08-30 NOTE — Progress Notes (Signed)
Urology Progress Note TURP, orchiectomy.ARF, with Medicine consult   Subjective:     AKI, Rx IM.  Ambulation:   negative Flatus:    negative Bowel movement  negative  Pain: some relief  Objective:  Blood pressure 107/51, pulse 76, temperature 98.4 F (36.9 C), temperature source Oral, resp. rate 18, height 6' (1.829 m), SpO2 100 %.  Physical Exam:  General:  No acute distress, awake Extremities: extremities normal, atraumatic, no cyanosis or edema Genitourinary:  neg Foley: remains. Urine clear.     I/O last 3 completed shifts: In: 41642.1 [P.O.:120; I.V.:2287.1; Blood:335; [id number] Out: 49010 [Urine:49010]  Recent Labs     08/28/15  2023  08/29/15  0556  08/30/15  0607  HGB  11.3*  9.7*  9.4*  WBC  15.5*   --   12.3*  PLT  166   --   121*    Recent Labs     08/29/15  0556  08/30/15  0607  NA  132*  140  K  5.3*  4.1  CL  102  114*  CO2  19*  20*  BUN  39*  43*  CREATININE  3.59*  4.18*  CALCIUM  8.7*  7.9*  GFRNONAA  14*  12*  GFRAA  17*  14*     No results for input(s): INR, APTT in the last 72 hours.  Invalid input(s): PT   Invalid input(s): ABG  Assessment/Plan:  Catheter not removed. Follow-up with IM consult for AKI.  Social Service consultation. Daughter to talk with Social service today.

## 2015-08-30 NOTE — Progress Notes (Addendum)
[facility] Progress Note  Patient: Gregory [name] [id number]   PCP: Elizabeth Palau, MD DOB: [birth year]   DOA: 08/28/2015   DOS: 08/30/2015   Date of Service: the patient was seen and examined on 08/30/2015 Primary service: Franchot Gallo, MD   Subjective: Patient complains of catheter irritation. No nausea no vomiting no abdominal pain no shortness of breath. Nutrition: Tolerating oral diet Activity: Mostly bedridden Last BM: 08/30/2015  Assessment and Plan: 1. S/P TURP Patient recently diagnosed with poorly differentiated adenocarcinoma of the prostate, status post TURP. Developed gross hematuria. Currently receiving CBI. Management per urology.  2. Acute kidney injury with hyperkalemia. Most like secondary to obstruction from hematuria. We'll get further workup today although expectation is that her renal function will stabilize soon. Avoid nephrotoxic medications. Hyperkalemia has resolved. Addendum: Repeat BMP shows worsening acidosis with s creat. Will increase IV fluid rate and place on bicarb tablets. If worsens further tomorrow then will need nephrology input.   Farheen Pfahler 6:21 PM 08/30/2015    3. Hyperglycemia. The sugars remained stable at present. Currently would hold off on sliding scale insulin as well as CBG. Check glucose daily morning.  4. Hematuria with acute blood loss anemia. Patient received one unit of PRBC. Continue monitoring H&H.  5. Essential hypertension. Blood pressure lower side. Will check orthostatic. May need fluid bolus. Holding blood pressure medications.   DVT Prophylaxis: mechanical compression device Nutrition: regular diet Advance goals of care discussion: full code  Brief Summary of Hospitalization:  HPI: As per the note dictated on day of consult, "82 year old male with a history of hypertension, BPH, metastatic prostate cancer with urinary retention, indwelling Foley catheter for the last 4-5 weeks with plans  for TURP on 2/16, on finasteride, Casodex, Flomax, who is being seen today for worsening renal function. Patient recently diagnosed with metastatic adenocarcinoma, seen by urology and 07/28/15. Patient had CT abdomen pelvis with contrast on 08/12/15 at the urology office. During that time his creatinine was around 1.0. Post TURP on 2/16. Patient's creatinine was 3.05> now 3.59. Patient has gross hematuria and is on continuous bladder irrigation. Unable to strictly calculate I's and O's. Hemoglobin dropped from 15.0-9.7 today. Urine appears to be bloody but negative for UTI. Patient is followed by Dorena Dew, FNP who is his primary care. Patient currently denies any pain from his bony metastasis." Daily update, Procedures: none Antibiotics: Anti-infectives    None      Family Communication: no family was present at bedside, at the time of interview.   Disposition:  Barriers to safe discharge: Stabilization of the renal function as well as actual monitoring of the urine output once CBI is  on hold   Intake/Output Summary (Last 24 hours) at 08/30/15 1534 Last data filed at 08/30/15 1500  Gross per 24 hour  Intake 20327.5 ml  Output  20300 ml  Net   27.5 ml   Filed Weights    Objective: Physical Exam: Filed Vitals:   08/29/15 1641 08/29/15 1929 08/30/15 0523 08/30/15 1342  BP: 119/55 119/56 107/51 154/72  Pulse: 72 73 76 80  Temp: 98 F (36.7 C) 98.9 F (37.2 C) 98.4 F (36.9 C) 98.2 F (36.8 C)  TempSrc: Oral Oral Oral Oral  Resp: 18 18 18 18   Height:      SpO2: 100% 100% 100% 100%     General: Appear in mild distress, no Rash; Oral Mucosa moist. Cardiovascular: S1 and S2 Present, no Murmur, no JVD Respiratory: Bilateral Air entry  present and Clear to Auscultation, no Crackles, no wheezes Abdomen: Bowel Sound present, Soft and no tenderness Extremities: no Pedal edema, no calf tenderness Neurology: Grossly no focal neuro deficit.  Data Reviewed: CBC:  Recent  Labs Lab 08/27/15 1246 08/28/15 2023 08/29/15 0556 08/30/15 0607  WBC  --  15.5*  --  12.3*  NEUTROABS  --  11.6*  --   --   HGB 15.0 11.3* 9.7* 9.4*  HCT 44.0 33.1* 28.9* 27.8*  MCV  --  88.3  --  89.7  PLT  --  166  --  123XX123*   Basic Metabolic Panel:  Recent Labs Lab 08/27/15 1246 08/28/15 2246 08/29/15 0556 08/30/15 0607  NA 141 128* 132* 140  K 4.4 4.2 5.3* 4.1  CL  --  98* 102 114*  CO2  --  22 19* 20*  GLUCOSE 126* 188* 149* 107*  BUN  --  36* 39* 43*  CREATININE  --  3.05* 3.59* 4.18*  CALCIUM  --  8.6* 8.7* 7.9*   Liver Function Tests:  Recent Labs Lab 08/30/15 0607  AST 35  ALT 14*  ALKPHOS 215*  BILITOT 0.8  PROT 5.0*  ALBUMIN 2.9*   No results for input(s): LIPASE, AMYLASE in the last 168 hours. No results for input(s): AMMONIA in the last 168 hours.  Cardiac Enzymes: No results for input(s): CKTOTAL, CKMB, CKMBINDEX, TROPONINI in the last 168 hours. BNP (last 3 results) No results for input(s): BNP in the last 8760 hours.  CBG:  Recent Labs Lab 08/29/15 1117 08/29/15 1605 08/29/15 2127 08/30/15 0802  GLUCAP 118* 122* 111* 118*    No results found for this or any previous visit (from the past 240 hour(s)).   Studies: No results found.   Scheduled Meds: . lidocaine  1 application Urethral Once  . polyethylene glycol  17 g Oral Daily   Continuous Infusions: . sodium chloride 100 mL/hr at 08/30/15 1202   PRN Meds: acetaminophen, HYDROcodone-acetaminophen, ondansetron (ZOFRAN) IV  Time spent: 30 minutes  Author: Berle Mull, MD [facility] Pager: [phone] 08/30/2015 3:34 PM  If 7PM-7AM, please contact night-coverage at [email or web address], password [facility]

## 2015-08-31 LAB — RENAL FUNCTION PANEL
ALBUMIN: 2.9 g/dL — AB (ref 3.5–5.0)
ANION GAP: 7 (ref 5–15)
BUN: 41 mg/dL — AB (ref 6–20)
CALCIUM: 8.2 mg/dL — AB (ref 8.9–10.3)
CO2: 20 mmol/L — AB (ref 22–32)
Chloride: 118 mmol/L — ABNORMAL HIGH (ref 101–111)
Creatinine, Ser: 4.23 mg/dL — ABNORMAL HIGH (ref 0.61–1.24)
GFR calc Af Amer: 14 mL/min — ABNORMAL LOW (ref >60)
GFR calc non Af Amer: 12 mL/min — ABNORMAL LOW (ref >60)
GLUCOSE: 152 mg/dL — AB (ref 65–99)
PHOSPHORUS: 2.3 mg/dL — AB (ref 2.5–4.6)
POTASSIUM: 4.1 mmol/L (ref 3.5–5.1)
SODIUM: 145 mmol/L (ref 135–145)

## 2015-08-31 LAB — TYPE AND SCREEN
ABO/RH(D): A POS
ANTIBODY SCREEN: NEGATIVE
Unit division: 0

## 2015-08-31 LAB — HEMOGLOBIN A1C
Hgb A1c MFr Bld: 6.2 % — ABNORMAL HIGH (ref 4.8–5.6)
MEAN PLASMA GLUCOSE: 131 mg/dL

## 2015-08-31 LAB — HEMOGLOBIN AND HEMATOCRIT, BLOOD
HCT: 27.3 % — ABNORMAL LOW (ref 39.0–52.0)
Hemoglobin: 9.2 g/dL — ABNORMAL LOW (ref 13.0–17.0)

## 2015-08-31 MED ORDER — SODIUM BICARBONATE 8.4 % IV SOLN
INTRAVENOUS | Status: DC
  Administered 2015-08-31 – 2015-09-01 (×4): via INTRAVENOUS
  Filled 2015-08-31 (×4): qty 50

## 2015-08-31 NOTE — Progress Notes (Signed)
[facility] Progress Note  Patient: Gregory [name] [id number]   PCP: Elizabeth Palau, MD DOB: 15-Dec-1928   DOA: 08/28/2015   DOS: 08/31/2015   Date of Service: the patient was seen and examined on 08/31/2015 Primary service: Franchot Gallo, MD   Subjective: Patient does not have any acute complaint. No nausea no vomiting. Complain of fatigue Nutrition: Tolerating oral diet Activity: Mostly bedridden Last BM: 08/30/2015  Assessment and Plan: 1. S/P TURP Patient recently diagnosed with poorly differentiated adenocarcinoma of the prostate, status post TURP. Developed gross hematuria. Currently receiving CBI. Management per urology.  2. Acute kidney injury with hyperkalemia. Most like secondary to obstruction from hematuria. Cannot get further workup due to patient's CBI status. Given worsening of the renal function nephrology has been consulted to assist in the management. Appreciate input from Dr. Jonnie Finner. Will discuss with primary team urology in the morning.  Avoid nephrotoxic medications. Hyperkalemia has resolved.  3. Hyperglycemia. The sugars remained stable at present. Currently would hold off on sliding scale insulin as well as CBG. Check glucose daily morning.  4. Hematuria with acute blood loss anemia. Patient received one unit of PRBC. currently H&H remained stable recommend to monitor  Continue monitoring H&H.  5. Essential hypertension. Blood pressurstable without medication Holding blood pressure medications.   DVT Prophylaxis: mechanical compression device Nutrition: regular diet Advance goals of care discussion: full code  Brief Summary of Hospitalization:  HPI: As per the note dictated on day of consult, "82 year old male with a history of hypertension, BPH, metastatic prostate cancer with urinary retention, indwelling Foley catheter for the last 4-5 weeks with plans for TURP on 2/16, on finasteride, Casodex, Flomax, who is being seen today  for worsening renal function. Patient recently diagnosed with metastatic adenocarcinoma, seen by urology and 07/28/15. Patient had CT abdomen pelvis with contrast on 08/12/15 at the urology office. During that time his creatinine was around 1.0. Post TURP on 2/16. Patient's creatinine was 3.05> now 3.59. Patient has gross hematuria and is on continuous bladder irrigation. Unable to strictly calculate I's and O's. Hemoglobin dropped from 15.0-9.7 today. Urine appears to be bloody but negative for UTI. Patient is followed by Dorena Dew, FNP who is his primary care. Patient currently denies any pain from his bony metastasis." Daily update, Procedures: none Antibiotics: Anti-infectives    None      Family Communication: no family was present at bedside, at the time of interview.   Disposition:  Barriers to safe discharge: Stabilization of the renal function as well as actual monitoring of the urine output once CBI is  on hold   Intake/Output Summary (Last 24 hours) at 08/31/15 1937 Last data filed at 08/31/15 1900  Gross per 24 hour  Intake 8407.5 ml  Output   6525 ml  Net 1882.5 ml   Filed Weights   08/30/15 1600 08/31/15 0603  Weight: 97.433 kg (214 lb 12.8 oz) 94.62 kg (208 lb 9.6 oz)    Objective: Physical Exam: Filed Vitals:   08/30/15 1600 08/30/15 2100 08/31/15 0603 08/31/15 1447  BP:  114/85 137/72 141/72  Pulse:  78 77 82  Temp:  99 F (37.2 C) 98 F (36.7 C) 98.2 F (36.8 C)  TempSrc:  Oral Oral Oral  Resp:  16 20 18   Height:      Weight: 97.433 kg (214 lb 12.8 oz)  94.62 kg (208 lb 9.6 oz)   SpO2:  100% 100% 100%    General: Appear in mild distress, no  Rash; Oral Mucosa moist. Cardiovascular: S1 and S2 Present, no Murmur, no JVD Respiratory: Bilateral Air entry present and Clear to Auscultation, no Crackles, no wheezes Abdomen: Bowel Sound present, Soft and no tenderness Extremities: no Pedal edema, no calf tenderness  Data Reviewed: CBC:  Recent Labs Lab  08/27/15 1246 08/28/15 2023 08/29/15 0556 08/30/15 0607 08/31/15 0802  WBC  --  15.5*  --  12.3*  --   NEUTROABS  --  11.6*  --   --   --   HGB 15.0 11.3* 9.7* 9.4* 9.2*  HCT 44.0 33.1* 28.9* 27.8* 27.3*  MCV  --  88.3  --  89.7  --   PLT  --  166  --  121*  --    Basic Metabolic Panel:  Recent Labs Lab 08/28/15 2246 08/29/15 0556 08/30/15 0607 08/30/15 1555 08/31/15 0802  NA 128* 132* 140 138 145  K 4.2 5.3* 4.1 4.7 4.1  CL 98* 102 114* 114* 118*  CO2 22 19* 20* 16* 20*  GLUCOSE 188* 149* 107* 145* 152*  BUN 36* 39* 43* 44* 41*  CREATININE 3.05* 3.59* 4.18* 4.86* 4.23*  CALCIUM 8.6* 8.7* 7.9* 7.5* 8.2*  PHOS  --   --   --  2.5 2.3*   Liver Function Tests:  Recent Labs Lab 08/30/15 0607 08/30/15 1555 08/31/15 0802  AST 35  --   --   ALT 14*  --   --   ALKPHOS 215*  --   --   BILITOT 0.8  --   --   PROT 5.0*  --   --   ALBUMIN 2.9* 2.9* 2.9*   No results for input(s): LIPASE, AMYLASE in the last 168 hours. No results for input(s): AMMONIA in the last 168 hours.  Cardiac Enzymes: No results for input(s): CKTOTAL, CKMB, CKMBINDEX, TROPONINI in the last 168 hours. BNP (last 3 results) No results for input(s): BNP in the last 8760 hours.  CBG:  Recent Labs Lab 08/29/15 1117 08/29/15 1605 08/29/15 2127 08/30/15 0802  GLUCAP 118* 122* 111* 118*    No results found for this or any previous visit (from the past 240 hour(s)).   Studies: No results found.   Scheduled Meds: . lidocaine  1 application Urethral Once  . polyethylene glycol  17 g Oral Daily   Continuous Infusions: .  sodium bicarbonate  infusion 1000 mL 150 mL/hr at 08/31/15 1914   PRN Meds: acetaminophen, HYDROcodone-acetaminophen, ondansetron (ZOFRAN) IV  Time spent: 30 minutes  Author: Berle Mull, MD [facility] Pager: [phone] 08/31/2015 7:37 PM  If 7PM-7AM, please contact night-coverage at [email or web address], password [facility]

## 2015-08-31 NOTE — Consult Note (Signed)
Renal Service Consult Note [facility]  Gregory [name] 08/31/2015 Gregory [name] Requesting Physician:  Dr Gregory [name]  Reason for Consult:  Acute on CKD3 HPI: The patient is a 82 y.o. year-old with hx of HTN 80 yrs, BPH recently dx'd with prostate cancer. Had CT w constrast on Feb 1st showing normal kidneys, diffuse osseous bony mets and enlarged prostate w bladd wall thickening. Admitted for surgery and underwent bilat orch/ cysto/ TURP on Feb 16th. The next am the creat was 3.0. The last creatinine here prior to 2/17 was on Jul 18, 2015 at 1.00.  Post op the creat has gone up to 4.18 then 4.23 today. He is getting CBI for hematuria and UOP not reliable.  He denies hx of kidney disease/ renal faiulre, nsaids', Acei/ ARB use.    Patient has no c/o's , elderly male is good shape, no abd pain, n/v/d, no fever, no SOB/ cough/ CP.     Past Medical History  Past Medical History  Diagnosis Date  . Hypertension   . Foley catheter in place     Urinary Retension  . BPH (benign prostatic hyperplasia)   . Prostate cancer [facility])   urologist- dr Gregory [name]/  oncologist-  dr Gregory [name]    Gleason 4+5,  PSA 18.74,  w/  Visceral and Extensive Bones METS--  pallitive radiation therapy and hormone therapy  . Bone metastases ([place])   . GERD (gastroesophageal reflux disease)    Past Surgical History  Past Surgical History  Procedure Laterality Date  . Prostate biopsy  1/16 /2017  . Multiple tooth extractions  1980's  . Transurethral resection of prostate N/A 08/27/2015    Procedure: TRANSURETHRAL RESECTION OF THE PROSTATE WITH GYRUS INSTRUMENTS;  Surgeon: Gregory Gallo, MD;  [facility];  Service: Urology;  Laterality: N/A;  . Orchiectomy Bilateral 08/27/2015    Procedure: ORCHIECTOMY;  Surgeon: Gregory Gallo, MD;  Location: [facility];  Service: Urology;  Laterality: Bilateral;   Family History  Family History  Problem Relation Age of Onset   . Tuberculosis Mother 63  . Other Father 51   Social History  reports that he has quit smoking. His smoking use included Cigarettes. He started smoking about 23 years ago. He has a 7.5 pack-year smoking history. He has never used smokeless tobacco. He reports that he does not drink alcohol or use illicit drugs. Allergies No Known Allergies Home medications Prior to Admission medications   Medication Sig Start Date End Date Taking? Authorizing Provider  atenolol (TENORMIN) 100 MG tablet TAKE 1 TABLET BY MOUTH EVERY DAY Patient taking differently: TAKE 100 MG BY MOUTH EVERY DAY--- takes in am 08/10/15  Yes Gregory Dew, FNP  cephALEXin (KEFLEX) 500 MG capsule Take 1 capsule (500 mg total) by mouth 2 (two) times daily. 08/28/15  Yes Gregory Gallo, MD  hydrochlorothiazide (HYDRODIURIL) 25 MG tablet TAKE 1 TABLET BY MOUTH DAILY Patient taking differently: TAKE 1 TABLET BY MOUTH DAILY 10 am 05/11/15  Yes Gregory Dew, FNP  Multiple Vitamins-Minerals (MULTIVITAMIN WITH MINERALS) tablet Take 1 tablet by mouth every morning.    Yes Historical Provider, MD   Liver Function Tests  Recent Labs Lab 08/30/15 0607 08/30/15 1555 08/31/15 0802  AST 35  --   --   ALT 14*  --   --   ALKPHOS 215*  --   --   BILITOT 0.8  --   --   PROT 5.0*  --   --  ALBUMIN 2.9* 2.9* 2.9*   No results for input(s): LIPASE, AMYLASE in the last 168 hours. CBC  Recent Labs Lab 08/28/15 2023 08/29/15 0556 08/30/15 0607 08/31/15 0802  WBC 15.5*  --  12.3*  --   NEUTROABS 11.6*  --   --   --   HGB 11.3* 9.7* 9.4* 9.2*  HCT 33.1* 28.9* 27.8* 27.3*  MCV 88.3  --  89.7  --   PLT 166  --  121*  --    Basic Metabolic Panel  Recent Labs Lab 08/27/15 1246 08/28/15 2246 08/29/15 0556 08/30/15 0607 08/30/15 1555 08/31/15 0802  NA 141 128* 132* 140 138 145  K 4.4 4.2 5.3* 4.1 4.7 4.1  CL  --  98* 102 114* 114* 118*  CO2  --  22 19* 20* 16* 20*  GLUCOSE 126* 188* 149* 107* 145* 152*  BUN  --  36*  39* 43* 44* 41*  CREATININE  --  3.05* 3.59* 4.18* 4.86* 4.23*  CALCIUM  --  8.6* 8.7* 7.9* 7.5* 8.2*  PHOS  --   --   --   --  2.5 2.3*    Filed Vitals:   08/30/15 1342 08/30/15 1600 08/30/15 2100 08/31/15 0603  BP: 154/72  114/85 137/72  Pulse: 80  78 77  Temp: 98.2 F (36.8 C)  99 F (37.2 C) 98 F (36.7 C)  TempSrc: Oral  Oral Oral  Resp: 18  16 20   Height:      Weight:  97.433 kg (214 lb 12.8 oz)  94.62 kg (208 lb 9.6 oz)  SpO2: 100%  100% 100%   Exam Alert, responsive, lying flat, no distress, calm and Ox 3 No rash, cyanosis or gangrene Sclera anicteric, throat clear and moist No jvd, flat neck veins Chest clear bilat RRR no mrg Abd soft ntnd no mass or ascites +bs MS no joint effusions Ext no LE or UE edema GU foley in place, dark amber urine in large amounts but is also getting CBI Neuro alert nf  UA 07/18/15 > normal UA 08/28/15 > red, larg Hb, 100 prot, tntc rbc's, 1.006, 0-5 wbc/ epi  Date   Creat  eGFR Feb '16   1.04  64  Oct '16  1.28  Jul 14, 2015  1.17 Jul 18, 2015  1.00 Aug 28, 2015  3.05  17 Aug 30, 2015  4.18 Aug 31, 2015   4.23  12  WBC 12k, Hb 9.4  plt 121 Na 145 K 4.1  Cl 118  CO2 20  BUN 41 Creat 4.23  Ca 8.2  Alb 2.9  P 2.3  Tbili 0.8  AST 35 Renal US 2/18 > 11-12 cm kidneys, no hydro, normal echotexture Feb 1st outpt CT abd >  diffuse osseous mets, risk of hip fracture, normal kidneys/ liver/ etc.  Assessment: 1 Acute on CKD3 (baseline creat 1.1) - suspect ATN from contrast injury in setting of underlying HTN'sive nephrosclerosis. Had IV contrast as OP on Feb 1st with abd CT which showed diffuse osseous mets w risk of hip fracture, and normal kidneys. Also probable bladder outlet obstruction w CT on 2/1 showing thickened bladder wall; however no hydro noted on that CT.  Creat may be leveling off now. No uremic signs, no need for HD. Has room for volume, will increase IVF"s and change to hypotonic bicarb fluids. 2 Hematuria - on CBI , per urol 3  HTN 40-50 yrs taking BP medication. Home BP meds (atenolol/ hctz) are on  hold. BP's ok 4 Prost cancer - s/p TURP/ cysto/ bilat orchiect on 2/16   Plan - change to IV bicarb drip, increase rate, lower Na in fluids. Will follow.   Kelly Splinter MD [employer] pager [phone]    cell [phone] 08/31/2015, 9:57 AM

## 2015-09-01 ENCOUNTER — Inpatient Hospital Stay (HOSPITAL_COMMUNITY): Payer: Medicare Other

## 2015-09-01 DIAGNOSIS — R778 Other specified abnormalities of plasma proteins: Secondary | ICD-10-CM | POA: Diagnosis present

## 2015-09-01 DIAGNOSIS — R7989 Other specified abnormal findings of blood chemistry: Secondary | ICD-10-CM

## 2015-09-01 DIAGNOSIS — I5033 Acute on chronic diastolic (congestive) heart failure: Secondary | ICD-10-CM | POA: Diagnosis present

## 2015-09-01 LAB — CBC WITH DIFFERENTIAL/PLATELET
BASOS PCT: 0 %
Basophils Absolute: 0 10*3/uL (ref 0.0–0.1)
Eosinophils Absolute: 0.1 10*3/uL (ref 0.0–0.7)
Eosinophils Relative: 1 %
HEMATOCRIT: 31.8 % — AB (ref 39.0–52.0)
HEMOGLOBIN: 10.3 g/dL — AB (ref 13.0–17.0)
LYMPHS ABS: 1 10*3/uL (ref 0.7–4.0)
LYMPHS PCT: 9 %
MCH: 30.3 pg (ref 26.0–34.0)
MCHC: 32.4 g/dL (ref 30.0–36.0)
MCV: 93.5 fL (ref 78.0–100.0)
MONO ABS: 0.9 10*3/uL (ref 0.1–1.0)
MONOS PCT: 7 %
NEUTROS ABS: 9.8 10*3/uL — AB (ref 1.7–7.7)
NEUTROS PCT: 83 %
Platelets: 147 10*3/uL — ABNORMAL LOW (ref 150–400)
RBC: 3.4 MIL/uL — ABNORMAL LOW (ref 4.22–5.81)
RDW: 15.9 % — ABNORMAL HIGH (ref 11.5–15.5)
WBC: 11.7 10*3/uL — ABNORMAL HIGH (ref 4.0–10.5)

## 2015-09-01 LAB — BASIC METABOLIC PANEL
ANION GAP: 9 (ref 5–15)
BUN: 37 mg/dL — AB (ref 6–20)
CHLORIDE: 116 mmol/L — AB (ref 101–111)
CO2: 20 mmol/L — AB (ref 22–32)
Calcium: 7.8 mg/dL — ABNORMAL LOW (ref 8.9–10.3)
Creatinine, Ser: 4.77 mg/dL — ABNORMAL HIGH (ref 0.61–1.24)
GFR calc Af Amer: 12 mL/min — ABNORMAL LOW (ref >60)
GFR, EST NON AFRICAN AMERICAN: 10 mL/min — AB (ref >60)
GLUCOSE: 150 mg/dL — AB (ref 65–99)
POTASSIUM: 4.2 mmol/L (ref 3.5–5.1)
Sodium: 145 mmol/L (ref 135–145)

## 2015-09-01 LAB — TROPONIN I
TROPONIN I: 0.26 ng/mL — AB (ref <0.031)
TROPONIN I: 0.44 ng/mL — AB (ref <0.031)
Troponin I: 0.16 ng/mL — ABNORMAL HIGH (ref <0.031)

## 2015-09-01 LAB — BRAIN NATRIURETIC PEPTIDE: B Natriuretic Peptide: 1213.2 pg/mL — ABNORMAL HIGH (ref 0.0–100.0)

## 2015-09-01 MED ORDER — FUROSEMIDE 10 MG/ML IJ SOLN
120.0000 mg | Freq: Two times a day (BID) | INTRAMUSCULAR | Status: DC
  Administered 2015-09-01 – 2015-09-02 (×2): 120 mg via INTRAVENOUS
  Filled 2015-09-01: qty 10
  Filled 2015-09-01: qty 12
  Filled 2015-09-01: qty 10
  Filled 2015-09-01: qty 12

## 2015-09-01 MED ORDER — ALBUTEROL SULFATE (2.5 MG/3ML) 0.083% IN NEBU
3.0000 mL | INHALATION_SOLUTION | Freq: Three times a day (TID) | RESPIRATORY_TRACT | Status: DC
  Administered 2015-09-01 – 2015-09-03 (×6): 3 mL via RESPIRATORY_TRACT
  Filled 2015-09-01 (×6): qty 3

## 2015-09-01 MED ORDER — ALBUTEROL SULFATE (2.5 MG/3ML) 0.083% IN NEBU
3.0000 mL | INHALATION_SOLUTION | RESPIRATORY_TRACT | Status: DC
Start: 2015-09-01 — End: 2015-09-01
  Administered 2015-09-01: 3 mL via RESPIRATORY_TRACT
  Filled 2015-09-01: qty 3

## 2015-09-01 NOTE — Progress Notes (Signed)
I have spoken with the pt this am--no pain from bladder, but he c/o SOB. I will leave eval/mgmt of this to hospitalists. I will be delayed seeing pt until this am as I am at another site until this pm. If any questions, please contact me.  Franchot Gallo [phone]

## 2015-09-01 NOTE — Progress Notes (Signed)
[facility] Progress Note   Subjective: short of breath. CXR read as normal but looks early wet to me compared to old film  Filed Vitals:   09/01/15 0554 09/01/15 0600 09/01/15 1153 09/01/15 1158  BP:  151/84    Pulse:  90    Temp:      TempSrc:      Resp: 18     Height:      Weight:      SpO2:  100% 98% 98%    Inpatient medications: . albuterol  3 mL Inhalation TID  . furosemide  120 mg Intravenous Q12H  . lidocaine  1 application Urethral Once  . polyethylene glycol  17 g Oral Daily     acetaminophen, HYDROcodone-acetaminophen, ondansetron (ZOFRAN) IV  Exam: Alert, responsive, lying flat, no distress, calm and Ox 3 No jvd, flat neck veins Chest clear bilat RRR no mrg Abd soft ntnd no mass or ascites +bs MS no joint effusions Ext no sig LE or UE edema GU foley in place, dark amber urine in large amounts, no blood Neuro alert nf  UA 07/18/15 > normal UA 08/28/15 > red, larg Hb, 100 prot, tntc rbc's, 1.006, 0-5 wbc/ epi  DateCreateGFR Feb '16 1.0464 Oct '161.28 Jan 3, 20171.17 Jan 7, 20171.00 Feb 17, 41324.4010 Feb 19, 20174.18 Aug 31, 2015 4.2312  WBC 12k, Hb 9.4 plt 121 Na 145 K 4.1 Cl 118 CO2 20 BUN 41 Creat 4.23 Ca 8.2 Alb 2.9 P 2.3 Tbili 0.8 AST 35 Renal US 2/18 > 11-12 cm kidneys, no hydro, normal echotexture Feb 1st outpt CT abd > diffuse osseous mets, risk of hip fracture, normal kidneys/ liver/ etc.  Assessment: 1 Acute on CKD3 (baseline creat 1.1) -  SOB and prob early CHF. Fluids dc'd, will give IV lasix.  Not uremic yet, but may need HD soon.  Not sure cause of AKI.  Renal US on 2/18 negative for cause. Has underlying moderate CKD baseline creat 1.1.  Last good creat on Jan 7th was 1.0; had contrast on Feb 1st w  Abd CT with bone scan done the same day. He saw radiation ONC on Feb 7 and Feb 9 w/o labs. Then next creatinine here is from 2/17 postop after TURP/orchiect and was 3.0.  Contrast renal injury prob would have presented earlier or resolved having been given 16 days prior to surgery.  For now keep here, give IV lasix for vol overload and consider transfer to [facility] for HD if doesn't improve soon.  2 Hematuria - urine is clear, ok to dc CBI per urol, orders written to dc 3 HTN 40-50 yrs taking BP medication. Home BP meds (atenolol/ hctz) are on hold. BP's ok 4 Prost cancer - s/p TURP/ cysto/ bilat orchiect on 2/16   Plan - dc CBI, stopped IVF"s, IV lasix, creat in am.    Kelly Splinter MD [facility] pager [phone]    cell [phone] 09/01/2015, 2:50 PM    Recent Labs Lab 08/30/15 1555 08/31/15 0802 09/01/15 0545  NA 138 145 145  K 4.7 4.1 4.2  CL 114* 118* 116*  CO2 16* 20* 20*  GLUCOSE 145* 152* 150*  BUN 44* 41* 37*  CREATININE 4.86* 4.23* 4.77*  CALCIUM 7.5* 8.2* 7.8*  PHOS 2.5 2.3*  --     Recent Labs Lab 08/30/15 0607 08/30/15 1555 08/31/15 0802  AST 35  --   --   ALT 14*  --   --   ALKPHOS 215*  --   --  BILITOT 0.8  --   --   PROT 5.0*  --   --   ALBUMIN 2.9* 2.9* 2.9*    Recent Labs Lab 08/28/15 2023  08/30/15 0607 08/31/15 0802 09/01/15 1036  WBC 15.5*  --  12.3*  --  11.7*  NEUTROABS 11.6*  --   --   --  9.8*  HGB 11.3*  < > 9.4* 9.2* 10.3*  HCT 33.1*  < > 27.8* 27.3* 31.8*  MCV 88.3  --  89.7  --  93.5  PLT 166  --  121*  --  147*  < > = values in this interval not displayed.

## 2015-09-01 NOTE — Care Management Important Message (Signed)
Important Message  Patient Details  Name: Gregory [name] MRN: [id number] Date of Birth: Jan 05, 1929   Medicare Important Message Given:  Yes    Camillo Flaming 09/01/2015, 1:09 [place] Message  Patient Details  Name: Gregory [name] MRN: [id number] Date of Birth: 18-Oct-1928   Medicare Important Message Given:  Yes    Camillo Flaming 09/01/2015, 1:09 PM

## 2015-09-01 NOTE — Progress Notes (Signed)
RN received orders to hand irrigate the foley to remove possible blood clots.

## 2015-09-01 NOTE — Progress Notes (Signed)
Phoned patient's daughter, Jeanne Ivan, to inform her of her father's appointment with Dr. Tammi Klippel on 09/24/15 to further discuss treatment options. She reports her father has been readmitted and request the radiation staff attempt again to simulate him while he is hospitalized. She explains she is attempting to work while caring for her father alone. Informed Dr. Tammi Klippel of these findings. Paulette understands this RN will call her back with an update once she receives direction from Dr. Tammi Klippel.

## 2015-09-01 NOTE — Consult Note (Signed)
CSW had a SW consult on 2/20 reporting pt "doesn't want to return home."  PT reported to [facility] that pt declined working with PT this morning.   BSW Intern met with pt at bedside. Pt reported his plan is to return after hospitalization. BSW Intern inquired about short-term rehab but pt declined.  No further SW needs identified at this time.  BSW Intern signing off, Harlon Flor, [place] Work Department  [phone]

## 2015-09-01 NOTE — Progress Notes (Signed)
PT Cancellation Note  Patient Details Name: Gregory [name] MRN: [id number] DOB: 09-21-1928   Cancelled Treatment:    Reason Eval/Treat Not Completed: Fatigue/lethargy limiting ability to participate Pt reports feeling SOB upon arrival in room.  SpO2 99% with pt on O2 [name].  Pt declines mobility and PT at this time stating too fatigued today to participate.   Jamarkus Lisbon,KATHrine E 09/01/2015, 11:04 AM Carmelia Bake, PT, DPT 09/01/2015 Pager: [phone]

## 2015-09-01 NOTE — Progress Notes (Signed)
  Subjective: Patient reports that he has continued shortness of breath. No chest pain. No scrotal/bladder pain.  Objective: Vital signs in last 24 hours: Temp:  [98 F (36.7 C)-99.4 F (37.4 C)] 98 F (36.7 C) (02/21 1500) Pulse Rate:  [86-106] 104 (02/21 1500) Resp:  [16-22] 18 (02/21 1500) BP: (143-174)/(65-111) 162/74 mmHg (02/21 1500) SpO2:  [98 %-100 %] 100 % (02/21 1500) Weight:  [99.428 kg (219 lb 3.2 oz)] 99.428 kg (219 lb 3.2 oz) (02/21 0526)  Intake/Output from previous day: 02/20 0701 - 02/21 0700 In: 11872.5 [P.O.:400; I.V.:2872.5] Out: 6800 [Urine:6800] Intake/Output this shift: Total I/O In: 6360 [P.O.:360; Other:6000] Out: 7500 [Urine:7500]  Physical Exam:  Constitutional: Vital signs reviewed. WD WN in NAD   Eyes: PERRL, No scleral icterus.   Cardiovascular: RRR Pulmonary/Chest: Somewhat tachypneic. Genitourinary: Scrotum slightly edematous, but wound healing well. No evidence of infection. Mild paraphimosis, reduced   Lab Results:  Recent Labs  08/30/15 0607 08/31/15 0802 09/01/15 1036  HGB 9.4* 9.2* 10.3*  HCT 27.8* 27.3* 31.8*   BMET  Recent Labs  08/31/15 0802 09/01/15 0545  NA 145 145  K 4.1 4.2  CL 118* 116*  CO2 20* 20*  GLUCOSE 152* 150*  BUN 41* 37*  CREATININE 4.23* 4.77*  CALCIUM 8.2* 7.8*   No results for input(s): LABPT, INR in the last 72 hours. No results for input(s): LABURIN in the last 72 hours. No results found for this or any previous visit.  Studies/Results: Dg Chest Port 1 View  09/01/2015  CLINICAL DATA:  82 year old male with shortness of Breath. Initial encounter. Prostate cancer with metastatic disease to bone. EXAM: PORTABLE CHEST 1 VIEW COMPARISON:  Chest radiographs 04/01/2015. CT Abdomen and Pelvis 08/12/2015. FINDINGS: Portable AP semi upright view at 1003 hours. Stable elevation of the right hemidiaphragm with associated right lung base atelectasis. Mediastinal contours are stable and within normal limits.  Visualized tracheal air column is within normal limits. Scattered ribs sclerosis, better demonstrated by CT. Sclerotic vertebral body metastasis are poorly visible radiographically. No pneumothorax, pulmonary edema, pleural effusion or acute pulmonary opacity. IMPRESSION: 1. No acute cardiopulmonary abnormality. 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 2. Widespread osseous metastatic disease, better demonstrated on recent CT Abdomen and Pelvis. Electronically Signed   By: Genevie Ann M.D.   On: 09/01/2015 10:17    Assessment/Plan:   Postoperative day 5 TURP/bilateral orchiectomy for obstructive prostate cancer with metastasis. He is readmitted with recurrent hematuria which has now cleared    He has congestive heart failure, with acute kidney injury. This is being managed/followed by nephrology. Patient's creatinine was 4.77 today, an increase from yesterday.    I am fine with transfer at your leisure. He is doing well without the continuous bladder irrigation-I would leave his Foley catheter in now which would help follow his output.   LOS: 3 days   Franchot Gallo M 09/01/2015, 6:29 PM

## 2015-09-01 NOTE — Progress Notes (Signed)
Patient woke up from sleep and started wheezing and c/o shortness of breath. Oxygen at 2 l/min [place] was placed on patient. Vitals were: 98.88F; HR106; RR22; 174/111;100% 2 L/Min .  Patient is anxious. [facility] was elevated to 90 degrees. The answering service for the MD on call was notified. Awaiting a call back from the MD.

## 2015-09-01 NOTE — Progress Notes (Signed)
[facility] Progress Note  Patient: Gregory [name] [id number]   PCP: Elizabeth Palau, MD DOB: [birth year]   DOA: 08/28/2015   DOS: 09/01/2015   Date of Service: the patient was seen and examined on 09/01/2015 Primary service: Franchot Gallo, MD   Subjective: Early in the morning the patient had an episode of shortness of breath as well as tachycardia. Denies any chest pain chest tightness. No nausea no vomiting. No diaphoresis. Nutrition: Tolerating oral diet Activity: Mostly bedridden Last BM: 08/30/2015  Assessment and Plan: 1. S/P TURP Patient recently diagnosed with poorly differentiated adenocarcinoma of the prostate, status post TURP. Developed gross hematuria. Currently receiving CBI. Management per urology. CBI has been discontinued at present.  2. Acute kidney injury with hyperkalemia. Most like secondary to obstruction from hematuria. Cannot get further workup due to patient's CBI status. Given worsening of the renal function nephrology has been consulted to assist in the management. Appreciate input from Dr. Jonnie Finner.  Avoid nephrotoxic medications. Hyperkalemia has resolved.  3. Hyperglycemia. The sugars remained stable at present. Currently would hold off on sliding scale insulin as well as CBG. Check glucose daily morning.  4. Hematuria with acute blood loss anemia. Patient received one unit of PRBC. currently H&H remained stable recommend to monitor  Continue monitoring H&H.  5. Essential hypertension. Blood pressurstable without medication Holding blood pressure medications.  6. Acute on chronic diastolic dysfunction. Patient had an episode of tachycardia this morning with shortness of breath. Requiring 2 L O2 1 L of oxygen. Chest x-ray is clear. Examination shows basal crackles. EKG shows sinus tachycardia. Troponins are trending up but more likely secondary to demand ischemia from CHF. Patient has been receiving IV Lasix at present. Of monitor  ins and outs and daily weight. Follow up on echocardiogram. Currently holding antiplatelets as well as anticoagulation since the patient presented with hematuria life-threatening and does not have any chest pain or EKG changes with minimal troponin elevation.   DVT Prophylaxis: mechanical compression device Nutrition: regular diet Advance goals of care discussion: full code  Brief Summary of Hospitalization:  HPI: As per the note dictated on day of consult, "82 year old male with a history of hypertension, BPH, metastatic prostate cancer with urinary retention, indwelling Foley catheter for the last 4-5 weeks with plans for TURP on 2/16, on finasteride, Casodex, Flomax, who is being seen today for worsening renal function. Patient recently diagnosed with metastatic adenocarcinoma, seen by urology and 07/28/15. Patient had CT abdomen pelvis with contrast on 08/12/15 at the urology office. During that time his creatinine was around 1.0. Post TURP on 2/16. Patient's creatinine was 3.05> now 3.59. Patient has gross hematuria and is on continuous bladder irrigation. Unable to strictly calculate I's and O's. Hemoglobin dropped from 15.0-9.7 today. Urine appears to be bloody but negative for UTI. Patient is followed by Dorena Dew, FNP who is his primary care. Patient currently denies any pain from his bony metastasis." Daily update, Procedures: none Antibiotics: Anti-infectives    None      Family Communication: no family was present at bedside, at the time of interview.   Disposition:  Barriers to safe discharge: Stabilization of the renal function as well as actual monitoring of the urine output once CBI is  on hold   Intake/Output Summary (Last 24 hours) at 09/01/15 1957 Last data filed at 09/01/15 1700  Gross per 24 hour  Intake  14545 ml  Output  13550 ml  Net    995 ml   Filed  Weights   08/30/15 1600 08/31/15 0603 09/01/15 0526  Weight: 97.433 kg (214 lb 12.8 oz) 94.62 kg (208 lb 9.6  oz) 99.428 kg (219 lb 3.2 oz)    Objective: Physical Exam: Filed Vitals:   09/01/15 0600 09/01/15 1153 09/01/15 1158 09/01/15 1500  BP: 151/84   162/74  Pulse: 90   104  Temp:    98 F (36.7 C)  TempSrc:    Oral  Resp:    18  Height:      Weight:      SpO2: 100% 98% 98% 100%    General: Appear in mild distress, no Rash; Oral Mucosa moist. Cardiovascular: S1 and S2 Present, no Murmur, no JVD Respiratory: Bilateral Air entry present and Clear to Auscultation, no Crackles, no wheezes Abdomen: Bowel Sound present, Soft and no tenderness Extremities: no Pedal edema, no calf tenderness  Data Reviewed: CBC:  Recent Labs Lab 08/28/15 2023 08/29/15 0556 08/30/15 0607 08/31/15 0802 09/01/15 1036  WBC 15.5*  --  12.3*  --  11.7*  NEUTROABS 11.6*  --   --   --  9.8*  HGB 11.3* 9.7* 9.4* 9.2* 10.3*  HCT 33.1* 28.9* 27.8* 27.3* 31.8*  MCV 88.3  --  89.7  --  93.5  PLT 166  --  121*  --  [id number]*   Basic Metabolic Panel:  Recent Labs Lab 08/29/15 0556 08/30/15 0607 08/30/15 1555 08/31/15 0802 09/01/15 0545  NA 132* 140 138 145 145  K 5.3* 4.1 4.7 4.1 4.2  CL 102 114* 114* 118* 116*  CO2 19* 20* 16* 20* 20*  GLUCOSE 149* 107* 145* 152* 150*  BUN 39* 43* 44* 41* 37*  CREATININE 3.59* 4.18* 4.86* 4.23* 4.77*  CALCIUM 8.7* 7.9* 7.5* 8.2* 7.8*  PHOS  --   --  2.5 2.3*  --    Liver Function Tests:  Recent Labs Lab 08/30/15 0607 08/30/15 1555 08/31/15 0802  AST 35  --   --   ALT 14*  --   --   ALKPHOS 215*  --   --   BILITOT 0.8  --   --   PROT 5.0*  --   --   ALBUMIN 2.9* 2.9* 2.9*   No results for input(s): LIPASE, AMYLASE in the last 168 hours. No results for input(s): AMMONIA in the last 168 hours.  Cardiac Enzymes:  Recent Labs Lab 09/01/15 1036 09/01/15 1601  TROPONINI 0.16* 0.26*   BNP (last 3 results)  Recent Labs  09/01/15 1036  BNP 1213.2*    CBG:  Recent Labs Lab 08/29/15 1117 08/29/15 1605 08/29/15 2127 08/30/15 0802  GLUCAP 118*  122* 111* 118*    No results found for this or any previous visit (from the past 240 hour(s)).   Studies: Dg Chest Port 1 View  09/01/2015  CLINICAL DATA:  82 year old male with shortness of Breath. Initial encounter. Prostate cancer with metastatic disease to bone. EXAM: PORTABLE CHEST 1 VIEW COMPARISON:  Chest radiographs 04/01/2015. CT Abdomen and Pelvis 08/12/2015. FINDINGS: Portable AP semi upright view at 1003 hours. Stable elevation of the right hemidiaphragm with associated right lung base atelectasis. Mediastinal contours are stable and within normal limits. Visualized tracheal air column is within normal limits. Scattered ribs sclerosis, better demonstrated by CT. Sclerotic vertebral body metastasis are poorly visible radiographically. No pneumothorax, pulmonary edema, pleural effusion or acute pulmonary opacity. IMPRESSION: 1. No acute cardiopulmonary abnormality. 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 2. Widespread osseous metastatic disease, better demonstrated on recent  CT Abdomen and Pelvis. Electronically Signed   By: Genevie Ann M.D.   On: 09/01/2015 10:17     Scheduled Meds: . albuterol  3 mL Inhalation TID  . furosemide  120 mg Intravenous BID  . lidocaine  1 application Urethral Once  . polyethylene glycol  17 g Oral Daily   Continuous Infusions:   PRN Meds: acetaminophen, HYDROcodone-acetaminophen, ondansetron (ZOFRAN) IV  Time spent: 30 minutes  Author: Berle Mull, MD [facility] Pager: [phone] 09/01/2015 7:57 PM  If 7PM-7AM, please contact night-coverage at [email or web address], password [facility]

## 2015-09-02 ENCOUNTER — Inpatient Hospital Stay (HOSPITAL_COMMUNITY): Payer: Medicare Other

## 2015-09-02 DIAGNOSIS — Z789 Other specified health status: Secondary | ICD-10-CM | POA: Insufficient documentation

## 2015-09-02 DIAGNOSIS — Z9079 Acquired absence of other genital organ(s): Secondary | ICD-10-CM

## 2015-09-02 DIAGNOSIS — Z87898 Personal history of other specified conditions: Secondary | ICD-10-CM

## 2015-09-02 DIAGNOSIS — R0600 Dyspnea, unspecified: Secondary | ICD-10-CM

## 2015-09-02 DIAGNOSIS — I10 Essential (primary) hypertension: Secondary | ICD-10-CM

## 2015-09-02 DIAGNOSIS — R7989 Other specified abnormal findings of blood chemistry: Secondary | ICD-10-CM

## 2015-09-02 DIAGNOSIS — N179 Acute kidney failure, unspecified: Secondary | ICD-10-CM

## 2015-09-02 LAB — URINALYSIS, ROUTINE W REFLEX MICROSCOPIC
BILIRUBIN URINE: NEGATIVE
Glucose, UA: NEGATIVE mg/dL
KETONES UR: NEGATIVE mg/dL
NITRITE: NEGATIVE
Protein, ur: 30 mg/dL — AB
Specific Gravity, Urine: 1.01 (ref 1.005–1.030)
pH: 6.5 (ref 5.0–8.0)

## 2015-09-02 LAB — BASIC METABOLIC PANEL
ANION GAP: 14 (ref 5–15)
BUN: 30 mg/dL — AB (ref 6–20)
CHLORIDE: 110 mmol/L (ref 101–111)
CO2: 23 mmol/L (ref 22–32)
Calcium: 8 mg/dL — ABNORMAL LOW (ref 8.9–10.3)
Creatinine, Ser: 3.22 mg/dL — ABNORMAL HIGH (ref 0.61–1.24)
GFR calc Af Amer: 19 mL/min — ABNORMAL LOW (ref >60)
GFR, EST NON AFRICAN AMERICAN: 16 mL/min — AB (ref >60)
Glucose, Bld: 121 mg/dL — ABNORMAL HIGH (ref 65–99)
POTASSIUM: 4.1 mmol/L (ref 3.5–5.1)
SODIUM: 147 mmol/L — AB (ref 135–145)

## 2015-09-02 LAB — URINE MICROSCOPIC-ADD ON

## 2015-09-02 LAB — TROPONIN I: TROPONIN I: 0.22 ng/mL — AB (ref <0.031)

## 2015-09-02 MED ORDER — SODIUM CHLORIDE 0.9% FLUSH
10.0000 mL | INTRAVENOUS | Status: DC | PRN
  Administered 2015-09-03 (×3): 10 mL
  Administered 2015-09-04: 30 mL
  Administered 2015-09-05: 40 mL
  Filled 2015-09-02 (×5): qty 40

## 2015-09-02 MED ORDER — FUROSEMIDE 10 MG/ML IJ SOLN: 60.0000 mg | Freq: Two times a day (BID) | INTRAMUSCULAR | Status: DC

## 2015-09-02 MED ORDER — SODIUM CHLORIDE 0.9% FLUSH
10.0000 mL | Freq: Two times a day (BID) | INTRAVENOUS | Status: DC
  Administered 2015-09-03 – 2015-09-04 (×3): 10 mL

## 2015-09-02 NOTE — Progress Notes (Addendum)
[facility] Progress Note   Subjective: creat better at 3.4.  UOP after 2pm 700 cc and some unrecorded. Breathing better. Down 8kg overnight by wts. Did get IV lasix yesterday.   Filed Vitals:   09/01/15 1500 09/01/15 2258 09/02/15 0258 09/02/15 0558  BP: 162/74 139/63  137/79  Pulse: 104 107  96  Temp: 98 F (36.7 C) 102.2 F (39 C) 98.4 F (36.9 C) 98.7 F (37.1 C)  TempSrc: Oral Oral Oral Oral  Resp: _0 Height:      Weight:    91.808 kg (202 lb 6.4 oz)  SpO2: 100% 99%  100%    Inpatient medications: . albuterol  3 mL Inhalation TID  . furosemide  120 mg Intravenous BID  . lidocaine  1 application Urethral Once  . polyethylene glycol  17 g Oral Daily     acetaminophen, HYDROcodone-acetaminophen, ondansetron (ZOFRAN) IV  Exam: Alert, responsive, lying flat, no distress, calm and Ox 3 No jvd, flat neck veins Chest clear bilat RRR no mrg Abd soft ntnd no mass or ascites +bs MS no joint effusions Ext no sig LE or UE edema GU foley in place, dark amber urine in large amounts, no blood Neuro alert nf  UA 07/18/15 > normal UA 08/28/15 > red, larg Hb, 100 prot, tntc rbc's, 1.006, 0-5 wbc/ epi  DateCreateGFR Feb '16 1.0464 Oct '161.28 Jan 3, 20171.17 Jan 7, 20171.00 Feb 17, 43568.6168 Feb 19, 20174.18 Aug 31, 2015 4.2312  Renal US 2/18 > 11-12 cm kidneys, no hydro, normal echotexture Feb 1st outpt CT abd > diffuse osseous mets, risk of hip fracture, normal kidneys/ liver/ etc.  Assessment: 1 Acute on CKD3 (baseline creat 1.1) -  unclear cause, possible contrast renal injury. Improving.  Has hematuria post-op. Doubt GN. 2 Hematuria - improved, per urol 3 HTN 40-50 yr hx. Home BP meds (atenolol/ hctz) are on hold. BP's ok 4  Prost cancer - s/p TURP/ cysto/ bilat orchiect on 2/16 5 SOB/volume  - better after IV lasix yest. Per CCM had very low venous pressures on central line placement this afternoon.    Plan - stop lasix for now. F/U creat in am.  Improving.    Kelly Splinter MD [facility] pager [phone]    cell [phone] 09/02/2015, 8:52 AM    Recent Labs Lab 08/30/15 1555 08/31/15 0802 09/01/15 0545 09/02/15 0543  NA 138 145 145 147*  K 4.7 4.1 4.2 4.1  CL 114* 118* 116* 110  CO2 16* 20* 20* 23  GLUCOSE 145* 152* 150* 121*  BUN 44* 41* 37* 30*  CREATININE 4.86* 4.23* 4.77* 3.22*  CALCIUM 7.5* 8.2* 7.8* 8.0*  PHOS 2.5 2.3*  --   --     Recent Labs Lab 08/30/15 0607 08/30/15 1555 08/31/15 0802  AST 35  --   --   ALT 14*  --   --   ALKPHOS 215*  --   --   BILITOT 0.8  --   --   PROT 5.0*  --   --   ALBUMIN 2.9* 2.9* 2.9*    Recent Labs Lab 08/28/15 2023  08/30/15 0607 08/31/15 0802 09/01/15 1036  WBC 15.5*  --  12.3*  --  11.7*  NEUTROABS 11.6*  --   --   --  9.8*  HGB 11.3*  < > 9.4* 9.2* 10.3*  HCT 33.1*  < > 27.8* 27.3* 31.8*  MCV 88.3  --  89.7  --  93.5  PLT 166  --  121*  --  147*  < > = values in this interval not displayed.

## 2015-09-02 NOTE — Progress Notes (Signed)
[facility] Progress Note  Patient: Gregory [name] [id number]   PCP: Elizabeth Palau, MD DOB: May 16, 1929   DOA: 08/28/2015   DOS: 09/02/2015   Date of Service: the patient was seen and examined on 09/02/2015 Primary service: Franchot Gallo, MD   Brief Summary of Hospitalization:  As per the note dictated on day of consult, "82 year old male with a history of hypertension, BPH, metastatic prostate cancer with urinary retention, indwelling Foley catheter for the last 4-5 weeks with plans for TURP on 2/16, on finasteride, Casodex, Flomax, who is being seen today for worsening renal function. Patient recently diagnosed with metastatic adenocarcinoma, seen by urology and 07/28/15. Patient had CT abdomen pelvis with contrast on 08/12/15 at the urology office. During that time his creatinine was around 1.0. Post TURP on 2/16. Patient's creatinine was 3.05> now 3.59. Patient has gross hematuria and is on continuous bladder irrigation. Unable to strictly calculate I's and O's. Hemoglobin dropped from 15.0-9.7 today. Urine appears to be bloody but negative for UTI. Patient is followed by Dorena Dew, FNP who is his primary care. Patient currently denies any pain from his bony metastasis."  Subjective:  No overnight events Breathing better Lost IV access  Assessment and Plan: 1. S/P TURP Patient recently diagnosed with poorly differentiated adenocarcinoma of the prostate, status post TURP. Developed gross hematuria.  Management per urology  2. Acute kidney injury with hyperkalemia. Most like secondary to obstruction from hematuria. Given worsening of the renal function nephrology has been consulted to assist in the management. Appreciate input from Dr. Jonnie Finner. IV lasix Avoid nephrotoxic medications. Hyperkalemia has resolved.  3. Hyperglycemia. stable at present  4. Hematuria with acute blood loss anemia. Patient received one unit of PRBC. currently H&H remained stable  recommend to monitor  Continue monitoring H&H.  5. Essential hypertension. Blood pressure stable without medication Holding blood pressure medications.  6. Elevated tropinin -recheck x1- ? Demand ischemia -currently CP free -echo:Right ventricle: Systolic function was moderately reduced with apical akinesis  Needs central line for IV access Out of bed to chair Ambulate patient    DVT Prophylaxis: mechanical compression device Nutrition: regular diet  Advance goals of care discussion: full code  Daily update, Procedures: none Antibiotics: Anti-infectives    None      Family Communication: no family was present at bedside   Intake/Output Summary (Last 24 hours) at 09/02/15 1351 Last data filed at 09/02/15 0900  Gross per 24 hour  Intake    382 ml  Output   5450 ml  Net  -5068 ml   Filed Weights   08/31/15 0603 09/01/15 0526 09/02/15 0558  Weight: 94.62 kg (208 lb 9.6 oz) 99.428 kg (219 lb 3.2 oz) 91.808 kg (202 lb 6.4 oz)    Objective: Physical Exam: Filed Vitals:   09/01/15 2258 09/02/15 0258 09/02/15 0558 09/02/15 0857  BP: 139/63  137/79   Pulse: 107  96   Temp: 102.2 F (39 C) 98.4 F (36.9 C) 98.7 F (37.1 C)   TempSrc: Oral Oral Oral   Resp: 18  18   Height:      Weight:   91.808 kg (202 lb 6.4 oz)   SpO2: 99%  100% 99%    General:  Younger than stated age, NAD Cardiovascular: S1 and S2 Present, no Murmur, no JVD Respiratory: Bilateral Air entry present and Clear to Auscultation, no Crackles, no wheezes Abdomen: Bowel Sound present, Soft and no tenderness   Data Reviewed: CBC:  Recent Labs Lab  08/28/15 2023 08/29/15 0556 08/30/15 0607 08/31/15 0802 09/01/15 1036  WBC 15.5*  --  12.3*  --  11.7*  NEUTROABS 11.6*  --   --   --  9.8*  HGB 11.3* 9.7* 9.4* 9.2* 10.3*  HCT 33.1* 28.9* 27.8* 27.3* 31.8*  MCV 88.3  --  89.7  --  93.5  PLT 166  --  121*  --  [id number]*   Basic Metabolic Panel:  Recent Labs Lab 08/30/15 0607 08/30/15 1555  08/31/15 0802 09/01/15 0545 09/02/15 0543  NA 140 138 145 145 147*  K 4.1 4.7 4.1 4.2 4.1  CL 114* 114* 118* 116* 110  CO2 20* 16* 20* 20* 23  GLUCOSE 107* 145* 152* 150* 121*  BUN 43* 44* 41* 37* 30*  CREATININE 4.18* 4.86* 4.23* 4.77* 3.22*  CALCIUM 7.9* 7.5* 8.2* 7.8* 8.0*  PHOS  --  2.5 2.3*  --   --    Liver Function Tests:  Recent Labs Lab 08/30/15 0607 08/30/15 1555 08/31/15 0802  AST 35  --   --   ALT 14*  --   --   ALKPHOS 215*  --   --   BILITOT 0.8  --   --   PROT 5.0*  --   --   ALBUMIN 2.9* 2.9* 2.9*   No results for input(s): LIPASE, AMYLASE in the last 168 hours. No results for input(s): AMMONIA in the last 168 hours.  Cardiac Enzymes:  Recent Labs Lab 09/01/15 1036 09/01/15 1601 09/01/15 2121  TROPONINI 0.16* 0.26* 0.44*   BNP (last 3 results)  Recent Labs  09/01/15 1036  BNP 1213.2*    CBG:  Recent Labs Lab 08/29/15 1117 08/29/15 1605 08/29/15 2127 08/30/15 0802  GLUCAP 118* 122* 111* 118*    No results found for this or any previous visit (from the past 240 hour(s)).   Studies: No results found.   Scheduled Meds: . albuterol  3 mL Inhalation TID  . furosemide  120 mg Intravenous BID  . lidocaine  1 application Urethral Once  . polyethylene glycol  17 g Oral Daily   Continuous Infusions:   PRN Meds: acetaminophen, HYDROcodone-acetaminophen, ondansetron (ZOFRAN) IV  Time spent: 35 minutes  Eulogio Bear DO [id number]  09/02/2015 1:51 PM  If 7PM-7AM, please contact night-coverage at [email or web address], password [facility]

## 2015-09-02 NOTE — Progress Notes (Signed)
  Subjective: Patient reports that he is breathing better.  He does not complain of pain  Objective: Vital signs in last 24 hours: Temp:  [98 F (36.7 C)-102.2 F (39 C)] 98.7 F (37.1 C) (02/22 0558) Pulse Rate:  [96-107] 96 (02/22 0558) Resp:  [18] 18 (02/22 0558) BP: (137-162)/(63-79) 137/79 mmHg (02/22 0558) SpO2:  [98 %-100 %] 100 % (02/22 0558) Weight:  [91.808 kg (202 lb 6.4 oz)] 91.808 kg (202 lb 6.4 oz) (02/22 0558)  Intake/Output from previous day: 02/21 0701 - 02/22 0700 In: 6572 [P.O.:480; IV Piggyback:62] Out: [id number] Intake/Output this shift:    Physical Exam:  Constitutional: Vital signs reviewed. WD WN in NAD   Eyes: PERRL, No scleral icterus.   Pulmonary/Chest: more normal effort this morning, less labored  Urine is clear, without clots, pinkish brown Lab Results:  Recent Labs  08/31/15 0802 09/01/15 1036  HGB 9.2* 10.3*  HCT 27.3* 31.8*   BMET  Recent Labs  09/01/15 0545 09/02/15 0543  NA 145 147*  K 4.2 4.1  CL 116* 110  CO2 20* 23  GLUCOSE 150* 121*  BUN 37* 30*  CREATININE 4.77* 3.22*  CALCIUM 7.8* 8.0*   No results for input(s): LABPT, INR in the last 72 hours. No results for input(s): LABURIN in the last 72 hours. No results found for this or any previous visit.  Studies/Results: Dg Chest Port 1 View  09/01/2015  CLINICAL DATA:  82 year old male with shortness of Breath. Initial encounter. Prostate cancer with metastatic disease to bone. EXAM: PORTABLE CHEST 1 VIEW COMPARISON:  Chest radiographs 04/01/2015. CT Abdomen and Pelvis 08/12/2015. FINDINGS: Portable AP semi upright view at 1003 hours. Stable elevation of the right hemidiaphragm with associated right lung base atelectasis. Mediastinal contours are stable and within normal limits. Visualized tracheal air column is within normal limits. Scattered ribs sclerosis, better demonstrated by CT. Sclerotic vertebral body metastasis are poorly visible radiographically. No  pneumothorax, pulmonary edema, pleural effusion or acute pulmonary opacity. IMPRESSION: 1. No acute cardiopulmonary abnormality. 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 2. Widespread osseous metastatic disease, better demonstrated on recent CT Abdomen and Pelvis. Electronically Signed   By: Genevie Ann M.D.   On: 09/01/2015 10:17    Assessment/Plan:   Status post TURP for obstructive prostate cancer with concurrent orchiectomy last week.  He developed renal failure, which seemingly, based on his creatinine today, has improved some.  He has mobilized a significant amount of edema and is breathing better  I would leave the catheter in one more day-this will assist his diuresis.  I will probably order to be removed tomorrow, for voiding trial before discharge.   LOS: 4 days   Franchot Gallo M 09/02/2015, 8:18 AM

## 2015-09-02 NOTE — Progress Notes (Signed)
  Echocardiogram 2D Echocardiogram has been performed.  Jennette Dubin 09/02/2015, 1:15 PM

## 2015-09-02 NOTE — Evaluation (Signed)
Physical Therapy Evaluation Patient Details Name: Gregory [name] MRN: [id number] DOB: 1929-06-12 Today's Date: 09/02/2015   History of Present Illness  82 year old male with a history of hypertension, BPH, metastatic prostate cancer with urinary retention, indwelling Foley catheter for the last 4-5 weeks with plans for TURP on 2/16 with worsening renal function post op. Patient recently diagnosed with metastatic adenocarcinoma  Clinical Impression  Pt admitted with above diagnosis. Pt currently with functional limitations due to the deficits listed below (see PT Problem List).  Pt will benefit from skilled PT to increase their independence and safety with mobility to allow discharge to the venue listed below.  Pt assisted with ambulating in hallway and required supplemental oxygen.  SATURATION QUALIFICATIONS: (This note is used to comply with regulatory documentation for home oxygen)  Patient Saturations on Room Air at Rest = 85%  Patient Saturations on Room Air while Ambulating = n/a  Patient Saturations on 3 Liters of oxygen while Ambulating = 98%  Please briefly explain why patient needs home oxygen: to keep SpO2 above 88% at rest     Follow Up Recommendations No PT follow up    Equipment Recommendations  None recommended by PT    Recommendations for Other Services       Precautions / Restrictions Precautions Precautions: Fall Precaution Comments: monitor sats      Mobility  Bed Mobility Overal bed mobility: Needs Assistance Bed Mobility: Supine to Sit     Supine to sit: Supervision        Transfers Overall transfer level: Needs assistance Equipment used: None Transfers: Sit to/from Stand Sit to Stand: Min guard         General transfer comment: verbal cues for safety  Ambulation/Gait Ambulation/Gait assistance: Min guard Ambulation Distance (Feet): 160 Feet   Gait Pattern/deviations: Step-through pattern;Decreased stride length     General Gait  Details: mobilizing well however required increase to oxygen up to 3L O2 [place] for SpO2 to improve to 98% (dropped to 78% on 2L O2), HR increased to 138 bpm  Stairs            Wheelchair Mobility    Modified Rankin (Stroke Patients Only)       Balance                                             Pertinent Vitals/Pain Pain Assessment: No/denies pain    Home Living Family/patient expects to be discharged to:: Private residence Living Arrangements: Other relatives Available Help at Discharge: Family   Home Access: Level entry     Home Layout: One level Home Equipment: None      Prior Function Level of Independence: Independent               Hand Dominance        Extremity/Trunk Assessment               Lower Extremity Assessment: Generalized weakness         Communication   Communication: No difficulties  Cognition Arousal/Alertness: Awake/alert Behavior During Therapy: WFL for tasks assessed/performed Overall Cognitive Status: Within Functional Limits for tasks assessed                      General Comments      Exercises        Assessment/Plan    PT  Assessment Patient needs continued PT services  PT Diagnosis Difficulty walking   PT Problem List Decreased activity tolerance;Decreased mobility;Cardiopulmonary status limiting activity  PT Treatment Interventions DME instruction;Gait training;Functional mobility training;Patient/family education;Therapeutic activities;Therapeutic exercise   PT Goals (Current goals can be found in the Care Plan section) Acute Rehab PT Goals PT Goal Formulation: With patient Time For Goal Achievement: 09/09/15 Potential to Achieve Goals: Good    Frequency Min 2X/week   Barriers to discharge        Co-evaluation               End of Session Equipment Utilized During Treatment: Oxygen Activity Tolerance: Patient tolerated treatment well Patient left: in chair;with  call bell/phone within reach;with chair alarm set;with family/visitor present Nurse Communication: Mobility status         Time: 1458-1510 PT Time Calculation (min) (ACUTE ONLY): 12 min   Charges:   PT Evaluation $PT Eval Low Complexity: 1 Procedure     PT G Codes:        Rayssa Atha,KATHrine E 09/02/2015, 4:35 PM Carmelia Bake, PT, DPT 09/02/2015 Pager: [phone]

## 2015-09-02 NOTE — Procedures (Signed)
Central Venous Catheter Insertion Procedure Note [name] [id number] [birth year]  Procedure: Insertion of Central Venous Catheter Indications: Drug and/or fluid administration and Frequent blood sampling  Procedure Details Consent: Risks of procedure as well as the alternatives and risks of each were explained to the (patient/caregiver).  Consent for procedure obtained. Time Out: Verified patient identification, verified procedure, site/side was marked, verified correct patient position, special equipment/implants available, medications/allergies/relevent history reviewed, required imaging and test results available.  Performed Real time [place] was used to Id and cannulate the vessel  Maximum sterile technique was used including antiseptics, cap, gloves, gown, hand hygiene, mask and sheet. Skin prep: Chlorhexidine; local anesthetic administered A antimicrobial bonded/coated triple lumen catheter was placed in the right internal jugular vein using the Seldinger technique.  Evaluation Blood flow good Complications: No apparent complications Patient did tolerate procedure well. Chest X-ray ordered to verify placement.  CXR: pending.  Gregory [name] 09/02/2015, 2:15 PM

## 2015-09-03 ENCOUNTER — Inpatient Hospital Stay (HOSPITAL_COMMUNITY): Payer: Medicare Other

## 2015-09-03 DIAGNOSIS — R319 Hematuria, unspecified: Secondary | ICD-10-CM

## 2015-09-03 DIAGNOSIS — I5033 Acute on chronic diastolic (congestive) heart failure: Secondary | ICD-10-CM

## 2015-09-03 DIAGNOSIS — R0902 Hypoxemia: Secondary | ICD-10-CM

## 2015-09-03 LAB — BASIC METABOLIC PANEL
ANION GAP: 12 (ref 5–15)
BUN: 19 mg/dL (ref 6–20)
CHLORIDE: 109 mmol/L (ref 101–111)
CO2: 27 mmol/L (ref 22–32)
Calcium: 7.7 mg/dL — ABNORMAL LOW (ref 8.9–10.3)
Creatinine, Ser: 1.99 mg/dL — ABNORMAL HIGH (ref 0.61–1.24)
GFR calc Af Amer: 34 mL/min — ABNORMAL LOW (ref >60)
GFR calc non Af Amer: 29 mL/min — ABNORMAL LOW (ref >60)
GLUCOSE: 129 mg/dL — AB (ref 65–99)
POTASSIUM: 3.8 mmol/L (ref 3.5–5.1)
Sodium: 148 mmol/L — ABNORMAL HIGH (ref 135–145)

## 2015-09-03 MED ORDER — ENOXAPARIN SODIUM 100 MG/ML ~~LOC~~ SOLN
1.0000 mg/kg | Freq: Once | SUBCUTANEOUS | Status: DC
  Filled 2015-09-03: qty 1

## 2015-09-03 MED ORDER — TECHNETIUM TO 99M ALBUMIN AGGREGATED
4.1000 | Freq: Once | INTRAVENOUS | Status: AC | PRN
  Administered 2015-09-03: 4 via INTRAVENOUS

## 2015-09-03 MED ORDER — TECHNETIUM TC 99M DIETHYLENETRIAME-PENTAACETIC ACID: 33.0000 | Freq: Once | INTRAVENOUS | Status: DC | PRN

## 2015-09-03 MED ORDER — ALBUTEROL SULFATE (2.5 MG/3ML) 0.083% IN NEBU: 3.0000 mL | INHALATION_SOLUTION | RESPIRATORY_TRACT | Status: DC | PRN

## 2015-09-03 MED ORDER — ENOXAPARIN SODIUM 100 MG/ML ~~LOC~~ SOLN: 1.0000 mg/kg | Freq: Two times a day (BID) | SUBCUTANEOUS | Status: DC

## 2015-09-03 NOTE — Progress Notes (Signed)
  [facility] Progress Note   Subjective: 2.6 L UOP yest. Creat down 1.99 today  Filed Vitals:   09/02/15 1426 09/02/15 2123 09/02/15 2214 09/03/15 0532  BP: 130/56  148/70 126/73  Pulse: 105  107 85  Temp:   99.3 F (37.4 C) 98.1 F (36.7 C)  TempSrc: Oral  Oral Oral  Resp: _0 Height:      Weight:    89.948 kg (198 lb 4.8 oz)  SpO2: 100% 98% 100% 100%    Inpatient medications: . albuterol  3 mL Inhalation TID  . lidocaine  1 application Urethral Once  . polyethylene glycol  17 g Oral Daily  . sodium chloride flush  10-40 mL Intracatheter Q12H     acetaminophen, HYDROcodone-acetaminophen, ondansetron (ZOFRAN) IV, sodium chloride flush  Exam: Alert, no distress No jvd, flat neck veins Chest clear bilat RRR no mrg Abd soft ntnd no mass or ascites +bs MS no joint effusions Ext no sig LE or UE edema GU foley in place, dark amber urine in large amounts, no blood Neuro alert nf  UA 07/18/15 > normal UA 08/28/15 > red, larg Hb, 100 prot, tntc rbc's, 1.006, 0-5 wbc/ epi  DateCreateGFR Feb '16 1.0464 Oct '161.28 Jan 3, 20171.17 Jan 7, 20171.00 Feb 17, 88828.0034 Feb 19, 20174.18 Aug 31, 2015 4.2312  Renal US 2/18 > 11-12 cm kidneys, no hydro, normal echotexture Feb 1st outpt CT abd > diffuse osseous mets, risk of hip fracture, normal kidneys/ liver/ etc.  Assessment: 1 Acute on CKD3 (baseline creat 1.1) -  unclear cause, possible contrast renal injury. Improved.  2 Hematuria - improved, per urol 3 HTN 40-50 yr hx. Home BP meds (atenolol/ hctz) are on hold. BP's ok 4 Prost cancer - s/p TURP/ cysto/ bilat orchiect on 2/16 5 SOB/volume excess - resolved   Plan - will sign off.    Kelly Splinter MD [facility] pager [phone]    cell [phone] 09/03/2015, 8:43 AM    Recent Labs Lab 08/30/15 1555 08/31/15 0802 09/01/15 0545 09/02/15 0543 09/03/15 0506  NA 138 145 145 147* 148*  K 4.7 4.1 4.2 4.1 3.8  CL 114* 118* 116* 110 109  CO2 16* 20* 20* 23 27  GLUCOSE 145* 152* 150* 121* 129*  BUN 44* 41* 37* 30* 19  CREATININE 4.86* 4.23* 4.77* 3.22* 1.99*  CALCIUM 7.5* 8.2* 7.8* 8.0* 7.7*  PHOS 2.5 2.3*  --   --   --     Recent Labs Lab 08/30/15 0607 08/30/15 1555 08/31/15 0802  AST 35  --   --   ALT 14*  --   --   ALKPHOS 215*  --   --   BILITOT 0.8  --   --   PROT 5.0*  --   --   ALBUMIN 2.9* 2.9* 2.9*    Recent Labs Lab 08/28/15 2023  08/30/15 0607 08/31/15 0802 09/01/15 1036  WBC 15.5*  --  12.3*  --  11.7*  NEUTROABS 11.6*  --   --   --  9.8*  HGB 11.3*  < > 9.4* 9.2* 10.3*  HCT 33.1*  < > 27.8* 27.3* 31.8*  MCV 88.3  --  89.7  --  93.5  PLT 166  --  121*  --  147*  < > = values in this interval not displayed.

## 2015-09-03 NOTE — Progress Notes (Signed)
VASCULAR LAB PRELIMINARY  PRELIMINARY  PRELIMINARY  PRELIMINARY  Bilateral lower extremity venous duplex completed.     Bilateral:  No evidence of DVT, or Baker's Cyst.  Findings of acute thrombus in GSV bilaterally-right leg in mid thigh to mid calf area with variced vein in calf area.  Left leg in mid thigh to proximal calf with branch variced vein.  Gave result to patients nurse Pam,RN  Janifer Adie, RVT, RDMS 09/03/2015, 2:36 PM

## 2015-09-03 NOTE — Progress Notes (Addendum)
[facility] Progress Note  Patient: Gregory [name] [id number]   PCP: Elizabeth Palau, MD DOB: [birth year]   DOA: 08/28/2015   DOS: 09/03/2015   Primary service: Franchot Gallo, MD   Brief Summary of Hospitalization:  As per the note dictated on day of consult, "82 year old male with a history of hypertension, BPH, metastatic prostate cancer with urinary retention, indwelling Foley catheter for the last 4-5 weeks with plans for TURP on 2/16, on finasteride, Casodex, Flomax, who is being seen today for worsening renal function. Patient recently diagnosed with metastatic adenocarcinoma, seen by urology and 07/28/15. Patient had CT abdomen pelvis with contrast on 08/12/15 at the urology office. During that time his creatinine was around 1.0. Post TURP on 2/16. Patient's creatinine was 3.05> now 3.59. Patient has gross hematuria and is on continuous bladder irrigation. Unable to strictly calculate I's and O's. Hemoglobin dropped from 15.0-9.7 today. Urine appears to be bloody but negative for UTI. Patient is followed by Dorena Dew, FNP who is his primary care. Patient currently denies any pain from his bony metastasis."  Subjective:  Dropped to 85% on RA with ambulation  Assessment and Plan: 1. S/P TURP Patient recently diagnosed with poorly differentiated adenocarcinoma of the prostate, status post TURP. Developed gross hematuria.  Management per urology  2. Acute kidney injury with hyperkalemia. Most like secondary to obstruction from hematuria. Given worsening of the renal function nephrology has been consulted to assist in the management. Appreciate input from Dr. Jonnie Finner. IV lasix d/c'd by Dr. Jonnie Finner- good diuresis Avoid nephrotoxic medications. Hyperkalemia has resolved.  3. Hyperglycemia. stable at present  4. Hematuria with acute blood loss anemia. Patient received one unit of PRBC. currently H&H remained stable recommend to monitor  Continue monitoring  H&H.  5. Essential hypertension. Blood pressure stable without medication Holding blood pressure medications.  6. Elevated tropinin -recheck x1- ? Demand ischemia -currently CP free -echo:Right ventricle: Systolic function was moderately reduced with apical akinesis  7.  Hypoxia -? Etiology- former smoker but no diagnosis of COPD nor evidence on chest x ray -duplex LE-- pain in right calf -progress to V/Q scan if negative  8. Hypernatremia -encourage PO water intake   Needs central line for IV access Out of bed to chair Ambulate patient    DVT Prophylaxis: mechanical compression device Nutrition: regular diet  Advance goals of care discussion: full code  Daily update, Procedures: none Antibiotics: Anti-infectives    None      Family Communication: no family was present at bedside   Intake/Output Summary (Last 24 hours) at 09/03/15 0855 Last data filed at 09/03/15 0532  Gross per 24 hour  Intake    480 ml  Output   2625 ml  Net  -2145 ml   Filed Weights   09/01/15 0526 09/02/15 0558 09/03/15 0532  Weight: 99.428 kg (219 lb 3.2 oz) 91.808 kg (202 lb 6.4 oz) 89.948 kg (198 lb 4.8 oz)    Objective: Physical Exam: Filed Vitals:   09/02/15 1426 09/02/15 2123 09/02/15 2214 09/03/15 0532  BP: 130/56  148/70 126/73  Pulse: 105  107 85  Temp:   99.3 F (37.4 C) 98.1 F (36.7 C)  TempSrc: Oral  Oral Oral  Resp: 18  18 18   Height:      Weight:    89.948 kg (198 lb 4.8 oz)  SpO2: 100% 98% 100% 100%    General:  Younger than stated age, NAD Cardiovascular: S1 and S2 Present, no Murmur, no JVD  Respiratory: Bilateral Air entry present and Clear to Auscultation, no Crackles, no wheezes Abdomen: Bowel Sound present, Soft and no tenderness   Data Reviewed: CBC:  Recent Labs Lab 08/28/15 2023 08/29/15 0556 08/30/15 0607 08/31/15 0802 09/01/15 1036  WBC 15.5*  --  12.3*  --  11.7*  NEUTROABS 11.6*  --   --   --  9.8*  HGB 11.3* 9.7* 9.4* 9.2* 10.3*  HCT  33.1* 28.9* 27.8* 27.3* 31.8*  MCV 88.3  --  89.7  --  93.5  PLT 166  --  121*  --  [id number]*   Basic Metabolic Panel:  Recent Labs Lab 08/30/15 1555 08/31/15 0802 09/01/15 0545 09/02/15 0543 09/03/15 0506  NA 138 145 145 147* 148*  K 4.7 4.1 4.2 4.1 3.8  CL 114* 118* 116* 110 109  CO2 16* 20* 20* 23 27  GLUCOSE 145* 152* 150* 121* 129*  BUN 44* 41* 37* 30* 19  CREATININE 4.86* 4.23* 4.77* 3.22* 1.99*  CALCIUM 7.5* 8.2* 7.8* 8.0* 7.7*  PHOS 2.5 2.3*  --   --   --    Liver Function Tests:  Recent Labs Lab 08/30/15 0607 08/30/15 1555 08/31/15 0802  AST 35  --   --   ALT 14*  --   --   ALKPHOS 215*  --   --   BILITOT 0.8  --   --   PROT 5.0*  --   --   ALBUMIN 2.9* 2.9* 2.9*   No results for input(s): LIPASE, AMYLASE in the last 168 hours. No results for input(s): AMMONIA in the last 168 hours.  Cardiac Enzymes:  Recent Labs Lab 09/01/15 1036 09/01/15 1601 09/01/15 2121 09/02/15 1506  TROPONINI 0.16* 0.26* 0.44* 0.22*   BNP (last 3 results)  Recent Labs  09/01/15 1036  BNP 1213.2*    CBG:  Recent Labs Lab 08/29/15 1117 08/29/15 1605 08/29/15 2127 08/30/15 0802  GLUCAP 118* 122* 111* 118*    No results found for this or any previous visit (from the past 240 hour(s)).   Studies: Dg Chest Port 1 View  09/02/2015  CLINICAL DATA:  Status post central line placement today. History of metastatic prostate cancer. EXAM: PORTABLE CHEST 1 VIEW COMPARISON:  Single view of the chest 09/01/2015. FINDINGS: A new right IJ catheter is in place with the tip projecting in the mid to lower superior vena cava. There is no pneumothorax. Marked elevation of the right hemidiaphragm and right basilar atelectasis are again seen. The left lung is clear. Heart size is normal. Scattered sclerotic bony lesions consistent with metastatic prostate cancer noted. IMPRESSION: Right IJ catheter projects in good position. Negative for pneumothorax. Subsegmental atelectasis right lung  base. Electronically Signed   By: Inge Rise M.D.   On: 09/02/2015 14:30     Scheduled Meds: . albuterol  3 mL Inhalation TID  . lidocaine  1 application Urethral Once  . polyethylene glycol  17 g Oral Daily  . sodium chloride flush  10-40 mL Intracatheter Q12H   Continuous Infusions:   PRN Meds: acetaminophen, HYDROcodone-acetaminophen, ondansetron (ZOFRAN) IV, sodium chloride flush  Time spent: 25 minutes  Eulogio Bear DO [id number]  09/03/2015 8:55 AM  If 7PM-7AM, please contact night-coverage at [email or web address], password [email or web address]

## 2015-09-03 NOTE — Progress Notes (Signed)
ANTICOAGULATION CONSULT NOTE - Initial Consult  Pharmacy Consult for Lovenox Indication: DVT  No Known Allergies  Patient Measurements: Height: 6' (182.9 cm) Weight: 198 lb 4.8 oz (89.948 kg) IBW/kg (Calculated) : 77.6  Vital Signs: Temp: 98 F (36.7 C) (02/23 1248) Temp Source: Oral (02/23 1248) BP: 115/68 mmHg (02/23 1248) Pulse Rate: 107 (02/23 1451)  Labs:  Recent Labs  09/01/15 0545  09/01/15 1036 09/01/15 1601 09/01/15 2121 09/02/15 0543 09/02/15 1506 09/03/15 0506  HGB  --   --  10.3*  --   --   --   --   --   HCT  --   --  31.8*  --   --   --   --   --   PLT  --   --  147*  --   --   --   --   --   CREATININE 4.77*  --   --   --   --  3.22*  --  1.99*  TROPONINI  --   < > 0.16* 0.26* 0.44*  --  0.22*  --   < > = values in this interval not displayed.  Estimated Creatinine Clearance: 30.3 mL/min (by C-G formula based on Cr of 1.99).   Medical History: Past Medical History  Diagnosis Date  . Hypertension   . Foley catheter in place     Urinary Retension  . BPH (benign prostatic hyperplasia)   . Prostate cancer [facility])   urologist- dr dahlstedt/  oncologist-  dr Ander Slade    Gleason 4+5,  PSA 18.74,  w/  Visceral and Extensive Bones METS--  pallitive radiation therapy and hormone therapy  . Bone metastases ([place])   . GERD (gastroesophageal reflux disease)     Assessment: 67 yoM with h/o metastatic prostate cancer with urinary retention, BPH, HTN s/p TURP 2/16 admitted with worsening renal function, gross hematuria, now with findings of acute thrombus in GSV bilaterally-right leg in mid thigh to mid calf area with variced vein in calf area.  NM study pending to evaluate for PE.  Today, 09/03/2015: - SCr improving, now 1.99, CrCl~35 ml/min - Anemic due to hematuria, Hgb improved to 10.3 - Plts 147K, low but relatively stable  Goal of Therapy:  Anti-Xa level 0.6-1 units/ml 4hrs after LMWH dose given Monitor platelets by anticoagulation protocol: Yes   Plan:   Lovenox 90 mg (1 mg/kg) SQ q12h F/u CBC at least q72h  Hershal Coria 09/03/2015,3:36 PM

## 2015-09-03 NOTE — Progress Notes (Signed)
  Subjective: Patient reports that he is feeling better. Pt desat w/ activity. LE venous study pending  Objective: Vital signs in last 24 hours: Temp:  [98.1 F (36.7 C)-99.3 F (37.4 C)] 98.1 F (36.7 C) (02/23 0532) Pulse Rate:  [85-107] 85 (02/23 0532) Resp:  [18] 18 (02/23 0532) BP: (126-148)/(56-73) 126/73 mmHg (02/23 0532) SpO2:  [98 %-100 %] 100 % (02/23 0532) Weight:  [89.948 kg (198 lb 4.8 oz)] 89.948 kg (198 lb 4.8 oz) (02/23 0532)  Intake/Output from previous day: 02/22 0701 - 02/23 0700 In: 530 [P.O.:480; IV Piggyback:50] Out: 2625 [Urine:2625] Intake/Output this shift:    Physical Exam:  Constitutional: Vital signs reviewed. WD WN in NAD   Eyes: PERRL, No scleral icterus.   Cardiovascular: RRR Pulmonary/Chest: Normal effort. Nasal O2 on Abdominal: Soft. Non-tender, non-distended, bowel sounds are normal, no masses, organomegaly, or guarding present.  Genitourinary: Extremities: No cyanosis or edema   Lab Results:  Recent Labs  09/01/15 1036  HGB 10.3*  HCT 31.8*   BMET  Recent Labs  09/02/15 0543 09/03/15 0506  NA 147* 148*  K 4.1 3.8  CL 110 109  CO2 23 27  GLUCOSE 121* 129*  BUN 30* 19  CREATININE 3.22* 1.99*  CALCIUM 8.0* 7.7*   No results for input(s): LABPT, INR in the last 72 hours. No results for input(s): LABURIN in the last 72 hours. No results found for this or any previous visit.  Studies/Results: Dg Chest Port 1 View  09/02/2015  CLINICAL DATA:  Status post central line placement today. History of metastatic prostate cancer. EXAM: PORTABLE CHEST 1 VIEW COMPARISON:  Single view of the chest 09/01/2015. FINDINGS: A new right IJ catheter is in place with the tip projecting in the mid to lower superior vena cava. There is no pneumothorax. Marked elevation of the right hemidiaphragm and right basilar atelectasis are again seen. The left lung is clear. Heart size is normal. Scattered sclerotic bony lesions consistent with metastatic  prostate cancer noted. IMPRESSION: Right IJ catheter projects in good position. Negative for pneumothorax. Subsegmental atelectasis right lung base. Electronically Signed   By: Inge Rise M.D.   On: 09/02/2015 14:30   Dg Chest Port 1 View  09/01/2015  CLINICAL DATA:  82 year old male with shortness of Breath. Initial encounter. Prostate cancer with metastatic disease to bone. EXAM: PORTABLE CHEST 1 VIEW COMPARISON:  Chest radiographs 04/01/2015. CT Abdomen and Pelvis 08/12/2015. FINDINGS: Portable AP semi upright view at 1003 hours. Stable elevation of the right hemidiaphragm with associated right lung base atelectasis. Mediastinal contours are stable and within normal limits. Visualized tracheal air column is within normal limits. Scattered ribs sclerosis, better demonstrated by CT. Sclerotic vertebral body metastasis are poorly visible radiographically. No pneumothorax, pulmonary edema, pleural effusion or acute pulmonary opacity. IMPRESSION: 1. No acute cardiopulmonary abnormality. 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 2. Widespread osseous metastatic disease, better demonstrated on recent CT Abdomen and Pelvis. Electronically Signed   By: Genevie Ann M.D.   On: 09/01/2015 10:17    Assessment/Plan:   POD 7 TURP/bilat orch for met PCa. Urine clearing--will give voiding trial  Agree w/ eval for poss DVT--ok to start anticoag if necessary but that will likely increase blood in urine--will manage w/ irrigation if necessary  ARF resolving ? etiol   LOS: 5 days   Franchot Gallo M 09/03/2015, 8:49 AM

## 2015-09-04 DIAGNOSIS — C61 Malignant neoplasm of prostate: Principal | ICD-10-CM

## 2015-09-04 LAB — CBC
HCT: 27.2 % — ABNORMAL LOW (ref 39.0–52.0)
Hemoglobin: 8.8 g/dL — ABNORMAL LOW (ref 13.0–17.0)
MCH: 29.8 pg (ref 26.0–34.0)
MCHC: 32.4 g/dL (ref 30.0–36.0)
MCV: 92.2 fL (ref 78.0–100.0)
PLATELETS: 132 10*3/uL — AB (ref 150–400)
RBC: 2.95 MIL/uL — AB (ref 4.22–5.81)
RDW: 15.5 % (ref 11.5–15.5)
WBC: 7.7 10*3/uL (ref 4.0–10.5)

## 2015-09-04 LAB — BASIC METABOLIC PANEL
Anion gap: 10 (ref 5–15)
BUN: 16 mg/dL (ref 6–20)
CALCIUM: 8 mg/dL — AB (ref 8.9–10.3)
CHLORIDE: 107 mmol/L (ref 101–111)
CO2: 28 mmol/L (ref 22–32)
CREATININE: 1.5 mg/dL — AB (ref 0.61–1.24)
GFR, EST AFRICAN AMERICAN: 48 mL/min — AB (ref >60)
GFR, EST NON AFRICAN AMERICAN: 41 mL/min — AB (ref >60)
Glucose, Bld: 110 mg/dL — ABNORMAL HIGH (ref 65–99)
Potassium: 3.6 mmol/L (ref 3.5–5.1)
SODIUM: 145 mmol/L (ref 135–145)

## 2015-09-04 MED ORDER — ENOXAPARIN SODIUM 100 MG/ML ~~LOC~~ SOLN
1.0000 mg/kg | SUBCUTANEOUS | Status: AC
  Administered 2015-09-04: 90 mg via SUBCUTANEOUS
  Filled 2015-09-04: qty 1

## 2015-09-04 MED ORDER — ENOXAPARIN SODIUM 100 MG/ML ~~LOC~~ SOLN
1.0000 mg/kg | Freq: Once | SUBCUTANEOUS | Status: DC
  Filled 2015-09-04: qty 1

## 2015-09-04 MED ORDER — ENOXAPARIN SODIUM 100 MG/ML ~~LOC~~ SOLN: 1.0000 mg/kg | Freq: Two times a day (BID) | SUBCUTANEOUS | Status: DC

## 2015-09-04 NOTE — Progress Notes (Signed)
Subjective: Patient reports that he is voiding well, and emptying completely.  No discomfort with urination.  He is no longer having Robbins with breathing.  Objective: Vital signs in last 24 hours: Temp:  [98 F (36.7 C)-98.4 F (36.9 C)] 98.4 F (36.9 C) (02/23 2106) Pulse Rate:  [102-107] 102 (02/23 2106) Resp:  [18-20] 20 (02/23 2106) BP: (115-126)/(68-99) 126/99 mmHg (02/23 2106) SpO2:  [99 %-100 %] 100 % (02/23 2106) Weight:  [92.035 kg (202 lb 14.4 oz)] 92.035 kg (202 lb 14.4 oz) (02/23 2106)  Intake/Output from previous day: 02/23 0701 - 02/24 0700 In: 1350 [P.O.:1290; I.V.:60] Out: 2900 [Urine:2900] Intake/Output this shift:    Physical Exam:  Constitutional: Vital signs reviewed. WD WN in NAD   Eyes: PERRL, No scleral icterus.   Pulmonary/Chest: Normal effort  Urine is slightly pink, without clots Lab Results:  Recent Labs  09/01/15 1036 09/04/15 0430  HGB 10.3* 8.8*  HCT 31.8* 27.2*   BMET  Recent Labs  09/03/15 0506 09/04/15 0430  NA 148* 145  K 3.8 3.6  CL 109 107  CO2 27 28  GLUCOSE 129* 110*  BUN 19 16  CREATININE 1.99* 1.50*  CALCIUM 7.7* 8.0*   No results for input(s): LABPT, INR in the last 72 hours. No results for input(s): LABURIN in the last 72 hours. No results found for this or any previous visit.  Studies/Results: Nm Pulmonary Perf And Vent  09/03/2015  CLINICAL DATA:  Progressive shortness of breath hypoxia for several months. EXAM: NUCLEAR MEDICINE VENTILATION - PERFUSION LUNG SCAN TECHNIQUE: Ventilation images were obtained in multiple projections using inhaled aerosol Tc-15m DTPA. Perfusion images were obtained in multiple projections after intravenous injection of Tc-49m MAA. RADIOPHARMACEUTICALS:  33.0 Technetium-19m DTPA aerosol inhalation and 4.1 Technetium-60m MAA IV COMPARISON:  Chest radiograph on 09/02/2015 FINDINGS: Ventilation: No focal ventilation defect. Chronic elevation of right hemidiaphragm noted. Perfusion: No  wedge shaped peripheral perfusion defects to suggest acute pulmonary embolism. IMPRESSION: Normal study.  No evidence of pulmonary embolism. Electronically Signed   By: Earle Gell M.D.   On: 09/03/2015 17:20   Dg Chest Port 1 View  09/02/2015  CLINICAL DATA:  Status post central line placement today. History of metastatic prostate cancer. EXAM: PORTABLE CHEST 1 VIEW COMPARISON:  Single view of the chest 09/01/2015. FINDINGS: A new right IJ catheter is in place with the tip projecting in the mid to lower superior vena cava. There is no pneumothorax. Marked elevation of the right hemidiaphragm and right basilar atelectasis are again seen. The left lung is clear. Heart size is normal. Scattered sclerotic bony lesions consistent with metastatic prostate cancer noted. IMPRESSION: Right IJ catheter projects in good position. Negative for pneumothorax. Subsegmental atelectasis right lung base. Electronically Signed   By: Inge Rise M.D.   On: 09/02/2015 14:30    Assessment/Plan:   Postop day #8 TURP/bilateral orchiectomy for metastatic prostate cancer.  He is doing better.  He is voiding well, urine is clearing.  Apparently, he will be started on Lovenox-I think it worthwhile watching him a day or 2 to make sure he does not have significant bleeding with this, but at this point, if he does not have issues bleeding, he can be discharged from the urologic standpoint.  The patient needs palliative radiotherapy to the right hip-I will have radiotherapy contact the patient regarding scheduling this.  The patient has had a prior consultation with Dr. Tammi Klippel.   LOS: 6 days   Jorja Loa 09/04/2015, 9:02  AM    

## 2015-09-04 NOTE — Progress Notes (Signed)
PT Cancellation Note  Patient Details Name: Gregory [name] MRN: [id number] DOB: Mar 16, 1929   Cancelled Treatment:    Reason Eval/Treat Not Completed: Medical issues which prohibited therapy Patient with +DVT and has not had anticoagulant today.  Discussed with RN.  Will check back as schedule permits.   [name],KATHrine E 09/04/2015, 11:31 AM Carmelia Bake, PT, DPT 09/04/2015 Pager: [phone]

## 2015-09-04 NOTE — Progress Notes (Signed)
[facility] Progress Note  Patient: Gregory [name] [id number]   PCP: Elizabeth Palau, MD DOB: May 20, 1929   DOA: 08/28/2015   DOS: 09/04/2015   Primary service: Geradine Girt, DO   Brief Summary of Hospitalization:  As per the note dictated on day of consult, "82 year old male with a history of hypertension, BPH, metastatic prostate cancer with urinary retention, indwelling Foley catheter for the last 4-5 weeks with plans for TURP on 2/16, on finasteride, Casodex, Flomax, who is being seen today for worsening renal function. Patient recently diagnosed with metastatic adenocarcinoma, seen by urology and 07/28/15. Patient had CT abdomen pelvis with contrast on 08/12/15 at the urology office. During that time his creatinine was around 1.0. Post TURP on 2/16. Patient's creatinine was 3.05> now 3.59. Patient has gross hematuria and is on continuous bladder irrigation. Unable to strictly calculate I's and O's. Hemoglobin dropped from 15.0-9.7 today. Urine appears to be bloody but negative for UTI. Patient is followed by Dorena Dew, FNP who is his primary care. Patient currently denies any pain from his bony metastasis."  Subjective:  Dropped to 85% on RA with ambulation  Assessment and Plan:  S/P TURP Patient recently diagnosed with poorly differentiated adenocarcinoma of the prostate, status post TURP. Developed gross hematuria.  Management per urology  Thrombophlebitis not DVT -spoke with Dr. Donnetta Hutching Repeat u/s in a few weeks if worsening symptoms -treat with ibuprofen, elevate and time  Acute kidney injury with hyperkalemia. Most like secondary to obstruction from hematuria. Given worsening of the renal function nephrology has been consulted to assist in the management. Appreciate input from Dr. Jonnie Finner. IV lasix d/c'd by Dr. Jonnie Finner- good diuresis Avoid nephrotoxic medications. Hyperkalemia has resolved.  Hyperglycemia. stable at present  Hematuria with acute blood  loss anemia. stable.  Essential hypertension. Blood pressure stable without medication Holding blood pressure medications.  Elevated tropinin -recheck x1- ? Demand ischemia -currently CP free -echo: Right ventricle: Systolic function was moderately reduced with apical akinesis  Hypoxia -? Etiology- former smoker but no diagnosis of COPD nor evidence on chest x ray  V/Q scan negative Home O2 eval  Hypernatremia -encourage PO water intake   PT eval, ambulate Home soon    DVT Prophylaxis: mechanical compression device Nutrition: regular diet  Advance goals of care discussion: full code  Daily update, Procedures: none Antibiotics: Anti-infectives    None      Family Communication: no family was present at bedside   Intake/Output Summary (Last 24 hours) at 09/04/15 1607 Last data filed at 09/04/15 1300  Gross per 24 hour  Intake    810 ml  Output   2875 ml  Net  -2065 ml   Filed Weights   09/02/15 0558 09/03/15 0532 09/03/15 2106  Weight: 91.808 kg (202 lb 6.4 oz) 89.948 kg (198 lb 4.8 oz) 92.035 kg (202 lb 14.4 oz)    Objective: Physical Exam: Filed Vitals:   09/03/15 1451 09/03/15 2106 09/04/15 1100 09/04/15 1413  BP:  126/99 130/72 130/72  Pulse: 107 102 89 99  Temp:  98.4 F (36.9 C) 98.4 F (36.9 C) 97.8 F (36.6 C)  TempSrc:  Oral Oral Oral  Resp: 18 20 16 18   Height:      Weight:  92.035 kg (202 lb 14.4 oz)    SpO2: 99% 100% 100% 100%    General:  Younger than stated age, NAD Cardiovascular: S1 and S2 Present, no Murmur, no JVD Respiratory: Bilateral Air entry present and Clear to Auscultation,  no Crackles, no wheezes Abdomen: Bowel Sound present, Soft and no tenderness   Data Reviewed: CBC:  Recent Labs Lab 08/28/15 2023 08/29/15 0556 08/30/15 0607 08/31/15 0802 09/01/15 1036 09/04/15 0430  WBC 15.5*  --  12.3*  --  11.7* 7.7  NEUTROABS 11.6*  --   --   --  9.8*  --   HGB 11.3* 9.7* 9.4* 9.2* 10.3* 8.8*  HCT 33.1* 28.9* 27.8*  27.3* 31.8* 27.2*  MCV 88.3  --  89.7  --  93.5 92.2  PLT 166  --  121*  --  147* [id number]*   Basic Metabolic Panel:  Recent Labs Lab 08/30/15 1555 08/31/15 0802 09/01/15 0545 09/02/15 0543 09/03/15 0506 09/04/15 0430  NA 138 145 145 147* 148* 145  K 4.7 4.1 4.2 4.1 3.8 3.6  CL 114* 118* 116* 110 109 107  CO2 16* 20* 20* 23 27 28   GLUCOSE 145* 152* 150* 121* 129* 110*  BUN 44* 41* 37* 30* 19 16  CREATININE 4.86* 4.23* 4.77* 3.22* 1.99* 1.50*  CALCIUM 7.5* 8.2* 7.8* 8.0* 7.7* 8.0*  PHOS 2.5 2.3*  --   --   --   --    Liver Function Tests:  Recent Labs Lab 08/30/15 0607 08/30/15 1555 08/31/15 0802  AST 35  --   --   ALT 14*  --   --   ALKPHOS 215*  --   --   BILITOT 0.8  --   --   PROT 5.0*  --   --   ALBUMIN 2.9* 2.9* 2.9*   No results for input(s): LIPASE, AMYLASE in the last 168 hours. No results for input(s): AMMONIA in the last 168 hours.  Cardiac Enzymes:  Recent Labs Lab 09/01/15 1036 09/01/15 1601 09/01/15 2121 09/02/15 1506  TROPONINI 0.16* 0.26* 0.44* 0.22*   BNP (last 3 results)  Recent Labs  09/01/15 1036  BNP 1213.2*    CBG:  Recent Labs Lab 08/29/15 1117 08/29/15 1605 08/29/15 2127 08/30/15 0802  GLUCAP 118* 122* 111* 118*    No results found for this or any previous visit (from the past 240 hour(s)).   Studies: Nm Pulmonary Perf And Vent  09/03/2015  CLINICAL DATA:  Progressive shortness of breath hypoxia for several months. EXAM: NUCLEAR MEDICINE VENTILATION - PERFUSION LUNG SCAN TECHNIQUE: Ventilation images were obtained in multiple projections using inhaled aerosol Tc-23m DTPA. Perfusion images were obtained in multiple projections after intravenous injection of Tc-54m MAA. RADIOPHARMACEUTICALS:  33.0 Technetium-28m DTPA aerosol inhalation and 4.1 Technetium-38m MAA IV COMPARISON:  Chest radiograph on 09/02/2015 FINDINGS: Ventilation: No focal ventilation defect. Chronic elevation of right hemidiaphragm noted. Perfusion: No wedge  shaped peripheral perfusion defects to suggest acute pulmonary embolism. IMPRESSION: Normal study.  No evidence of pulmonary embolism. Electronically Signed   By: Earle Gell M.D.   On: 09/03/2015 17:20     Scheduled Meds: . enoxaparin (LOVENOX) injection  1 mg/kg Subcutaneous Once   Followed by  . [START ON 09/05/2015] enoxaparin (LOVENOX) injection  1 mg/kg Subcutaneous Q12H  . lidocaine  1 application Urethral Once  . polyethylene glycol  17 g Oral Daily  . sodium chloride flush  10-40 mL Intracatheter Q12H   Continuous Infusions:   PRN Meds: acetaminophen, albuterol, HYDROcodone-acetaminophen, ondansetron (ZOFRAN) IV, sodium chloride flush, technetium TC 44M diethylenetriame-pentaacetic acid  Time spent: 25 minutes  Eulogio Bear DO [id number]  09/04/2015 4:07 PM  If 7PM-7AM, please contact night-coverage at [email or web address], password [facility]

## 2015-09-04 NOTE — Progress Notes (Signed)
ANTICOAGULATION CONSULT NOTE - Initial Consult  Pharmacy Consult for Lovenox Indication: DVT  No Known Allergies  Patient Measurements: Height: 6' (182.9 cm) Weight: 202 lb 14.4 oz (92.035 kg) IBW/kg (Calculated) : 77.6  Vital Signs:    Labs:  Recent Labs  09/01/15 1601 09/01/15 2121 09/02/15 0543 09/02/15 1506 09/03/15 0506 09/04/15 0430  HGB  --   --   --   --   --  8.8*  HCT  --   --   --   --   --  27.2*  PLT  --   --   --   --   --  132*  CREATININE  --   --  3.22*  --  1.99* 1.50*  TROPONINI 0.26* 0.44*  --  0.22*  --   --     Estimated Creatinine Clearance: 40.2 mL/min (by C-G formula based on Cr of 1.5).   Medical History: Past Medical History  Diagnosis Date  . Hypertension   . Foley catheter in place     Urinary Retension  . BPH (benign prostatic hyperplasia)   . Prostate cancer [facility])   urologist- dr dahlstedt/  oncologist-  dr Ander Slade    Gleason 4+5,  PSA 18.74,  w/  Visceral and Extensive Bones METS--  pallitive radiation therapy and hormone therapy  . Bone metastases ([place])   . GERD (gastroesophageal reflux disease)     Assessment: 18 yoM with h/o metastatic prostate cancer with urinary retention, BPH, HTN s/p TURP 2/16 admitted with worsening renal function, gross hematuria, now with findings of acute thrombus in GSV bilaterally-right leg in mid thigh to mid calf area with variced vein in calf area.  NM study negative for PE.  No anticoagulants PTA noted. Lovenox ordered to start 2/23 then discontinued before any doses administered.  Consult resumed 2/24 per pharmacy.     Today, 09/04/2015: - SCr improving, CrCl~40 ml/min - Anemic due to hematuria, hgb down to 8.8 today.  - Plts also low, 132K.    Goal of Therapy:  Anti-Xa level 0.6-1 units/ml 4hrs after LMWH dose given Monitor platelets by anticoagulation protocol: Yes   Plan:  Restart Lovenox 90 mg (1 mg/kg) SQ q12h F/u CBC at least q72h F/u renal fxn If remains on lovenox, f/u when  appropriate to change to 1.5mg /kg/day dosing  Ralene Bathe, PharmD, BCPS 09/04/2015, 12:52 PM  Pager: [phone]

## 2015-09-05 LAB — GLUCOSE, CAPILLARY: GLUCOSE-CAPILLARY: 96 mg/dL (ref 65–99)

## 2015-09-05 LAB — BASIC METABOLIC PANEL
Anion gap: 10 (ref 5–15)
BUN: 14 mg/dL (ref 6–20)
CALCIUM: 8.5 mg/dL — AB (ref 8.9–10.3)
CHLORIDE: 109 mmol/L (ref 101–111)
CO2: 27 mmol/L (ref 22–32)
CREATININE: 1.38 mg/dL — AB (ref 0.61–1.24)
GFR calc Af Amer: 53 mL/min — ABNORMAL LOW (ref >60)
GFR, EST NON AFRICAN AMERICAN: 45 mL/min — AB (ref >60)
Glucose, Bld: 113 mg/dL — ABNORMAL HIGH (ref 65–99)
Potassium: 3.7 mmol/L (ref 3.5–5.1)
SODIUM: 146 mmol/L — AB (ref 135–145)

## 2015-09-05 MED ORDER — ATENOLOL 50 MG PO TABS: 100.0000 mg | ORAL_TABLET | Freq: Every day | ORAL | Status: DC

## 2015-09-05 MED ORDER — POLYETHYLENE GLYCOL 3350 17 G PO PACK: 17.0000 g | PACK | Freq: Every day | ORAL | Status: AC | PRN

## 2015-09-05 NOTE — Progress Notes (Signed)
Pt ambulated the length of the hall. Oxygen sat 100% before , during, and after.  HR increased to 130 at its peak.  Pt did not complain of shortness of breath during ambulation.  Tolerated well.

## 2015-09-05 NOTE — Progress Notes (Signed)
Patient ID: Gregory [name], male   DOB: [birth year], 82 y.o.   MRN: [id number]    Subjective: Pt doing well.  No complaints.  Urine remains clear per patient.  Objective: Vital signs in last 24 hours: Temp:  [97.7 F (36.5 C)-98.4 F (36.9 C)] 97.7 F (36.5 C) (02/24 2244) Pulse Rate:  [89-99] 94 (02/24 2244) Resp:  [16-18] 18 (02/24 2244) BP: (130-135)/(72-79) 135/79 mmHg (02/24 2244) SpO2:  [100 %] 100 % (02/24 2244) Weight:  [87.363 kg (192 lb 9.6 oz)] 87.363 kg (192 lb 9.6 oz) (02/25 0500)  Intake/Output from previous day: 02/24 0701 - 02/25 0700 In: 720 [P.O.:720] Out: 1775 [Urine:1775] Intake/Output this shift: Total I/O In: 180 [P.O.:180] Out: 500 [Urine:500]  Physical Exam:  General: Alert and oriented  Lab Results:  Recent Labs  09/04/15 0430  HGB 8.8*  HCT 27.2*   BMET  Recent Labs  09/04/15 0430 09/05/15 0850  NA 145 146*  K 3.6 3.7  CL 107 109  CO2 28 27  GLUCOSE 110* 113*  BUN 16 14  CREATININE 1.50* 1.38*  CALCIUM 8.0* 8.5*     Studies/Results:  Assessment/Plan: - Ok for discharge home from urologic standpoint.  Will arrange outpatient f/u with Dr. Diona Fanti.   LOS: 7 days   Kaelyn Innocent,LES 09/05/2015, 10:14 AM

## 2015-09-05 NOTE — Discharge Summary (Signed)
Physician Discharge Summary  Gregory [name] [id number] DOB: 1928/09/23 DOA: 08/28/2015  PCP: Dorena Dew, FNP  Admit date: 08/28/2015 Discharge date: 09/05/2015  Time spent: 35 minutes  Recommendations for Outpatient Follow-up:  1. Bmp 1 week 2. Follow up b/l LE duplex if LE swelling or pain worsens (has superficial thrombophlebitis symptomatic treatment) 3. Follow up with urology   Discharge Diagnoses:  Principal Problem:   S/P TURP Active Problems:   Essential hypertension   Hyperglycemia   Prostate cancer (HCC)   Hematuria   Urinary retention   AKI (acute kidney injury) ([place])   Hyperkalemia   Elevated troponin   Diastolic dysfunction with acute on chronic heart failure (HCC)   Difficult intravenous access   Discharge Condition: improved  Diet recommendation: cardiac  Filed Weights   09/03/15 0532 09/03/15 2106 09/05/15 0500  Weight: 89.948 kg (198 lb 4.8 oz) 92.035 kg (202 lb 14.4 oz) 87.363 kg (192 lb 9.6 oz)    History of present illness:  82 year old male patient of Dr. Diona Fanti who was recently diagnosed with a poorly differentiated adenocarcinoma the prostate. PSA was around 20. Biopsy showed Gleason's 9 disease and he had evidence of metastatic disease on imaging. The patient was taken yesterday to the operating room for cystoscopy as well as transurethral vaporization of the prostate and a bilateral simple orchiectomy for treatment of his metastatic prostate cancer. His Foley catheter was removed this morning and it's unclear how successful he was with voiding. He got discharged around noon today. He presented to the emergency room this evening with inability to void. He was noted to have significant gross hematuria and 18 French three-way Foley catheter was inserted. Several bags of irrigant were utilized. Urine continues quite bloody and therefore we were consulted. He currently denies any abdominal pain. He really has no significant complaints at this  time.  Hospital Course:  S/P TURP Patient recently diagnosed with poorly differentiated adenocarcinoma of the prostate, status post TURP. Developed gross hematuria. Management per urology  Thrombophlebitis not DVT -spoke with Dr. Donnetta Hutching Repeat u/s in a few weeks if worsening symptoms -treat with ibuprofen, elevate and time  Acute kidney injury with hyperkalemia. Most like secondary to obstruction from hematuria. Given worsening of the renal function nephrology has been consulted to assist in the management. Appreciate input from Dr. Jonnie Finner. IV lasix d/c'd by Dr. Jonnie Finner- good diuresis Avoid nephrotoxic medications. Hyperkalemia has resolved.  Hyperglycemia. stable at present  Hematuria with acute blood loss anemia. stable.  Essential hypertension. Blood pressure stable without medication Holding blood pressure medications.  Elevated tropinin -? Demand ischemia along with renal failure -echo: Right ventricle: Systolic function was moderately reduced with apical akinesis  Hypoxia -resolved with diuresis V/Q scan negative Home O2 eval-- 100%RA  Hypernatremia -encourage PO water intake   Procedures:    Consultations:  urology  Discharge Exam: Filed Vitals:   09/04/15 2244 09/05/15 1400  BP: 135/79 154/70  Pulse: 94 107  Temp: 97.7 F (36.5 C) 97.7 F (36.5 C)  Resp: 18 18    General:awake, NAD   Discharge Instructions   Discharge Instructions    Diet - low sodium heart healthy    Complete by:  As directed      Discharge instructions    Complete by:  As directed   BMP 1 week Follow up with urology as directed Elevate legs when not up walking--- if increased swelling or pain- will need repeat ultrasound of b/l LE     Increase activity slowly  Complete by:  As directed           Discharge Medication List as of 09/05/2015  1:13 PM    START taking these medications   Details  polyethylene glycol (MIRALAX / GLYCOLAX) packet Take 17 g by  mouth daily as needed for mild constipation., Starting 09/05/2015, Until Discontinued, No Print      CONTINUE these medications which have CHANGED   Details  atenolol (TENORMIN) 50 MG tablet Take 2 tablets (100 mg total) by mouth daily., Starting 09/05/2015, Until Discontinued, Normal      CONTINUE these medications which have NOT CHANGED   Details  Multiple Vitamins-Minerals (MULTIVITAMIN WITH MINERALS) tablet Take 1 tablet by mouth every morning. , Until Discontinued, Historical Med      STOP taking these medications     cephALEXin (KEFLEX) 500 MG capsule      hydrochlorothiazide (HYDRODIURIL) 25 MG tablet        No Known Allergies Follow-up Information    Follow up with DAHLSTEDT, Lillette Boxer, MD.   Specialty:  Urology   Why:  Will call to schedule follow up.   Contact information:   [address] [place] [address] [phone]       Follow up with Dorena Dew, FNP In 1 week.   Specialty:  Family Medicine   Contact information:   [place] [place] [address] [phone]        The results of significant diagnostics from this hospitalization (including imaging, microbiology, ancillary and laboratory) are listed below for reference.    Significant Diagnostic Studies: Nm Bone Scan Whole Body  08/12/2015  CLINICAL DATA:  Prostate cancer. EXAM: NUCLEAR MEDICINE WHOLE BODY BONE SCAN TECHNIQUE: Whole body anterior and posterior images were obtained approximately 3 hours after intravenous injection of radiopharmaceutical. RADIOPHARMACEUTICALS:  26 mCi Technetium-51m MDP IV COMPARISON:  CT abdomen pelvis 08/12/2015. FINDINGS: Numerous foci of increased uptake are seen in the humeri, sternum, ribs, spine, pelvis and proximal femora. Many of these foci of uptake correspond to sclerotic lesions on CT performed today. Uptake is most intense in the proximal right femur. IMPRESSION: Diffuse osseous metastatic disease involving the axial and appendicular  skeleton. Uptake is most intense in the proximal right femur. As suggested on CT performed earlier today, patient is likely at increased risk for pathologic fracture. Electronically Signed   By: Lorin Picket M.D.   On: 08/12/2015 13:23   US Renal  08/29/2015  CLINICAL DATA:  Acute kidney injury EXAM: RENAL / URINARY TRACT ULTRASOUND COMPLETE COMPARISON:  CT abdomen 08/12/2015 FINDINGS: Right Kidney: Length: 11.6 cm. 3.8 cm cyst left lower pole as noted on CT. Echogenicity within normal limits. No mass or hydronephrosis visualized. Left Kidney: Length: 12.1 cm in length. Difficult imaging left kidney due to body habitus. Echogenicity within normal limits. No mass or hydronephrosis visualized. Bladder: Foley catheter.  Echogenic urine.  The patient has hematuria. IMPRESSION: Negative for hydronephrosis.  Right renal cyst Echogenic urine in the bladder compatible with hematuria. Electronically Signed   By: Franchot Gallo M.D.   On: 08/29/2015 11:01   Nm Pulmonary Perf And Vent  09/03/2015  CLINICAL DATA:  Progressive shortness of breath hypoxia for several months. EXAM: NUCLEAR MEDICINE VENTILATION - PERFUSION LUNG SCAN TECHNIQUE: Ventilation images were obtained in multiple projections using inhaled aerosol Tc-67m DTPA. Perfusion images were obtained in multiple projections after intravenous injection of Tc-68m MAA. RADIOPHARMACEUTICALS:  33.0 Technetium-60m DTPA aerosol inhalation and 4.1 Technetium-31m MAA IV COMPARISON:  Chest radiograph on 09/02/2015 FINDINGS: Ventilation: No focal ventilation defect. Chronic elevation of right hemidiaphragm noted. Perfusion: No wedge shaped peripheral perfusion defects to suggest acute pulmonary embolism. IMPRESSION: Normal study.  No evidence of pulmonary embolism. Electronically Signed   By: Earle Gell M.D.   On: 09/03/2015 17:20   Dg Chest Port 1 View  09/02/2015  CLINICAL DATA:  Status post central line placement today. History of metastatic prostate cancer.  EXAM: PORTABLE CHEST 1 VIEW COMPARISON:  Single view of the chest 09/01/2015. FINDINGS: A new right IJ catheter is in place with the tip projecting in the mid to lower superior vena cava. There is no pneumothorax. Marked elevation of the right hemidiaphragm and right basilar atelectasis are again seen. The left lung is clear. Heart size is normal. Scattered sclerotic bony lesions consistent with metastatic prostate cancer noted. IMPRESSION: Right IJ catheter projects in good position. Negative for pneumothorax. Subsegmental atelectasis right lung base. Electronically Signed   By: Inge Rise M.D.   On: 09/02/2015 14:30   Dg Chest Port 1 View  09/01/2015  CLINICAL DATA:  82 year old male with shortness of Breath. Initial encounter. Prostate cancer with metastatic disease to bone. EXAM: PORTABLE CHEST 1 VIEW COMPARISON:  Chest radiographs 04/01/2015. CT Abdomen and Pelvis 08/12/2015. FINDINGS: Portable AP semi upright view at 1003 hours. Stable elevation of the right hemidiaphragm with associated right lung base atelectasis. Mediastinal contours are stable and within normal limits. Visualized tracheal air column is within normal limits. Scattered ribs sclerosis, better demonstrated by CT. Sclerotic vertebral body metastasis are poorly visible radiographically. No pneumothorax, pulmonary edema, pleural effusion or acute pulmonary opacity. IMPRESSION: 1. No acute cardiopulmonary abnormality. 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 2. Widespread osseous metastatic disease, better demonstrated on recent CT Abdomen and Pelvis. Electronically Signed   By: Genevie Ann M.D.   On: 09/01/2015 10:17    Microbiology: No results found for this or any previous visit (from the past 240 hour(s)).   Labs: Basic Metabolic Panel:  Recent Labs Lab 08/30/15 1555 08/31/15 0802 09/01/15 0545 09/02/15 0543 09/03/15 0506 09/04/15 0430 09/05/15 0850  NA 138 145 145 147* 148* 145 146*  K 4.7 4.1 4.2 4.1 3.8 3.6 3.7   CL 114* 118* 116* 110 109 107 109  CO2 16* 20* 20* 23 27 28 27   GLUCOSE 145* 152* 150* 121* 129* 110* 113*  BUN 44* 41* 37* 30* 19 16 14   CREATININE 4.86* 4.23* 4.77* 3.22* 1.99* 1.50* 1.38*  CALCIUM 7.5* 8.2* 7.8* 8.0* 7.7* 8.0* 8.5*  PHOS 2.5 2.3*  --   --   --   --   --    Liver Function Tests:  Recent Labs Lab 08/30/15 0607 08/30/15 1555 08/31/15 0802  AST 35  --   --   ALT 14*  --   --   ALKPHOS 215*  --   --   BILITOT 0.8  --   --   PROT 5.0*  --   --   ALBUMIN 2.9* 2.9* 2.9*   No results for input(s): LIPASE, AMYLASE in the last 168 hours. No results for input(s): AMMONIA in the last 168 hours. CBC:  Recent Labs Lab 08/30/15 0607 08/31/15 0802 09/01/15 1036 09/04/15 0430  WBC 12.3*  --  11.7* 7.7  NEUTROABS  --   --  9.8*  --   HGB 9.4* 9.2* 10.3* 8.8*  HCT 27.8* 27.3* 31.8* 27.2*  MCV 89.7  --  93.5 92.2  PLT 121*  --  147* 132*   Cardiac Enzymes:  Recent Labs Lab 09/01/15 1036 09/01/15 1601 09/01/15 2121 09/02/15 1506  TROPONINI 0.16* 0.26* 0.44* 0.22*   BNP: BNP (last 3 results)  Recent Labs  09/01/15 1036  BNP 1213.2*    ProBNP (last 3 results) No results for input(s): PROBNP in the last 8760 hours.  CBG:  Recent Labs Lab 08/29/15 2127 08/30/15 0802 09/04/15 2228  GLUCAP 111* 118* 96       Signed:  Tapanga Ottaway U Romaldo Saville DO.  [facility] 09/05/2015, 4:29 PM

## 2015-09-07 ENCOUNTER — Telehealth: Payer: Self-pay | Admitting: Radiation Oncology

## 2015-09-07 NOTE — Telephone Encounter (Addendum)
Phoned patient's daughter, Jeanne Ivan, to remind her of follow up with Dr. Tammi Klippel for 09/24/2015 at 0800 or to offer an earlier date. No answer. No option to leave a message because the mailbox is full. Will attempt to reach her again later today.

## 2015-09-09 ENCOUNTER — Other Ambulatory Visit: Payer: Self-pay | Admitting: Family Medicine

## 2015-09-09 ENCOUNTER — Telehealth: Payer: Self-pay | Admitting: Radiation Oncology

## 2015-09-09 NOTE — Telephone Encounter (Signed)
-----   Message from Hayden Pedro, [place] sent at 09/07/2015  5:56 AM EST ----- Lamonte Sakai- ok well i guess urology wants him seen- we can give him an earlier appt but paulette will have to be ok with it. ----- Message -----    From: Tyler Pita, MD    Sent: 09/06/2015   6:39 PM      To: Franchot Gallo, MD, #  Thanks Alesia Richards get him back in.  Matt   ----- Message -----    From: Franchot Gallo, MD    Sent: 09/04/2015   8:58 AM      To: Tyler Pita, MD  Matt-this man had a TURP and an orchiectomy last week.  He got admitted for renal failure.  He is getting better.  He would like to proceed with his palliative radiotherapy to the hip.  The best contact is his daughter, Jeanne Ivan.  If he would, have your staff call and set up an appointment.  Thanks

## 2015-09-09 NOTE — Telephone Encounter (Signed)
Phoned patient's daughter to assess her father's status since hospital discharge and confirm upcoming appointment with Dr. Tammi Klippel. No answer. Mailbox full. Will continue to reach out.

## 2015-09-15 ENCOUNTER — Telehealth: Payer: Self-pay | Admitting: Radiation Oncology

## 2015-09-15 NOTE — Telephone Encounter (Signed)
Finally able to reach patient's daughter, Jeanne Ivan. She confirms they will be present for 09/24/2015 appointment at 0800 with Dr. Tammi Klippel to discuss treatment options.

## 2015-09-23 ENCOUNTER — Encounter: Payer: Self-pay | Admitting: Radiation Oncology

## 2015-09-23 NOTE — Progress Notes (Signed)
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 seminal vesicle invasion, perineural invasion, and extraprostatic extension  Sites of Visceral and Bony Metastatic Disease: "Diffuse osseous metastatic disease of bilateral Humeri, cervical thoracic and lumbar veterbral bodies, ribs, sternum, pelvis and femurs bilaterally with significant involvementof the right proximal femur  Location(s) of Symptomatic Metastases: Right Proximal Femur most significant  Past/Anticipated chemotherapy by medical oncology, if any: Unknown  Pain on a scale of 0-10 is: denies pain  If Spine Met(s), symptoms, if any, include:  Bowel/Bladder retention or incontinence : Hx of urinary retention. TURP done. Reports urinary urgency and incontinence x 2 weeks, nocturia x 3, reports he no longer takes proscar  Numbness or weakness in extremities (please describe): None per patient account. No edema of lower extremities noted.   Current Decadron regimen, if applicable: no  Ambulatory status? Walker? Wheelchair?: Ambulatory. Denies any recent falls.   SAFETY ISSUES:  Prior radiation? No  Pacemaker/ICD? No  Possible current pregnancy? N/A  Is the patient on methotrexate? No  Current Complaints / other details: 82 year old male. Accompanied by son and ex wife. States since hospital discharge he is feeling stronger.   I met with this patient earlier (08/20/15) and offered palliative RT for a right proximal femur met from prostate cancer. During our visit, he was interested in proceeding. However, earlier today, he elected not to receive RT at this time. I would be happy to see him again in 4-6 weeks for follow-up to discuss ongoing options later.

## 2015-09-24 ENCOUNTER — Encounter: Payer: Self-pay | Admitting: Radiation Oncology

## 2015-09-24 ENCOUNTER — Ambulatory Visit
Admission: RE | Admit: 2015-09-24 | Discharge: 2015-09-24 | Disposition: A | Discharge status: Home / Self Care | Payer: Medicare Other | Source: Ambulatory Visit | Attending: Radiation Oncology | Admitting: Radiation Oncology

## 2015-09-24 ENCOUNTER — Ambulatory Visit: Payer: Medicare Other

## 2015-09-24 VITALS — BP 143/78 | HR 86 | Resp 16 | Ht 72.0 in | Wt 199.4 lb

## 2015-09-24 DIAGNOSIS — Z79899 Other long term (current) drug therapy: Secondary | ICD-10-CM | POA: Diagnosis not present

## 2015-09-24 DIAGNOSIS — C7951 Secondary malignant neoplasm of bone: Secondary | ICD-10-CM

## 2015-09-24 DIAGNOSIS — Z87891 Personal history of nicotine dependence: Secondary | ICD-10-CM | POA: Diagnosis not present

## 2015-09-24 DIAGNOSIS — Z9889 Other specified postprocedural states: Secondary | ICD-10-CM | POA: Diagnosis not present

## 2015-09-24 DIAGNOSIS — C61 Malignant neoplasm of prostate: Secondary | ICD-10-CM

## 2015-09-24 DIAGNOSIS — N400 Enlarged prostate without lower urinary tract symptoms: Secondary | ICD-10-CM | POA: Diagnosis not present

## 2015-09-24 NOTE — Progress Notes (Signed)
Radiation Oncology         (336) [phone] ________________________________  Name: Gregory [name] MRN: [id number]  Date: 09/24/2015  DOB: [birth year]  Follow-Up Visit Note  CC: Dorena Dew, FNP  Franchot Gallo, MD  Diagnosis: Stage II B adenocarcinoma of the prostate with a Gleason score of 4+5, PSA of 18, with metastatic disease to the axial and appendicular skeleton with particular intensity on bone scan noted in the right proximal femur.    ICD-9-CM ICD-10-CM   1. Prostate cancer ([place]) 185 C61   2. Metastatic adenocarcinoma to bone ([facility]) 198.5 C79.51     Interval Since Last Radiation: N/A  Narrative:  The patient returns today to discuss palliative radiation. He presented for an initial consultation regarding palliative radiation to his right proximal femur for Geason Score 4+5 adenocarcinoma of the prostate on 08/20/15. The patient underwent TURP with orchiectomy on 08/27/15. Pathology of the testes did not reveal malignancy. The patient was scheduled to undergo CT simulation on 08/28/15, but presented to the ED the same day due to the inability to urinate and pain around the surgical site. The patient developed renal failure and was admitted to the hospital. He was discharged a week later. The patient was overwhelmed with his surgery and subsequent hospitalization and was unable to undergo CT simulation. He comes today to reestablish himself in anticipation of radiotherapy to the right femur. Of note he states he has not had any episodes of falls, and denies any current pain to his right femur.  ALLERGIES:  has No Known Allergies.  Meds: Current Outpatient Prescriptions  Medication Sig Dispense Refill  . atenolol (TENORMIN) 50 MG tablet Take 2 tablets (100 mg total) by mouth daily. 30 tablet 0  . Multiple Vitamins-Minerals (MULTIVITAMIN WITH MINERALS) tablet Take 1 tablet by mouth every morning.     . polyethylene glycol (MIRALAX / GLYCOLAX) packet Take 17 g by mouth daily as  needed for mild constipation. (Patient not taking: Reported on 09/24/2015) 14 each 0   No current facility-administered medications for this encounter.    Physical Findings:  height is 6' (1.829 m) and weight is 199 lb 6.4 oz (90.447 kg). His blood pressure is 143/78 and his pulse is 86. His respiration is 16 and oxygen saturation is 100%.   Pain scale 0/10 In general this is a well appearing African-American male in no acute distress. He's alert and oriented x4 and appropriate throughout the examination. Cardiopulmonary assessment is negative for acute distress and he exhibits normal effort.   Lab Findings: Lab Results  Component Value Date   WBC 7.7 09/04/2015   HGB 8.8* 09/04/2015   HCT 27.2* 09/04/2015   PLT 132* 09/04/2015    Lab Results  Component Value Date   NA 146* 09/05/2015   K 3.7 09/05/2015   CO2 27 09/05/2015   GLUCOSE 113* 09/05/2015   BUN 14 09/05/2015   CREATININE 1.38* 09/05/2015   CREATININE 1.17* 07/14/2015   BILITOT 0.8 08/30/2015   ALKPHOS 215* 08/30/2015   AST 35 08/30/2015   ALT 14* 08/30/2015   PROT 5.0* 08/30/2015   ALBUMIN 2.9* 08/31/2015   CALCIUM 8.5* 09/05/2015   ANIONGAP 10 09/05/2015   Lab Results  Component Value Date   PSA 18.74* 04/13/2015    Radiographic Findings: US Renal  08/29/2015  CLINICAL DATA:  Acute kidney injury EXAM: RENAL / URINARY TRACT ULTRASOUND COMPLETE COMPARISON:  CT abdomen 08/12/2015 FINDINGS: Right Kidney: Length: 11.6 cm. 3.8 cm cyst left lower pole as noted  on CT. Echogenicity within normal limits. No mass or hydronephrosis visualized. Left Kidney: Length: 12.1 cm in length. Difficult imaging left kidney due to body habitus. Echogenicity within normal limits. No mass or hydronephrosis visualized. Bladder: Foley catheter.  Echogenic urine.  The patient has hematuria. IMPRESSION: Negative for hydronephrosis.  Right renal cyst Echogenic urine in the bladder compatible with hematuria. Electronically Signed   By: Franchot Gallo M.D.   On: 08/29/2015 11:01   Nm Pulmonary Perf And Vent  09/03/2015  CLINICAL DATA:  Progressive shortness of breath hypoxia for several months. EXAM: NUCLEAR MEDICINE VENTILATION - PERFUSION LUNG SCAN TECHNIQUE: Ventilation images were obtained in multiple projections using inhaled aerosol Tc-44m DTPA. Perfusion images were obtained in multiple projections after intravenous injection of Tc-37m MAA. RADIOPHARMACEUTICALS:  33.0 Technetium-70m DTPA aerosol inhalation and 4.1 Technetium-13m MAA IV COMPARISON:  Chest radiograph on 09/02/2015 FINDINGS: Ventilation: No focal ventilation defect. Chronic elevation of right hemidiaphragm noted. Perfusion: No wedge shaped peripheral perfusion defects to suggest acute pulmonary embolism. IMPRESSION: Normal study.  No evidence of pulmonary embolism. Electronically Signed   By: Earle Gell M.D.   On: 09/03/2015 17:20   Dg Chest Port 1 View  09/02/2015  CLINICAL DATA:  Status post central line placement today. History of metastatic prostate cancer. EXAM: PORTABLE CHEST 1 VIEW COMPARISON:  Single view of the chest 09/01/2015. FINDINGS: A new right IJ catheter is in place with the tip projecting in the mid to lower superior vena cava. There is no pneumothorax. Marked elevation of the right hemidiaphragm and right basilar atelectasis are again seen. The left lung is clear. Heart size is normal. Scattered sclerotic bony lesions consistent with metastatic prostate cancer noted. IMPRESSION: Right IJ catheter projects in good position. Negative for pneumothorax. Subsegmental atelectasis right lung base. Electronically Signed   By: Inge Rise M.D.   On: 09/02/2015 14:30   Dg Chest Port 1 View  09/01/2015  CLINICAL DATA:  82 year old male with shortness of Breath. Initial encounter. Prostate cancer with metastatic disease to bone. EXAM: PORTABLE CHEST 1 VIEW COMPARISON:  Chest radiographs 04/01/2015. CT Abdomen and Pelvis 08/12/2015. FINDINGS: Portable AP semi  upright view at 1003 hours. Stable elevation of the right hemidiaphragm with associated right lung base atelectasis. Mediastinal contours are stable and within normal limits. Visualized tracheal air column is within normal limits. Scattered ribs sclerosis, better demonstrated by CT. Sclerotic vertebral body metastasis are poorly visible radiographically. No pneumothorax, pulmonary edema, pleural effusion or acute pulmonary opacity. IMPRESSION: 1. No acute cardiopulmonary abnormality. 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 2. Widespread osseous metastatic disease, better demonstrated on recent CT Abdomen and Pelvis. Electronically Signed   By: Genevie Ann M.D.   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  Plan:  Dr. Tammi Klippel reviews the rationale for proceeding with palliative radiotherapy to the right femur. The patient is interested in proceeding with this, we reviewed the risks, benefits, short and long-term side affects profile of radiotherapy. He is interested in stating for CT simulation today as we can accommodate this. Dr. Tammi Klippel recommends proceeding with 10 fractions of palliative radiotherapy to a total of 30 Gy. Written consent for CT simulation is obtained. We will plan to begin his radiotherapy next week. He states agreement and understanding.  The above documentation  reflects my direct findings during this shared patient visit. Please see the separate note by Dr. Tammi Klippel on this date for the remainder of the patient's plan of care.    Carola Rhine, PAC  This document serves as a record of services personally performed by Shona Simpson, PAC and Tyler Pita, MD. It was created on their behalf by Darcus Austin, a trained medical scribe. The creation of this record is based  on the scribe's personal observations and the provider's statements to them. This document has been checked and approved by the attending provider.

## 2015-09-24 NOTE — Progress Notes (Signed)
  Radiation Oncology         (336) [phone] ________________________________  Name: Gregory [name] MRN: [id number]  Date: 09/24/2015  DOB: 07-26-1928  SIMULATION AND TREATMENT PLANNING NOTE    ICD-9-CM ICD-10-CM   1. 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  NARRATIVE:  The patient was brought to the [facility].  Identity was confirmed.  All relevant records and images related to the planned course of therapy were reviewed.  The patient freely provided informed written consent to proceed with treatment after reviewing the details related to the planned course of therapy. The consent form was witnessed and verified by the simulation staff.  Then, the patient was set-up in a stable reproducible  supine position for radiation therapy.  CT images were obtained.  Surface markings were placed.  The CT images were loaded into the planning software.  Then the target and avoidance structures were contoured.  Treatment planning then occurred.  The radiation prescription was entered and confirmed.  Then, I designed and supervised the construction of a total of 3 medically necessary complex treatment devices.  I have requested : Isodose Plan including a body fix leg positioner and two MLCs.  PLAN:  The patient will receive 30 Gy in 10 fractions.  ________________________________  Sheral Apley Tammi Klippel, M.D.  This document serves as a record of services personally performed by Tyler Pita, MD. It was created on his behalf by Darcus Austin, a trained medical scribe. The creation of this record is based on the scribe's personal observations and the provider's statements to them. This document has been checked and approved by the attending provider.

## 2015-09-25 DIAGNOSIS — C7951 Secondary malignant neoplasm of bone: Secondary | ICD-10-CM | POA: Diagnosis not present

## 2015-10-01 ENCOUNTER — Ambulatory Visit
Admission: RE | Admit: 2015-10-01 | Discharge: 2015-10-01 | Disposition: A | Discharge status: Home / Self Care | Payer: Medicare Other | Source: Ambulatory Visit | Attending: Radiation Oncology | Admitting: Radiation Oncology

## 2015-10-01 DIAGNOSIS — C7951 Secondary malignant neoplasm of bone: Secondary | ICD-10-CM | POA: Diagnosis not present

## 2015-10-02 ENCOUNTER — Encounter: Payer: Self-pay | Admitting: Radiation Oncology

## 2015-10-02 ENCOUNTER — Ambulatory Visit
Admission: RE | Admit: 2015-10-02 | Discharge: 2015-10-02 | Disposition: A | Discharge status: Home / Self Care | Payer: Medicare Other | Source: Ambulatory Visit | Attending: Radiation Oncology | Admitting: Radiation Oncology

## 2015-10-02 VITALS — BP 153/69 | HR 71 | Temp 97.9°F | Ht 72.0 in | Wt 202.6 lb

## 2015-10-02 DIAGNOSIS — C7951 Secondary malignant neoplasm of bone: Secondary | ICD-10-CM

## 2015-10-02 MED ORDER — SONAFINE EX EMUL
1.0000 "application " | Freq: Once | CUTANEOUS | Status: AC
  Administered 2015-10-02: 1 via TOPICAL
  Filled 2015-10-02: qty 45

## 2015-10-02 NOTE — Progress Notes (Signed)
Gregory [name] is here for his 2nd fraction of radiation to his Right Femoral Neck. He denies pain. He states he "feels great". His appetite is good and he reports no difficulty with mobility.   BP 153/69 mmHg  Pulse 71  Temp(Src) 97.9 F (36.6 C)  Ht 6' (1.829 m)  Wt 202 lb 9.6 oz (91.899 kg)  BMI 27.47 kg/m2

## 2015-10-02 NOTE — Progress Notes (Signed)
Pt here for patient teaching.  Pt given Radiation and You booklet, skin care instructions and Sonafine. Pt reports they have not watched the Radiation Therapy Education video but were given the link to watch at home.  Reviewed areas of pertinence such as fatigue, skin changes, breast swelling, cough, shortness of breath, earaches and taste changes . Pt able to give teach back of to pat skin, use unscented/gentle soap and drink plenty of water,apply Sonafine bid and avoid applying anything to skin within 4 hours of treatment. Pt verbalizes understanding of information given and will contact nursing with any questions or concerns.     [email or web address]

## 2015-10-02 NOTE — Progress Notes (Signed)
   Weekly Management Note:  Outpatient    ICD-9-CM ICD-10-CM   1. Metastatic adenocarcinoma to bone ([facility]) 198.5 C79.51     Current Dose:  6 Gy  Projected Dose: 30 Gy   Narrative:  The patient presents for routine under treatment assessment.  CBCT/MVCT images/Port film x-rays were reviewed.  The chart was checked.   Gregory [name] is here for his 2nd fraction of radiation to his Right Femoral Neck. He denies pain. He states he "feels great". His appetite is good and he reports no difficulty with mobility.   Physical Findings:  height is 6' (1.829 m) and weight is 202 lb 9.6 oz (91.899 kg). His temperature is 97.9 F (36.6 C). His blood pressure is 153/69 and his pulse is 71.   Wt Readings from Last 3 Encounters:  10/02/15 202 lb 9.6 oz (91.899 kg)  09/24/15 199 lb 6.4 oz (90.447 kg)  09/05/15 192 lb 9.6 oz (87.363 kg)   NAD  Impression:  The patient is tolerating radiotherapy.  Plan:  Continue radiotherapy as planned.   ________________________________   Eppie Gibson, M.D.     This document serves as a record of services personally performed by Eppie Gibson, MD. It was created on her behalf by Arlyce Harman, a trained medical scribe. The creation of this record is based on the scribe's personal observations and the provider's statements to them. This document has been checked and approved by the attending provider.

## 2015-10-05 ENCOUNTER — Ambulatory Visit
Admission: RE | Admit: 2015-10-05 | Discharge: 2015-10-05 | Disposition: A | Discharge status: Home / Self Care | Payer: Medicare Other | Source: Ambulatory Visit | Attending: Radiation Oncology | Admitting: Radiation Oncology

## 2015-10-05 DIAGNOSIS — C7951 Secondary malignant neoplasm of bone: Secondary | ICD-10-CM | POA: Diagnosis not present

## 2015-10-06 ENCOUNTER — Ambulatory Visit
Admission: RE | Admit: 2015-10-06 | Discharge: 2015-10-06 | Disposition: A | Discharge status: Home / Self Care | Payer: Medicare Other | Source: Ambulatory Visit | Attending: Radiation Oncology | Admitting: Radiation Oncology

## 2015-10-06 DIAGNOSIS — C7951 Secondary malignant neoplasm of bone: Secondary | ICD-10-CM | POA: Diagnosis not present

## 2015-10-07 ENCOUNTER — Ambulatory Visit
Admission: RE | Admit: 2015-10-07 | Discharge: 2015-10-07 | Disposition: A | Discharge status: Home / Self Care | Payer: Medicare Other | Source: Ambulatory Visit | Attending: Radiation Oncology | Admitting: Radiation Oncology

## 2015-10-07 DIAGNOSIS — C7951 Secondary malignant neoplasm of bone: Secondary | ICD-10-CM | POA: Diagnosis not present

## 2015-10-08 ENCOUNTER — Ambulatory Visit
Admission: RE | Admit: 2015-10-08 | Discharge: 2015-10-08 | Disposition: A | Discharge status: Home / Self Care | Payer: Medicare Other | Source: Ambulatory Visit | Attending: Radiation Oncology | Admitting: Radiation Oncology

## 2015-10-08 ENCOUNTER — Encounter: Payer: Self-pay | Admitting: Radiation Oncology

## 2015-10-08 VITALS — BP 141/82 | HR 71 | Resp 16 | Wt 202.8 lb

## 2015-10-08 DIAGNOSIS — C61 Malignant neoplasm of prostate: Secondary | ICD-10-CM

## 2015-10-08 DIAGNOSIS — C7951 Secondary malignant neoplasm of bone: Secondary | ICD-10-CM | POA: Diagnosis not present

## 2015-10-08 NOTE — Progress Notes (Signed)
Weight and vitals stable. Slow steady gait noted. Denies pain. Reports intermittent right femur pain at night but, denies needing medication for relief. No edema of right left noted. Denies complications with bowel or bladder. Denies fatigue. States, "I am doing extra good with treatments."  BP 141/82 mmHg  Pulse 71  Resp 16  Wt 202 lb 12.8 oz (91.989 kg)  SpO2 100% Wt Readings from Last 3 Encounters:  10/08/15 202 lb 12.8 oz (91.989 kg)  10/02/15 202 lb 9.6 oz (91.899 kg)  09/24/15 199 lb 6.4 oz (90.447 kg)

## 2015-10-08 NOTE — Progress Notes (Signed)
  Radiation Oncology         [phone]   Name: Gregory [name] MRN: [id number]   Date: 10/08/2015  DOB: [birth year]   Weekly Radiation Therapy Management      ICD-9-CM ICD-10-CM   1. Malignant neoplasm prostate ([facility]) 185 C61     Current Dose: 18 Gy  Planned Dose:  30 Gy  Narrative The patient presents for routine under treatment assessment.  Weight and vitals stable. Slow steady gait noted. Denies pain. Reports intermittent right femur pain at night but, denies needing medication for relief. No edema of right left noted. Denies complications with bowel or bladder. Denies fatigue. States, "I am doing extra good with treatments".  The patient is without complaint. Set-up films were reviewed. The chart was checked.  Physical Findings  weight is 202 lb 12.8 oz (91.989 kg). His blood pressure is 141/82 and his pulse is 71. His respiration is 16 and oxygen saturation is 100%. . Weight essentially stable.  No significant changes.  Impression The patient is tolerating radiation.  Plan Continue treatment as planned.         Sheral Apley Tammi Klippel, M.D.    This document serves as a record of services personally performed by Tyler Pita, MD. It was created on his behalf by Lendon Collar, a trained medical scribe. The creation of this record is based on the scribe's personal observations and the provider's statements to them. This document has been checked and approved by the attending provider.

## 2015-10-09 ENCOUNTER — Ambulatory Visit
Admission: RE | Admit: 2015-10-09 | Discharge: 2015-10-09 | Disposition: A | Discharge status: Home / Self Care | Payer: Medicare Other | Source: Ambulatory Visit | Attending: Radiation Oncology | Admitting: Radiation Oncology

## 2015-10-09 DIAGNOSIS — C7951 Secondary malignant neoplasm of bone: Secondary | ICD-10-CM | POA: Diagnosis not present

## 2015-10-12 ENCOUNTER — Ambulatory Visit: Payer: Medicare Other | Admitting: Family Medicine

## 2015-10-12 ENCOUNTER — Ambulatory Visit
Admission: RE | Admit: 2015-10-12 | Discharge: 2015-10-12 | Disposition: A | Discharge status: Home / Self Care | Payer: Medicare Other | Source: Ambulatory Visit | Attending: Radiation Oncology | Admitting: Radiation Oncology

## 2015-10-12 DIAGNOSIS — C7951 Secondary malignant neoplasm of bone: Secondary | ICD-10-CM | POA: Diagnosis not present

## 2015-10-13 ENCOUNTER — Ambulatory Visit (HOSPITAL_COMMUNITY): Payer: Self-pay | Admitting: Dentistry

## 2015-10-13 ENCOUNTER — Ambulatory Visit
Admission: RE | Admit: 2015-10-13 | Discharge: 2015-10-13 | Disposition: A | Discharge status: Home / Self Care | Payer: Medicare Other | Source: Ambulatory Visit | Attending: Radiation Oncology | Admitting: Radiation Oncology

## 2015-10-13 ENCOUNTER — Encounter (HOSPITAL_COMMUNITY): Payer: Self-pay | Admitting: Dentistry

## 2015-10-13 VITALS — BP 164/64 | HR 64 | Temp 97.6°F

## 2015-10-13 DIAGNOSIS — K0850 Unsatisfactory restoration of tooth, unspecified: Secondary | ICD-10-CM

## 2015-10-13 DIAGNOSIS — K08109 Complete loss of teeth, unspecified cause, unspecified class: Secondary | ICD-10-CM

## 2015-10-13 DIAGNOSIS — K0820 Unspecified atrophy of edentulous alveolar ridge: Secondary | ICD-10-CM

## 2015-10-13 DIAGNOSIS — Z01818 Encounter for other preprocedural examination: Secondary | ICD-10-CM | POA: Diagnosis not present

## 2015-10-13 DIAGNOSIS — C61 Malignant neoplasm of prostate: Secondary | ICD-10-CM

## 2015-10-13 DIAGNOSIS — C7951 Secondary malignant neoplasm of bone: Secondary | ICD-10-CM | POA: Diagnosis not present

## 2015-10-13 DIAGNOSIS — Z972 Presence of dental prosthetic device (complete) (partial): Secondary | ICD-10-CM
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
## 2015-10-14 ENCOUNTER — Encounter: Payer: Self-pay | Admitting: Radiation Oncology

## 2015-10-14 ENCOUNTER — Ambulatory Visit
Admission: RE | Admit: 2015-10-14 | Discharge: 2015-10-14 | Disposition: A | Discharge status: Home / Self Care | Payer: Medicare Other | Source: Ambulatory Visit | Attending: Radiation Oncology | Admitting: Radiation Oncology

## 2015-10-14 DIAGNOSIS — C7951 Secondary malignant neoplasm of bone: Secondary | ICD-10-CM | POA: Diagnosis not present

## 2015-10-14 NOTE — Progress Notes (Signed)
1355. Patient completed radiation therapy today. Patient without needs or complaints. No distress noted. Steady gait noted. Denies pain. No edema of bilateral extremities noted. Denies bowel or bladder complications. Denies fatigue. One month follow up appointment card given. Understands to contact this RN with future needs.

## 2015-10-14 NOTE — Patient Instructions (Signed)
Upper and lower dentures were sent to the prosthodontics lab for replacement and repair of denture tooth numbers 8 and 9. The prosthodontics lab will have to order the new teeth and patient will return on Monday, 10/19/2015 at 2 PM for reinsertion of the denture repair. Patient is currently cleared for [facility] therapy. Lenn Cal, DDS

## 2015-10-15 ENCOUNTER — Encounter (HOSPITAL_COMMUNITY): Payer: Self-pay | Admitting: Dentistry

## 2015-10-19 ENCOUNTER — Ambulatory Visit (HOSPITAL_COMMUNITY): Payer: Self-pay | Admitting: Dentistry

## 2015-10-19 VITALS — BP 182/79 | HR 64 | Temp 97.6°F

## 2015-10-19 DIAGNOSIS — C7951 Secondary malignant neoplasm of bone: Secondary | ICD-10-CM

## 2015-10-19 DIAGNOSIS — K0850 Unsatisfactory restoration of tooth, unspecified: Secondary | ICD-10-CM

## 2015-10-19 DIAGNOSIS — K0820 Unspecified atrophy of edentulous alveolar ridge: Secondary | ICD-10-CM

## 2015-10-19 DIAGNOSIS — Z463 Encounter for fitting and adjustment of dental prosthetic device: Secondary | ICD-10-CM

## 2015-10-19 DIAGNOSIS — K08109 Complete loss of teeth, unspecified cause, unspecified class: Secondary | ICD-10-CM

## 2015-10-19 DIAGNOSIS — Z01818 Encounter for other preprocedural examination: Secondary | ICD-10-CM

## 2015-10-19 DIAGNOSIS — K0859 Other unsatisfactory restoration of tooth: Secondary | ICD-10-CM

## 2015-10-19 DIAGNOSIS — Z972 Presence of dental prosthetic device (complete) (partial): Secondary | ICD-10-CM

## 2015-10-19 DIAGNOSIS — C61 Malignant neoplasm of prostate: Secondary | ICD-10-CM

## 2015-10-19 NOTE — Progress Notes (Signed)
10/19/2015  Patient Name:   JERAMINE LOUKS Date of Birth:   [birth year] Medical Record Number: [id number]  BP 182/79 mmHg  Pulse 64  Temp(Src) 97.6 F (36.4 C) (Oral)  CHING ZAYAS presents for insertion of upper complete denture repair.  Procedure: Pressure indicating paste was applied to the dentures. Adjustments were made as needed. [place] Occlusion evaluated and adjustments made as needed for Centric Relation and protrusive strokes. Good esthetics, phonetics, fit, and function noted. Patient accepts results. Post op instructions provided in written and verbal formats on use and care of dentures. Gave patient denture cup. Patient to keep dentures out if sore spots develop. Use salt water rinses as needed to aid healing. Call if problems arise. Recommend obtaining new primary dentist for denture evaluations.   Lenn Cal, DDS

## 2015-10-19 NOTE — Patient Instructions (Signed)
Instructions for Denture Use and Care  Congratulations, you are on the way to oral rehabilitation!  You have just received a new set of complete or partial dentures.  These prostheses will help to improve both your appearance and chewing ability.  These instructions will help you get adjusted to your dentures as well as care for them properly.  Please read these instructions carefully and completely as soon as you get home.  If you or your caregiver have any questions please notify the [facility] at [phone].  HOW YOUR DENTURES LOOK AND FEEL Soon after you begin wearing your dentures, you may feel that your dentures are too large or even loose.  As our mouth and facial muscles become accustomed to the dentures, these feelings will go away.  You also may feel that you are salivating more than you normally do.  This feeling should go away as you get used to having the dentures in your mouth.  You may bite your cheek or your tongue; this will eventually resolve itself as you wear your dentures.  Some soreness is to be expected, but you should not hurt.  If your mouth hurts, call your dentist.  A denture adhesive may occasionally be necessary to hold your dentures in place more securely.  The dentist will let you know when one is recommended for you.  SPEAKING Wearing dentures will change the sound of your voice initially.  This will be noticed by you more than anyone else.  Bite and swallow before you speak, in order to place your dentures in position so that you may speak more clearly.  Practice speaking by reading aloud or counting from 1 to 100 very slowly and distinctly.  After some practice your mouth will become accustomed to your dentures and you will speak more clearly.  EATING Chewing will definitely be different after you receive your dentures.  With a little practice and patience you should be able to eat just about any kind of food.  Begin by eating small quantities of food  that are cut into small pieces.  Star with soft foods such as eggs, cooked vegetables, or puddings.  As you gain confidence advance  Your diet to whatever texture foods you can tolerate.  DENTURE CARE Dentures can collect plaque and calculus much the same as natural teeth can.  If not removed on a regular basis, your dentures will not look or feel clean, and you will experience denture odor.  It is very important that you remove your dentures at bedtime and clean them thoroughly.  You should: 1. Clean your dentures over a sink full of water so if dropped, breakage will be prevented. 2. Rinse your dentures with cool water to remove any large food particles. 3. Use soap and water or a denture cleanser or paste to clean the dentures.  Do not use regular toothpaste as it may abrade the denture base or teeth. 4. Use a moistened denture brush to clean all surfaces (inside and outside). 5. Rinse thoroughly to remove any remaining soap or denture cleanser. 6. Use a soft bristle toothbrush to gently brush any natural teeth, gums, tongue, and palate at bedtime and before reinserting your dentures. 7. Do not sleep with your dentures in your mouth at night.  Remove your dentures and soak them overnight in a denture cup filled with water or denture solution as recommended by your dentist.  This routine will become second nature and will increase the life and comfort   of your dentures.  Please do not try to adjust these dentures yourself; you could damage them.  FOLLOW-UP You should call or make an appointment with your dentist.  Your dentist would like to see you at least once a year for a check-up and examination. 

## 2015-10-30 NOTE — Progress Notes (Signed)
  Radiation Oncology         [phone] ________________________________  Name: Gregory [name] MRN: [id number]  Date: 10/14/2015  DOB: 09-Nov-1928  End of Treatment Note   ICD-9-CM ICD-10-CM    1. Metastatic adenocarcinoma to bone (HCC) 198.5 C79.51     DIAGNOSIS: Stage IV adenocarcinoma of the prostate with a Gleason score of 4+5, PSA of 18, with metastatic disease to the right proximal femur.     Indication for treatment:  Palliative       Radiation treatment dates:  10/01/2015-10/14/2015  Site/dose:   The right femoral neck was treated to 30 Gy in 10 fractions of 3 Gy per fraction.   Beams/energy:   AP/PA / 10X  Narrative: The patient tolerated radiation treatment relatively well.  The patient reported intermittent right femur pain at night but denied needing medication for relief.   Plan: The patient has completed radiation treatment. The patient will return to radiation oncology clinic for routine followup in one month. I advised him to call or return sooner if he has any questions or concerns related to his recovery or treatment. ________________________________  Sheral Apley. Tammi Klippel, M.D.  This document serves as a record of services personally performed by Tyler Pita, MD. It was created on his behalf by Arlyce Harman, a trained medical scribe. The creation of this record is based on the scribe's personal observations and the provider's statements to them. This document has been checked and approved by the attending provider.

## 2015-11-26 ENCOUNTER — Ambulatory Visit: Payer: Medicare Other | Admitting: Radiation Oncology

## 2015-12-02 ENCOUNTER — Ambulatory Visit
Admission: RE | Admit: 2015-12-02 | Discharge: 2015-12-02 | Disposition: A | Discharge status: Home / Self Care | Payer: Medicare Other | Source: Ambulatory Visit | Attending: Radiation Oncology | Admitting: Radiation Oncology

## 2015-12-02 ENCOUNTER — Encounter: Payer: Self-pay | Admitting: Radiation Oncology

## 2015-12-02 VITALS — BP 159/85 | HR 84 | Temp 97.9°F | Ht 72.0 in | Wt 206.9 lb

## 2015-12-02 DIAGNOSIS — C7951 Secondary malignant neoplasm of bone: Secondary | ICD-10-CM | POA: Diagnosis not present

## 2015-12-02 DIAGNOSIS — C61 Malignant neoplasm of prostate: Secondary | ICD-10-CM | POA: Diagnosis present

## 2015-12-02 NOTE — Progress Notes (Signed)
Radiation Oncology         [phone] ________________________________  Name: Gregory [name] MRN: [id number]  Date: 12/02/2015  DOB: 13-Apr-1929  Follow-Up Visit Note  CC: Dorena Dew, FNP  Franchot Gallo, MD  Diagnosis:   Stage IV, High risk, T2b adenocarcinoma of the prostate, Gleason score 4+5 and a PSA of 18    ICD-9-CM ICD-10-CM   1. Malignant neoplasm prostate (HCC) 185 C61     Interval Since Last Radiation:  7  weeks   10/01/2015-10/14/2015: The right femoral neck was treated to 30 Gy in 10 fractions of 3 Gy per fraction.   Narrative:  The patient returns today for routine follow-up. During the course of treatment, the patient experienced some intermittent discomfort and pain in the right femur at nighttime, though declined this.  On review of systems, the patient reports that he is doing very well. He denies any chest pain, shortness of breath, fevers, chills, unintended weight changes, new musculoskeletal aches or pains. He denies any hip pain, but at times notes a shooting sensation in his right distal thigh. He denies that this is painful but get's his attention. He denies any bowel or bladder dysfunction. A complete review of systems is obtained and is otherwise negative.                       ALLERGIES:  has No Known Allergies.  Meds: Current Outpatient Prescriptions  Medication Sig Dispense Refill  . atenolol (TENORMIN) 100 MG tablet Take 100 mg by mouth daily.    . Multiple Vitamins-Minerals (MULTIVITAMIN WITH MINERALS) tablet Take 1 tablet by mouth every morning.     . polyethylene glycol (MIRALAX / GLYCOLAX) packet Take 17 g by mouth daily as needed for mild constipation. 14 each 0   No current facility-administered medications for this encounter.    Physical Findings:  height is 6' (1.829 m) and weight is 206 lb 14.4 oz (93.849 kg). His temperature is 97.9 F (36.6 C). His blood pressure is 159/85 and his pulse is 84.  In general this is a well  appearing African-American male in no acute distress. He's alert and oriented x4 and appropriate throughout the examination. Cardiopulmonary assessment is negative for acute distress and he exhibits normal effort. He walks without gait disturbance and has full range of motion of the right hip and thigh.     Lab Findings: Lab Results  Component Value Date   WBC 7.7 09/04/2015   HGB 8.8* 09/04/2015   HCT 27.2* 09/04/2015   PLT 132* 09/04/2015    Lab Results  Component Value Date   NA 146* 09/05/2015   K 3.7 09/05/2015   CO2 27 09/05/2015   GLUCOSE 113* 09/05/2015   BUN 14 09/05/2015   CREATININE 1.38* 09/05/2015   CREATININE 1.17* 07/14/2015   BILITOT 0.8 08/30/2015   ALKPHOS 215* 08/30/2015   AST 35 08/30/2015   ALT 14* 08/30/2015   PROT 5.0* 08/30/2015   ALBUMIN 2.9* 08/31/2015   CALCIUM 8.5* 09/05/2015   ANIONGAP 10 09/05/2015    Radiographic Findings: No results found.  Impression/Plan: 1. Stage IV, T2b adenocarcinoma of the prostate with metastatic disease to the right proximal femur. The patient has tolerated his radiotherapy well and has noticed resolution of his pain. He has a follow up appointment with Dr. Mar Daring today. We will be available for follow up on an as needed basis, but would be happy to see the patient in the future if he  has additional needs for radiotherapy.      Carola Rhine, PAC

## 2015-12-02 NOTE — Progress Notes (Signed)
Gregory [name] Here for reassessment s/p XRT to the right femoral neck. Denies any pain.  Taking unknown Calcium tablet.

## 2015-12-10 NOTE — Addendum Note (Signed)
Encounter addended by: Benn Moulder, RN on: 12/10/2015 11:12 AM<BR>     Documentation filed: Charges VN

## 2015-12-14 ENCOUNTER — Other Ambulatory Visit: Payer: Self-pay | Admitting: Family Medicine

## 2016-01-15 ENCOUNTER — Other Ambulatory Visit: Payer: Self-pay | Admitting: Urology

## 2016-01-15 DIAGNOSIS — C61 Malignant neoplasm of prostate: Secondary | ICD-10-CM

## 2016-02-01 ENCOUNTER — Encounter (HOSPITAL_COMMUNITY): Payer: Medicare Other

## 2016-02-01 ENCOUNTER — Encounter (HOSPITAL_COMMUNITY)
Admission: RE | Admit: 2016-02-01 | Discharge: 2016-02-01 | Disposition: A | Discharge status: Home / Self Care | Payer: Medicare Other | Source: Ambulatory Visit | Attending: Urology | Admitting: Urology

## 2016-02-01 DIAGNOSIS — C61 Malignant neoplasm of prostate: Secondary | ICD-10-CM | POA: Insufficient documentation

## 2016-02-01 MED ORDER — TECHNETIUM TC 99M MEDRONATE IV KIT
25.3000 | PACK | Freq: Once | INTRAVENOUS | Status: AC | PRN
  Administered 2016-02-01: 25.3 via INTRAVENOUS

## 2016-02-17 ENCOUNTER — Telehealth: Payer: Self-pay | Admitting: Radiation Oncology

## 2016-02-17 NOTE — Telephone Encounter (Signed)
Faxed complete and signed Application for Patient Assistance/Xofigo. Confirmation fax of delivery obtained.

## 2016-02-19 ENCOUNTER — Encounter: Payer: Self-pay | Admitting: Radiation Oncology

## 2016-02-19 NOTE — Progress Notes (Addendum)
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 seminal vesicle invasion, perineural invasion, and extraprostatic extension. Now PSA is trending upward. Returns for consideration of Xofigo.  Sites of Visceral and Bony Metastatic Disease: "Diffuse osseous metastatic disease of bilateral Humeri, cervical thoracic and lumbar veterbral bodies, ribs, sternum, pelvis and femurs bilaterally with significant involvementof the right proximal femur  Past/Anticipated chemotherapy by medical oncology, if any: denosumab held by Dahlstedt and replaced with alendronate due to cost  Pain on a scale of 0-10 is:   If Spine Met(s), symptoms, if any, include:  Bowel/Bladder retention or incontinence : Hx of urinary retention. TURP done. Reports urinary urgency and incontinence x 2 weeks, nocturia x 3, reports he no longer takes proscar  Numbness or weakness in extremities (please describe): None per patient account. No edema of lower extremities noted.   Current Decadron regimen, if applicable: no  Ambulatory status? Walker? Wheelchair?: Ambulatory. Denies any recent falls.   Denies pain at this time even during ambulation  SAFETY ISSUES:  Prior radiation? Yes palliative xrt to right proximal femur 10/01/15 -10/14/15  Pacemaker/ICD? No  Possible current pregnancy? N/A  Is the patient on methotrexate? No  Current Complaints / other details: 82 year old male.   Continues Lupron - last dose on last week. Also taking Degarilix  ? xofigo

## 2016-02-22 ENCOUNTER — Ambulatory Visit
Admission: RE | Admit: 2016-02-22 | Discharge: 2016-02-22 | Disposition: A | Discharge status: Home / Self Care | Payer: Medicare Other | Source: Ambulatory Visit | Attending: Radiation Oncology | Admitting: Radiation Oncology

## 2016-02-22 ENCOUNTER — Encounter: Payer: Self-pay | Admitting: Radiation Oncology

## 2016-02-22 VITALS — BP 155/75 | HR 75 | Temp 98.3°F | Ht 72.0 in | Wt 210.0 lb

## 2016-02-22 DIAGNOSIS — N400 Enlarged prostate without lower urinary tract symptoms: Secondary | ICD-10-CM | POA: Diagnosis not present

## 2016-02-22 DIAGNOSIS — K219 Gastro-esophageal reflux disease without esophagitis: Secondary | ICD-10-CM | POA: Diagnosis not present

## 2016-02-22 DIAGNOSIS — C61 Malignant neoplasm of prostate: Secondary | ICD-10-CM

## 2016-02-22 DIAGNOSIS — Z87891 Personal history of nicotine dependence: Secondary | ICD-10-CM | POA: Diagnosis not present

## 2016-02-22 DIAGNOSIS — Z8546 Personal history of malignant neoplasm of prostate: Secondary | ICD-10-CM | POA: Insufficient documentation

## 2016-02-22 DIAGNOSIS — C7951 Secondary malignant neoplasm of bone: Secondary | ICD-10-CM | POA: Diagnosis not present

## 2016-02-22 DIAGNOSIS — I10 Essential (primary) hypertension: Secondary | ICD-10-CM | POA: Insufficient documentation

## 2016-02-22 NOTE — Progress Notes (Signed)
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    ICD-9-CM ICD-10-CM   1. 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 male seen at the request of Dr. Romilda Garret with a  prostate cancer with multiple skeletal metastases. The patient has a history of benign prostatic hypertrophy, and has been on Finesteride and Tamsulosin for many years. He lived in [place] for many years but relocated to [place] in the past 5 years. He recalls having a PSA that was "borderline" in about 2010, but did not have repeat levels drawn until relocating to [place]. His first since about 2010 was performed by his PCP in October 2016 at which time his PSA was 18.74. He underwent a prostatic biopsy on 07/27/2015, and 12 of the 12 specimens contained adenocarcinoma with a Gleason Score of 4+5 with extraprostatic extension and perinural invasion. Apparently he has had  urinary retention and has an indwelling Foley catheter for the last 3-4 weeks after presenting to the ED with retention.  A CT scan of the abdomen and pelvis did not reveal any visceral metastases when performed on 08/12/2015. His bone scan however reveals diffuse osseous metastatic disease involving the axial and appendicular skeleton most intense uptake at the proximal right femur. Other sites includes both humerus, sternum, ribs, spine, pelvis and proximal femur. He was started on Casodex by Dr. Romilda Garret, and underwent bilateral orchiectomy and TURP in February 2017.   He received palliative radiotherapy to his right femur due to pain which he completed in April 2017. He has been on Casodex and Lupron and his  PSA of 2.32 on 11/25/15, and was 2.94 on 02/08/16. 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  PREVIOUS RADIATION THERAPY: Yes  10/01/2015-10/14/2015: The right femoral neck was treated to 30 Gy in 10 fractions of 3 Gy per fraction.   PAST MEDICAL HISTORY:   Past Medical History:  Diagnosis Date  . Bone metastases ([place])   . BPH (benign prostatic hyperplasia)   . Foley catheter in place    Urinary Retension  . GERD (gastroesophageal reflux disease)   . Hypertension   . Prostate cancer [facility])   urologist- dr dahlstedt/  oncologist-  dr manning    Gleason 4+5,  PSA 18.74,  w/  Visceral and Extensive Bones METS--  pallitive radiation therapy and hormone therapy     PAST SURGICAL HISTORY: Past Surgical History:  Procedure Laterality Date  . MULTIPLE TOOTH EXTRACTIONS  1980's  . ORCHIECTOMY Bilateral 08/27/2015   Procedure: ORCHIECTOMY;  Surgeon: Gregory Gallo, MD;  Location: [facility];  Service: Urology;  Laterality: Bilateral;  . PROSTATE BIOPSY  1/16 /2017  . TRANSURETHRAL RESECTION OF PROSTATE N/A 08/27/2015   Procedure: TRANSURETHRAL RESECTION OF THE PROSTATE WITH GYRUS INSTRUMENTS;  Surgeon: Gregory Gallo, MD;  Location: [facility];  Service: Urology;  Laterality: N/A;    FAMILY HISTORY: family history includes Other (age of onset: 43) in his father; Tuberculosis (age of onset: 38) in his mother.  SOCIAL HISTORY:  Social History   Social History  . Marital status: Divorced    Spouse name: N/A  .  Number of children: N/A  . Years of education: N/A   Occupational History  . Retired    Social History Main Topics  . Smoking status: Former Smoker    Packs/day: 0.25    Years: 30.00    Types: Cigarettes    Start date: 07/11/1992  . Smokeless tobacco: Never Used  . Alcohol use No  . Drug use: No  . Sexual activity: No   Other Topics Concern  . Not on file   Social History Narrative  . No  narrative on file    ALLERGIES: Review of patient's allergies indicates no known allergies.  MEDICATIONS:  Current Outpatient Prescriptions  Medication Sig Dispense Refill  . atenolol (TENORMIN) 100 MG tablet Take 100 mg by mouth daily.    . Multiple Vitamins-Minerals (MULTIVITAMIN WITH MINERALS) tablet Take 1 tablet by mouth every morning.     . polyethylene glycol (MIRALAX / GLYCOLAX) packet Take 17 g by mouth daily as needed for mild constipation. 14 each 0   No current facility-administered medications for this encounter.     REVIEW OF SYSTEMS: On review of systems, the patient reports that he is doing well overall. He denies any chest pain, shortness of breath, cough, fevers, chills, night sweats, unintended weight changes. He denies any bowel or bladder disturbances, and denies abdominal pain, nausea or vomiting. His right femur pain has resolved, and he denies any new musculoskeletal pain.  A complete review of systems is obtained and is otherwise negative.    PHYSICAL EXAM:  height is 6' (1.829 m) and weight is 210 lb (95.3 kg). His temperature is 98.3 F (36.8 C). His blood pressure is 155/75 (abnormal) and his pulse is 75.   Pain Scale 0/10  In general this is a well appearing African-American male in no acute distress. He is alert and oriented x4 and appropriate throughout the examination. HEENT reveals that the patient is normocephalic, atraumatic. EOMs are intact. PERRLA. Skin is intact without any evidence of gross lesions. Cardiovascular exam reveals a regular rate and rhythm, no clicks rubs or murmurs are auscultated. Chest is clear to auscultation bilaterally. Lymphatic assessment is performed and does not reveal any adenopathy in the cervical, supraclavicular, axillary, or inguinal chains. Abdomen has active bowel sounds in all quadrants and is intact. The abdomen is soft, non tender, non distended. Lower extremities are negative for pretibial pitting edema, deep calf  tenderness, cyanosis or clubbing.   KPS = 100  100 - Normal; no complaints; no evidence of disease. 90   - Able to carry on normal activity; minor signs or symptoms of disease. 80   - Normal activity with effort; some signs or symptoms of disease. 39   - Cares for self; unable to carry on normal activity or to do active work. 60   - Requires occasional assistance, but is able to care for most of his personal needs. 50   - Requires considerable assistance and frequent medical care. 67   - Disabled; requires special care and assistance. 52   - Severely disabled; hospital admission is indicated although death not imminent. 2   - Very sick; hospital admission necessary; active supportive treatment necessary. 10   - Moribund; fatal processes progressing rapidly. 0     - Dead  Karnofsky DA, Abelmann WH, Craver LS and Burchenal [facility] [phone]) The use of the nitrogen mustards in the palliative treatment of carcinoma: with particular reference to bronchogenic carcinoma Cancer 1 634-56  LABORATORY DATA:  Lab Results  Component Value Date   WBC 7.7 09/04/2015   HGB 8.8 (L) 09/04/2015   HCT 27.2 (L) 09/04/2015   MCV 92.2 09/04/2015   PLT 132 (L) 09/04/2015   Lab Results  Component Value Date   NA 146 (H) 09/05/2015   K 3.7 09/05/2015   CL 109 09/05/2015   CO2 27 09/05/2015   Lab Results  Component Value Date   ALT 14 (L) 08/30/2015   AST 35 08/30/2015   ALKPHOS 215 (H) 08/30/2015   BILITOT 0.8 08/30/2015     RADIOGRAPHY: Nm Bone Scan Whole Body  Result Date: 02/01/2016 CLINICAL DATA:  History of prostate cancer. PSA =2.32. Patient does not report bone pain. EXAM: NUCLEAR MEDICINE WHOLE BODY BONE SCAN TECHNIQUE: Whole body anterior and posterior images were obtained approximately 3 hours after intravenous injection of radiopharmaceutical. RADIOPHARMACEUTICALS:  25.3 mCi Technetium-12m MDP IV COMPARISON:  Whole-body scintigraphy 08/12/2015 FINDINGS: There is stable appearance of numerous  foci of abnormal radiotracer uptake within the axial and appendicular skeleton, mainly within the spine, pelvis, sternum, proximal femora, humeri and multiple bilateral ribs. The findings are consistent with widespread skeletal metastatic disease, which is relatively stable when compared to the most recent bone scintigraphy. Increased uptake within the pelvis may correspond to an abnormally enlarged prostate gland. IMPRESSION: Stable appearance of diffuse osseous metastatic disease involving the axial and appendicular skeleton. The most intense activity is again seen within the right femoral head and neck. Electronically Signed   By: Fidela Salisbury M.D.   On: 02/01/2016 13:23     IMPRESSION/PLAN:  1. Stage II B adenocarcinoma of the prostate with a Gleason score of 4+5, PSA of 18, with metastatic disease to the axial and appendicular skeleton. The patient completed radiotherapy for disease in his right proximal femur. Dr. Tammi Klippel discusses the findings from his most rent PSA levels, and given that his disease appears to be castrate resistant, he would recommend proceeding with Xofigo. He discusses the risks, benefits, short, and long term effects of therapy. The patient is interested in receiving this. We will coordinate drug assistance, and move forward once we receive approval.  The above documentation reflects my direct findings during this shared patient visit. Please see the separate note by Dr. Tammi Klippel on this date for the remainder of the patient's plan of care.     Carola Rhine, PAC

## 2016-04-03 ENCOUNTER — Other Ambulatory Visit: Payer: Self-pay | Admitting: Family Medicine

## 2016-04-04 ENCOUNTER — Other Ambulatory Visit: Payer: Self-pay | Admitting: Family Medicine

## 2016-04-05 ENCOUNTER — Other Ambulatory Visit: Payer: Self-pay | Admitting: Family Medicine

## 2016-04-05 NOTE — Addendum Note (Signed)
Encounter addended by: Tyler Pita, MD on: 04/05/2016  3:34 PM<BR>    Actions taken: Visit Navigator Flowsheet section accepted

## 2016-04-06 ENCOUNTER — Other Ambulatory Visit: Payer: Self-pay | Admitting: Radiation Oncology

## 2016-04-06 ENCOUNTER — Telehealth: Payer: Self-pay | Admitting: *Deleted

## 2016-04-06 DIAGNOSIS — C7951 Secondary malignant neoplasm of bone: Principal | ICD-10-CM

## 2016-04-06 DIAGNOSIS — C61 Malignant neoplasm of prostate: Secondary | ICD-10-CM

## 2016-04-06 NOTE — Telephone Encounter (Signed)
CALLED PATIENT TO INFORM OF LAB AND WEIGHT ON 04-08-16 @ 12 PM, AND HIS XOFIGO INJ. ON 04-15-16 @ 2:30 PM @ [facility], SPOKE WITH PATIENT'S DAUGHTER - PAULETTE AND SHE IS AWARE OF THESE APPTS.

## 2016-04-08 ENCOUNTER — Ambulatory Visit
Admission: RE | Admit: 2016-04-08 | Discharge: 2016-04-08 | Disposition: A | Discharge status: Home / Self Care | Payer: Medicare Other | Source: Ambulatory Visit | Attending: Radiation Oncology | Admitting: Radiation Oncology

## 2016-04-08 DIAGNOSIS — C61 Malignant neoplasm of prostate: Secondary | ICD-10-CM

## 2016-04-08 DIAGNOSIS — C7951 Secondary malignant neoplasm of bone: Secondary | ICD-10-CM

## 2016-04-08 LAB — CBC WITH DIFFERENTIAL/PLATELET
BASO%: 0.2 % (ref 0.0–2.0)
Basophils Absolute: 0 10*3/uL (ref 0.0–0.1)
EOS ABS: 0.4 10*3/uL (ref 0.0–0.5)
EOS%: 4.4 % (ref 0.0–7.0)
HCT: 40.1 % (ref 38.4–49.9)
HGB: 13.5 g/dL (ref 13.0–17.1)
LYMPH#: 2.3 10*3/uL (ref 0.9–3.3)
LYMPH%: 28.1 % (ref 14.0–49.0)
MCH: 29.7 pg (ref 27.2–33.4)
MCHC: 33.7 g/dL (ref 32.0–36.0)
MCV: 88.1 fL (ref 79.3–98.0)
MONO#: 0.6 10*3/uL (ref 0.1–0.9)
MONO%: 7.4 % (ref 0.0–14.0)
NEUT%: 59.9 % (ref 39.0–75.0)
NEUTROS ABS: 4.9 10*3/uL (ref 1.5–6.5)
NRBC: 0 % (ref 0–0)
Platelets: 209 10*3/uL (ref 140–400)
RBC: 4.55 10*6/uL (ref 4.20–5.82)
RDW: 14.2 % (ref 11.0–14.6)
WBC: 8.2 10*3/uL (ref 4.0–10.3)

## 2016-04-14 ENCOUNTER — Telehealth: Payer: Self-pay | Admitting: *Deleted

## 2016-04-14 NOTE — Telephone Encounter (Signed)
CALLED PATIENT TO REMIND OF XOFIGO INJ. ON 04-15-16- ARRIVAL TIME - 2:15 PM @ [facility], SPOKE WITH PATIENT AND HE IS AWARE OF THIS INJ.

## 2016-04-15 ENCOUNTER — Encounter (HOSPITAL_COMMUNITY)
Admission: RE | Admit: 2016-04-15 | Discharge: 2016-04-15 | Disposition: A | Discharge status: Home / Self Care | Payer: Medicare Other | Source: Ambulatory Visit | Attending: Radiation Oncology | Admitting: Radiation Oncology

## 2016-04-15 DIAGNOSIS — C61 Malignant neoplasm of prostate: Secondary | ICD-10-CM | POA: Insufficient documentation

## 2016-04-15 DIAGNOSIS — C7951 Secondary malignant neoplasm of bone: Secondary | ICD-10-CM | POA: Diagnosis present

## 2016-04-15 NOTE — Progress Notes (Signed)
  Radiation Oncology         [phone] ________________________________  Name: Gregory [name] MRN: [id number]  Date: 04/15/2016  DOB: 14-Jul-1928  Radium-223 Infusion Note  Diagnosis:  Castration resistant prostate cancer with painful bone involvement  Current Infusion:    1  Planned Infusions:  6  Narrative: Mr. [name] presented to nuclear medicine for treatment. His most recent blood counts were reviewed.  He remains a good candidate to proceed with Ra-223.  The patient was situated in an infusion suite with a contact barrier placed under his arm. Intravenous access was established, using sterile technique, and a normal saline infusion from a syringe was started.  Micro-dosimetry:  The prescribed radiation activity was assayed and confirmed to be within specified tolerance.  Special Treatment Procedure - Infusion:  The nuclear medicine technologist and I personally verified the dose activity to be delivered as specified in the written directive, and verified the patient identification via 2 separate methods.  The syringe containing the dose was attached to a 3 way stopcock, and then the valve was opened to the patient, and the dose delivered over a minute. No complications were noted.  The total administered dose was 143.1 microcuries in a volume of 6 cc.   A saline flush of the line and the syringe that contained the isotope was then performed.  The residual radioactivity in the syringe was 4.0 microcuries, so the actual infused isotope activity was 139.1 microcuries.   Pressure was applied to the venipuncture site, and a compression bandage placed.   Radiation Safety personnel were present to perform the discharge survey, as detailed on their documentation.   After a short period of observation, the patient had his IV removed.  Impression:  The patient tolerated his infusion relatively well.  Plan:  The patient will return in one month for ongoing care.      ________________________________  Sheral Apley. Tammi Klippel, M.D.  This document serves as a record of services personally performed by Tyler Pita, MD. It was created on his behalf by Darcus Austin, a trained medical scribe. The creation of this record is based on the scribe's personal observations and the provider's statements to them. This document has been checked and approved by the attending provider.

## 2016-04-18 ENCOUNTER — Telehealth: Payer: Self-pay | Admitting: *Deleted

## 2016-04-18 ENCOUNTER — Other Ambulatory Visit: Payer: Self-pay | Admitting: Radiation Oncology

## 2016-04-18 DIAGNOSIS — C7951 Secondary malignant neoplasm of bone: Principal | ICD-10-CM

## 2016-04-18 DIAGNOSIS — C61 Malignant neoplasm of prostate: Secondary | ICD-10-CM

## 2016-04-18 NOTE — Telephone Encounter (Signed)
CALLED PATIENT TO INFORM OF APPT. FOR LAB AND WEIGHT ON 05-12-16@ 12:30 PM AND HIS XOFIGO ON 05-19-16 @ 1 PM IN [facility], SPOKE WITH PATIENT AND HE IS AWARE OF THESE APPTS.

## 2016-05-11 ENCOUNTER — Telehealth: Payer: Self-pay | Admitting: *Deleted

## 2016-05-11 NOTE — Telephone Encounter (Signed)
Called patient to remind of lab and weight for 05-12-16 for Xofigo Inj., spoke with patient and he is aware of this appt.

## 2016-05-12 ENCOUNTER — Ambulatory Visit
Admission: RE | Admit: 2016-05-12 | Discharge: 2016-05-12 | Disposition: A | Discharge status: Home / Self Care | Payer: Medicare Other | Source: Ambulatory Visit | Attending: Radiation Oncology | Admitting: Radiation Oncology

## 2016-05-12 ENCOUNTER — Other Ambulatory Visit: Payer: Self-pay | Admitting: Radiation Oncology

## 2016-05-12 DIAGNOSIS — C7952 Secondary malignant neoplasm of bone marrow: Secondary | ICD-10-CM | POA: Diagnosis present

## 2016-05-12 DIAGNOSIS — C801 Malignant (primary) neoplasm, unspecified: Secondary | ICD-10-CM | POA: Diagnosis not present

## 2016-05-12 DIAGNOSIS — C7951 Secondary malignant neoplasm of bone: Secondary | ICD-10-CM | POA: Diagnosis present

## 2016-05-12 LAB — CBC WITH DIFFERENTIAL/PLATELET
BASO%: 0.5 % (ref 0.0–2.0)
Basophils Absolute: 0 10*3/uL (ref 0.0–0.1)
EOS ABS: 0.2 10*3/uL (ref 0.0–0.5)
EOS%: 3.3 % (ref 0.0–7.0)
HCT: 40.5 % (ref 38.4–49.9)
HGB: 13.1 g/dL (ref 13.0–17.1)
LYMPH%: 29.6 % (ref 14.0–49.0)
MCH: 28.8 pg (ref 27.2–33.4)
MCHC: 32.3 g/dL (ref 32.0–36.0)
MCV: 89.3 fL (ref 79.3–98.0)
MONO#: 0.7 10*3/uL (ref 0.1–0.9)
MONO%: 10.7 % (ref 0.0–14.0)
NEUT%: 55.9 % (ref 39.0–75.0)
NEUTROS ABS: 3.7 10*3/uL (ref 1.5–6.5)
Platelets: 208 10*3/uL (ref 140–400)
RBC: 4.54 10*6/uL (ref 4.20–5.82)
RDW: 14.6 % (ref 11.0–14.6)
WBC: 6.6 10*3/uL (ref 4.0–10.3)
lymph#: 1.9 10*3/uL (ref 0.9–3.3)

## 2016-05-18 ENCOUNTER — Telehealth: Payer: Self-pay | Admitting: *Deleted

## 2016-05-18 NOTE — Telephone Encounter (Signed)
CALLED PATIENT TO REMIND OF XOFIGO INJ. FOR 05-19-16, NO ANSWER WILL CALL LATER

## 2016-05-19 ENCOUNTER — Encounter (HOSPITAL_COMMUNITY)
Admission: RE | Admit: 2016-05-19 | Discharge: 2016-05-19 | Disposition: A | Discharge status: Home / Self Care | Payer: Medicare Other | Source: Ambulatory Visit | Attending: Radiation Oncology | Admitting: Radiation Oncology

## 2016-05-19 DIAGNOSIS — C7951 Secondary malignant neoplasm of bone: Secondary | ICD-10-CM | POA: Insufficient documentation

## 2016-05-19 DIAGNOSIS — C61 Malignant neoplasm of prostate: Secondary | ICD-10-CM | POA: Diagnosis present

## 2016-05-23 ENCOUNTER — Telehealth: Payer: Self-pay | Admitting: *Deleted

## 2016-05-23 ENCOUNTER — Other Ambulatory Visit: Payer: Self-pay | Admitting: Radiation Oncology

## 2016-05-23 DIAGNOSIS — C61 Malignant neoplasm of prostate: Secondary | ICD-10-CM

## 2016-05-23 DIAGNOSIS — C7951 Secondary malignant neoplasm of bone: Principal | ICD-10-CM

## 2016-05-23 NOTE — Telephone Encounter (Signed)
CALLED PATIENT TO INFORM OF LABS AND WEIGHT ON 06-16-16 @ 12:15 PM AND HIS XOFIGO  INJ. ON 06-23-16 @10 :23 AM @ [facility], SPOKE WITH PATIENT AND HE IS AWARE OF THESE APPTS.

## 2016-06-08 ENCOUNTER — Other Ambulatory Visit: Payer: Self-pay | Admitting: Family Medicine

## 2016-06-08 MED ORDER — METOPROLOL SUCCINATE ER 100 MG PO TB24
100.0000 mg | ORAL_TABLET | Freq: Every day | ORAL | 1 refills | Status: DC
Start: 2016-06-08 — End: 2016-12-23

## 2016-06-15 ENCOUNTER — Telehealth: Payer: Self-pay | Admitting: *Deleted

## 2016-06-15 NOTE — Telephone Encounter (Signed)
Called patient to remind of labs and weight for 06-16-16 @ 12:15 p.m., spoke with patient and he is aware of this appt.

## 2016-06-16 ENCOUNTER — Ambulatory Visit
Admission: RE | Admit: 2016-06-16 | Discharge: 2016-06-16 | Disposition: A | Discharge status: Home / Self Care | Payer: Medicare Other | Source: Ambulatory Visit | Attending: Radiation Oncology | Admitting: Radiation Oncology

## 2016-06-16 ENCOUNTER — Other Ambulatory Visit: Payer: Self-pay | Admitting: Radiation Oncology

## 2016-06-16 DIAGNOSIS — C7951 Secondary malignant neoplasm of bone: Secondary | ICD-10-CM | POA: Insufficient documentation

## 2016-06-16 DIAGNOSIS — C801 Malignant (primary) neoplasm, unspecified: Secondary | ICD-10-CM | POA: Insufficient documentation

## 2016-06-16 LAB — CBC WITH DIFFERENTIAL/PLATELET
BASO%: 0.5 % (ref 0.0–2.0)
Basophils Absolute: 0 10*3/uL (ref 0.0–0.1)
EOS%: 1.7 % (ref 0.0–7.0)
Eosinophils Absolute: 0.1 10*3/uL (ref 0.0–0.5)
HCT: 40.6 % (ref 38.4–49.9)
HEMOGLOBIN: 13.3 g/dL (ref 13.0–17.1)
LYMPH%: 20.8 % (ref 14.0–49.0)
MCH: 29.2 pg (ref 27.2–33.4)
MCHC: 32.6 g/dL (ref 32.0–36.0)
MCV: 89.5 fL (ref 79.3–98.0)
MONO#: 0.8 10*3/uL (ref 0.1–0.9)
MONO%: 9 % (ref 0.0–14.0)
NEUT%: 68 % (ref 39.0–75.0)
NEUTROS ABS: 5.8 10*3/uL (ref 1.5–6.5)
Platelets: 215 10*3/uL (ref 140–400)
RBC: 4.54 10*6/uL (ref 4.20–5.82)
RDW: 14.8 % — ABNORMAL HIGH (ref 11.0–14.6)
WBC: 8.5 10*3/uL (ref 4.0–10.3)
lymph#: 1.8 10*3/uL (ref 0.9–3.3)

## 2016-06-23 ENCOUNTER — Encounter (HOSPITAL_COMMUNITY)
Admission: RE | Admit: 2016-06-23 | Discharge: 2016-06-23 | Disposition: A | Discharge status: Home / Self Care | Payer: Medicare Other | Source: Ambulatory Visit | Attending: Radiation Oncology | Admitting: Radiation Oncology

## 2016-06-23 ENCOUNTER — Telehealth: Payer: Self-pay | Admitting: *Deleted

## 2016-06-23 DIAGNOSIS — C7951 Secondary malignant neoplasm of bone: Secondary | ICD-10-CM | POA: Insufficient documentation

## 2016-06-23 DIAGNOSIS — C61 Malignant neoplasm of prostate: Secondary | ICD-10-CM

## 2016-06-23 NOTE — Telephone Encounter (Signed)
CALLED PATIENT TO REMIND OF XOFIGO INJ. FOR 06-23-16, NO ANSWER.

## 2016-06-24 ENCOUNTER — Other Ambulatory Visit: Payer: Self-pay | Admitting: Radiation Oncology

## 2016-06-24 ENCOUNTER — Telehealth: Payer: Self-pay | Admitting: *Deleted

## 2016-06-24 DIAGNOSIS — C7951 Secondary malignant neoplasm of bone: Principal | ICD-10-CM

## 2016-06-24 DIAGNOSIS — C61 Malignant neoplasm of prostate: Secondary | ICD-10-CM

## 2016-06-24 NOTE — Telephone Encounter (Signed)
CALLED PATIENT TO INFORM OF LAB AND WEIGHT ON 07-21-16 @ 12:15 PM AND HIS XOFIGO INJ. ON 07-29-15 @ 10:45 AM @ [facility], SPOKE WITH PATIENT AND HE IS AWARE OF BOTH THESE APPTS.

## 2016-07-20 ENCOUNTER — Other Ambulatory Visit: Payer: Self-pay | Admitting: Radiation Oncology

## 2016-07-20 ENCOUNTER — Telehealth: Payer: Self-pay | Admitting: *Deleted

## 2016-07-20 DIAGNOSIS — C61 Malignant neoplasm of prostate: Secondary | ICD-10-CM

## 2016-07-20 DIAGNOSIS — C7951 Secondary malignant neoplasm of bone: Secondary | ICD-10-CM

## 2016-07-20 NOTE — Telephone Encounter (Signed)
Called patient to remind of lab and weight for Xofigo inj., spoke with patient and he will be here tomorrow @ 12:15 p.m.

## 2016-07-21 ENCOUNTER — Ambulatory Visit
Admission: RE | Admit: 2016-07-21 | Discharge: 2016-07-21 | Disposition: A | Discharge status: Home / Self Care | Payer: Medicare Other | Source: Ambulatory Visit | Attending: Radiation Oncology | Admitting: Radiation Oncology

## 2016-07-21 DIAGNOSIS — C7951 Secondary malignant neoplasm of bone: Secondary | ICD-10-CM

## 2016-07-21 DIAGNOSIS — C61 Malignant neoplasm of prostate: Secondary | ICD-10-CM

## 2016-07-21 LAB — CBC WITH DIFFERENTIAL/PLATELET
BASO%: 0.4 % (ref 0.0–2.0)
BASOS ABS: 0 10*3/uL (ref 0.0–0.1)
EOS ABS: 0.1 10*3/uL (ref 0.0–0.5)
EOS%: 1.8 % (ref 0.0–7.0)
HCT: 38.5 % (ref 38.4–49.9)
HGB: 12.6 g/dL — ABNORMAL LOW (ref 13.0–17.1)
LYMPH%: 30.2 % (ref 14.0–49.0)
MCH: 29.4 pg (ref 27.2–33.4)
MCHC: 32.7 g/dL (ref 32.0–36.0)
MCV: 89.7 fL (ref 79.3–98.0)
MONO#: 0.6 10*3/uL (ref 0.1–0.9)
MONO%: 11.6 % (ref 0.0–14.0)
NEUT#: 3.1 10*3/uL (ref 1.5–6.5)
NEUT%: 56 % (ref 39.0–75.0)
PLATELETS: 191 10*3/uL (ref 140–400)
RBC: 4.29 10*6/uL (ref 4.20–5.82)
RDW: 14.1 % (ref 11.0–14.6)
WBC: 5.5 10*3/uL (ref 4.0–10.3)
lymph#: 1.7 10*3/uL (ref 0.9–3.3)

## 2016-07-27 ENCOUNTER — Telehealth: Payer: Self-pay | Admitting: *Deleted

## 2016-07-27 NOTE — Telephone Encounter (Signed)
Called patient to remind of Xofigo Inj. for 07-28-16 @ 10:45 am, spoke with patient and he is aware of this inj.

## 2016-07-28 ENCOUNTER — Ambulatory Visit (HOSPITAL_COMMUNITY): Admission: RE | Admit: 2016-07-28 | Payer: Medicare Other | Source: Ambulatory Visit

## 2016-07-29 ENCOUNTER — Ambulatory Visit (HOSPITAL_COMMUNITY)
Admission: RE | Admit: 2016-07-29 | Discharge: 2016-07-29 | Disposition: A | Discharge status: Home / Self Care | Payer: Medicare Other | Source: Ambulatory Visit | Attending: Radiation Oncology | Admitting: Radiation Oncology

## 2016-07-29 DIAGNOSIS — C61 Malignant neoplasm of prostate: Secondary | ICD-10-CM | POA: Insufficient documentation

## 2016-07-29 DIAGNOSIS — C7951 Secondary malignant neoplasm of bone: Secondary | ICD-10-CM | POA: Diagnosis present

## 2016-07-29 NOTE — Progress Notes (Signed)
  Radiation Oncology         [phone] ________________________________  Name: Gregory [name] MRN: [id number]  Date: 07/29/2016  DOB: 1929-01-21  Radium-223 Infusion Note  Diagnosis:  Castration resistant prostate cancer with painful bone involvement  Current Infusion:    4  Planned Infusions:  6  Narrative: [name] presented to nuclear medicine for treatment. His most recent blood counts were reviewed.  He remains a good candidate to proceed with Ra-223.  The patient was situated in an infusion suite with a contact barrier placed under his arm. Intravenous access was established, using sterile technique, and a normal saline infusion from a syringe was started.  Micro-dosimetry:  The prescribed radiation activity was assayed and confirmed to be within specified tolerance.  Special Treatment Procedure - Infusion:  The nuclear medicine technologist and I personally verified the dose activity to be delivered as specified in the written directive, and verified the patient identification via 2 separate methods.  The syringe containing the dose was attached to a 3 way stopcock, and then the valve was opened to the patient, and the dose delivered over a minute. No complications were noted.  The total administered dose was 131.3 microcuries in a volume of 5.2 cc.   A saline flush of the line and the syringe that contained the isotope was then performed.  The residual radioactivity in the syringe was 4.00 microcuries, so the actual infused isotope activity was 127.3 microcuries.   Pressure was applied to the venipuncture site, and a compression bandage placed.   Radiation Safety personnel were present to perform the discharge survey, as detailed on their documentation.   After a short period of observation, the patient had his IV removed.  Impression:  The patient tolerated his infusion relatively well.  Plan:  The patient will return in one month for ongoing care.      ________________________________  Sheral Apley. Tammi Klippel, M.D.  This document serves as a record of services personally performed by Tyler Pita, MD. It was created on his behalf by Darcus Austin, a trained medical scribe. The creation of this record is based on the scribe's personal observations and the provider's statements to them. This document has been checked and approved by the attending provider.

## 2016-08-02 ENCOUNTER — Telehealth: Payer: Self-pay | Admitting: *Deleted

## 2016-08-02 ENCOUNTER — Other Ambulatory Visit: Payer: Self-pay | Admitting: Radiation Oncology

## 2016-08-02 DIAGNOSIS — C7951 Secondary malignant neoplasm of bone: Principal | ICD-10-CM

## 2016-08-02 DIAGNOSIS — C61 Malignant neoplasm of prostate: Secondary | ICD-10-CM

## 2016-08-02 NOTE — Telephone Encounter (Signed)
CALLED PATIENT TO INFORM OF LAB AND WEIGHT ON  08-25-16 @ 12:15 PM AND HIS XOFIGO INJ. ON 09-01-16 - @ 11:45 AM @ [facility], SPOKE WITH PATIENT AND HE IS AWARE OF THESE APPTS.

## 2016-08-24 ENCOUNTER — Telehealth: Payer: Self-pay | Admitting: *Deleted

## 2016-08-24 NOTE — Telephone Encounter (Signed)
CALLED PATIENT TO REMIND OF LABS AND WEIGHT FOR 08-25-16 @ 12:15 PM, SPOKE WITH PATIENT AND HE IS AWARE OF THESE APPTS.

## 2016-08-25 ENCOUNTER — Ambulatory Visit
Admission: RE | Admit: 2016-08-25 | Discharge: 2016-08-25 | Disposition: A | Discharge status: Home / Self Care | Payer: Medicare Other | Source: Ambulatory Visit | Attending: Radiation Oncology | Admitting: Radiation Oncology

## 2016-08-25 ENCOUNTER — Other Ambulatory Visit: Payer: Self-pay | Admitting: Radiation Oncology

## 2016-08-25 DIAGNOSIS — C61 Malignant neoplasm of prostate: Secondary | ICD-10-CM | POA: Diagnosis not present

## 2016-08-25 LAB — CBC WITH DIFFERENTIAL/PLATELET
BASO%: 0.6 % (ref 0.0–2.0)
Basophils Absolute: 0 10*3/uL (ref 0.0–0.1)
EOS ABS: 0.2 10*3/uL (ref 0.0–0.5)
EOS%: 4.2 % (ref 0.0–7.0)
HCT: 37.8 % — ABNORMAL LOW (ref 38.4–49.9)
HEMOGLOBIN: 12.6 g/dL — AB (ref 13.0–17.1)
LYMPH%: 24.9 % (ref 14.0–49.0)
MCH: 30.3 pg (ref 27.2–33.4)
MCHC: 33.3 g/dL (ref 32.0–36.0)
MCV: 90.8 fL (ref 79.3–98.0)
MONO#: 0.7 10*3/uL (ref 0.1–0.9)
MONO%: 13.4 % (ref 0.0–14.0)
NEUT%: 56.9 % (ref 39.0–75.0)
NEUTROS ABS: 3 10*3/uL (ref 1.5–6.5)
PLATELETS: 178 10*3/uL (ref 140–400)
RBC: 4.16 10*6/uL — AB (ref 4.20–5.82)
RDW: 14.5 % (ref 11.0–14.6)
WBC: 5.3 10*3/uL (ref 4.0–10.3)
lymph#: 1.3 10*3/uL (ref 0.9–3.3)

## 2016-08-31 ENCOUNTER — Telehealth: Payer: Self-pay | Admitting: *Deleted

## 2016-08-31 NOTE — Telephone Encounter (Signed)
CALLED PATIENT TO REMIND OF XOFIGO INJ. ON 09-01-16 - ARRIVAL TIME - 11:45 AM @ [facility], SPOKE WITH PATIENT AND HE IS AWARE OF THIS INJ.

## 2016-09-01 ENCOUNTER — Other Ambulatory Visit: Payer: Self-pay | Admitting: Radiation Oncology

## 2016-09-01 ENCOUNTER — Ambulatory Visit (HOSPITAL_COMMUNITY)
Admission: RE | Admit: 2016-09-01 | Discharge: 2016-09-01 | Disposition: A | Discharge status: Home / Self Care | Payer: Medicare Other | Source: Ambulatory Visit | Attending: Radiation Oncology | Admitting: Radiation Oncology

## 2016-09-01 ENCOUNTER — Inpatient Hospital Stay (HOSPITAL_COMMUNITY): Admission: RE | Admit: 2016-09-01 | Payer: Self-pay | Source: Ambulatory Visit

## 2016-09-01 DIAGNOSIS — C61 Malignant neoplasm of prostate: Secondary | ICD-10-CM | POA: Insufficient documentation

## 2016-09-01 DIAGNOSIS — C7951 Secondary malignant neoplasm of bone: Secondary | ICD-10-CM | POA: Insufficient documentation

## 2016-09-01 MED ORDER — OXYCODONE HCL 5 MG PO TABS: 5.0000 mg | ORAL_TABLET | ORAL | 0 refills | Status: DC | PRN

## 2016-09-01 MED ORDER — RADIUM RA 223 DICHLORIDE 30 MCCI/ML IV SOLN
134.9000 | Freq: Once | INTRAVENOUS | Status: AC
  Administered 2016-09-01: 134.9 via INTRAVENOUS

## 2016-09-01 NOTE — Progress Notes (Signed)
  Radiation Oncology         [phone] ________________________________  Name: Gregory [name] MRN: [id number]  Date: 09/01/2016  DOB: [birth year]  Radium-223 Infusion Note  Diagnosis:  Castration resistant prostate cancer with painful bone involvement  Current Infusion:    5  Planned Infusions:  6  Narrative: Gregory [name] presented to nuclear medicine for treatment. His most recent blood counts were reviewed.  He remains a good candidate to proceed with Ra-223.  The patient was situated in an infusion suite with a contact barrier placed under his arm. Intravenous access was established, using sterile technique, and a normal saline infusion from a syringe was started.  Micro-dosimetry:  The prescribed radiation activity was assayed and confirmed to be within specified tolerance.  Special Treatment Procedure - Infusion:  The nuclear medicine technologist and I personally verified the dose activity to be delivered as specified in the written directive, and verified the patient identification via 2 separate methods.  The syringe containing the dose was attached to a 3 way stopcock, and then the valve was opened to the patient, and the dose delivered over a minute. No complications were noted.  The total administered dose was 140.0 microcuries in a volume of 4.67 cc.  A saline flush of the line and the syringe that contained the isotope was then performed.  The residual radioactivity in the syringe was 5.10 microcuries, so the actual infused isotope activity was 134.9 microcuries.   Pressure was applied to the venipuncture site, and a compression bandage placed.   Radiation Safety personnel were present to perform the discharge survey, as detailed on their documentation.   After a short period of observation, the patient had his IV removed.  Impression:  The patient tolerated his infusion relatively well.  Plan:  The patient will return in one month for ongoing care. I will prescribe hydrocodone  for the patient to take as needed for leg pain.    ________________________________  Sheral Apley. Tammi Klippel, M.D.  This document serves as a record of services personally performed by Tyler Pita, MD. It was created on his behalf by Maryla Morrow, a trained medical scribe. The creation of this record is based on the scribe's personal observations and the provider's statements to them. This document has been checked and approved by the attending provider.

## 2016-09-06 ENCOUNTER — Other Ambulatory Visit: Payer: Self-pay | Admitting: Radiation Oncology

## 2016-09-06 ENCOUNTER — Telehealth: Payer: Self-pay | Admitting: *Deleted

## 2016-09-06 DIAGNOSIS — C61 Malignant neoplasm of prostate: Secondary | ICD-10-CM

## 2016-09-06 DIAGNOSIS — C7951 Secondary malignant neoplasm of bone: Principal | ICD-10-CM

## 2016-09-06 NOTE — Telephone Encounter (Signed)
CALLED PATIENT TO INFORM OF LAB AND WEIGHT ON 09-22-16 @ 12:15 PM @ [facility]. ON 09-29-16 @ 11:15 AM @ [facility], SPOKE WITH PATIENT AND HE IS AWARE OF THIS INJ.

## 2016-09-21 ENCOUNTER — Telehealth: Payer: Self-pay | Admitting: *Deleted

## 2016-09-21 ENCOUNTER — Other Ambulatory Visit: Payer: Self-pay | Admitting: Radiation Oncology

## 2016-09-21 DIAGNOSIS — C7951 Secondary malignant neoplasm of bone: Secondary | ICD-10-CM

## 2016-09-21 NOTE — Telephone Encounter (Signed)
Called patient to remind of lab and weight for 09-22-16 @ 12:15 pm, spoke with patient and he is aware of this appt.

## 2016-09-22 ENCOUNTER — Ambulatory Visit
Admission: RE | Admit: 2016-09-22 | Discharge: 2016-09-22 | Disposition: A | Discharge status: Home / Self Care | Payer: Medicare Other | Source: Ambulatory Visit | Attending: Radiation Oncology | Admitting: Radiation Oncology

## 2016-09-22 DIAGNOSIS — C7951 Secondary malignant neoplasm of bone: Secondary | ICD-10-CM | POA: Diagnosis present

## 2016-09-22 LAB — CBC WITH DIFFERENTIAL/PLATELET
BASO%: 0.3 % (ref 0.0–2.0)
Basophils Absolute: 0 10*3/uL (ref 0.0–0.1)
EOS%: 3.3 % (ref 0.0–7.0)
Eosinophils Absolute: 0.2 10*3/uL (ref 0.0–0.5)
HCT: 38 % — ABNORMAL LOW (ref 38.4–49.9)
HGB: 12.5 g/dL — ABNORMAL LOW (ref 13.0–17.1)
LYMPH%: 31 % (ref 14.0–49.0)
MCH: 29.9 pg (ref 27.2–33.4)
MCHC: 32.9 g/dL (ref 32.0–36.0)
MCV: 90.9 fL (ref 79.3–98.0)
MONO#: 0.6 10*3/uL (ref 0.1–0.9)
MONO%: 11 % (ref 0.0–14.0)
NEUT#: 3.1 10*3/uL (ref 1.5–6.5)
NEUT%: 54.4 % (ref 39.0–75.0)
Platelets: 167 10*3/uL (ref 140–400)
RBC: 4.18 10*6/uL — AB (ref 4.20–5.82)
RDW: 13.8 % (ref 11.0–14.6)
WBC: 5.8 10*3/uL (ref 4.0–10.3)
lymph#: 1.8 10*3/uL (ref 0.9–3.3)

## 2016-09-29 ENCOUNTER — Ambulatory Visit (HOSPITAL_COMMUNITY)
Admission: RE | Admit: 2016-09-29 | Discharge: 2016-09-29 | Disposition: A | Discharge status: Home / Self Care | Payer: Medicare Other | Source: Ambulatory Visit | Attending: Radiation Oncology | Admitting: Radiation Oncology

## 2016-09-29 DIAGNOSIS — C61 Malignant neoplasm of prostate: Secondary | ICD-10-CM | POA: Insufficient documentation

## 2016-09-29 DIAGNOSIS — C7951 Secondary malignant neoplasm of bone: Secondary | ICD-10-CM | POA: Diagnosis present

## 2016-09-29 DIAGNOSIS — G893 Neoplasm related pain (acute) (chronic): Secondary | ICD-10-CM | POA: Diagnosis not present

## 2016-09-29 MED ORDER — RADIUM RA 223 DICHLORIDE 30 MCCI/ML IV SOLN
140.0000 | Freq: Once | INTRAVENOUS | Status: AC
  Administered 2016-09-29: 140 via INTRAVENOUS

## 2016-09-29 NOTE — Progress Notes (Signed)
  Radiation Oncology         [phone] ________________________________  Name: Gregory [name] MRN: [id number]  Date: 09/29/2016  DOB: [birth year]  Radium-223 Infusion Note  Diagnosis:  Castration resistant prostate cancer with painful bone involvement  Current Infusion:    6  Planned Infusions:  6  Narrative: [name] presented to nuclear medicine for treatment. His most recent blood counts were reviewed.  He remains a good candidate to proceed with Ra-223.  The patient was situated in an infusion suite with a contact barrier placed under his arm. Intravenous access was established, using sterile technique, and a normal saline infusion from a syringe was started.  Micro-dosimetry:  The prescribed radiation activity was assayed and confirmed to be within specified tolerance.  Special Treatment Procedure - Infusion:  The nuclear medicine technologist and I personally verified the dose activity to be delivered as specified in the written directive, and verified the patient identification via 2 separate methods.  The syringe containing the dose was attached to a 3 way stopcock, and then the valve was opened to the patient, and the dose delivered over a minute. No complications were noted.  The total administered dose was 141.5 microcuries in a volume of 6.0 cc.  A saline flush of the line and the syringe that contained the isotope was then performed.  The residual radioactivity in the syringe was 3.6 microcuries, so the actual infused isotope activity was 137.9 microcuries.   Pressure was applied to the venipuncture site, and a compression bandage placed.   Radiation Safety personnel were present to perform the discharge survey, as detailed on their documentation.   After a short period of observation, the patient had his IV removed.  Impression:  The patient tolerated his infusion relatively well.  Plan:  The patient has completed his cycle of Xofigo infusions. Pt will follow-up in one  month.   ________________________________  Sheral Apley. Tammi Klippel, M.D.  This document serves as a record of services personally performed by Tyler Pita, MD. It was created on his behalf by Maryla Morrow, a trained medical scribe. The creation of this record is based on the scribe's personal observations and the provider's statements to them. This document has been checked and approved by the attending provider.

## 2016-09-30 ENCOUNTER — Telehealth: Payer: Self-pay | Admitting: *Deleted

## 2016-09-30 NOTE — Telephone Encounter (Signed)
CALLED PATIENT TO INFORM OF 11/03/16 @ 10:30 AM WITH ASHLYN BRUNING, SPOKE WITH PATIENT AND HE IS AWARE OF THIS APPT.

## 2016-11-01 ENCOUNTER — Encounter: Payer: Self-pay | Admitting: Urology

## 2016-11-01 NOTE — Progress Notes (Signed)
Recent PSA with Dr. Diona Fanti on 10/26/16 was 5.15 which is increased from 2.94 on 02/08/16 and 2.32 11/2015.  Testosterone is 32.7.  Patient is currently on Casodex and Lupron.  Completed Xofigo 6/6 on 09/29/16.  Has been treated with Delton See previously but discontinued due to cost.

## 2016-11-03 ENCOUNTER — Encounter: Payer: Self-pay | Admitting: Urology

## 2016-11-03 ENCOUNTER — Encounter: Payer: Self-pay | Admitting: Oncology

## 2016-11-03 ENCOUNTER — Ambulatory Visit
Admission: RE | Admit: 2016-11-03 | Discharge: 2016-11-03 | Disposition: A | Discharge status: Home / Self Care | Payer: Medicare Other | Source: Ambulatory Visit | Attending: Urology | Admitting: Urology

## 2016-11-03 ENCOUNTER — Telehealth: Payer: Self-pay | Admitting: Oncology

## 2016-11-03 VITALS — BP 160/90 | HR 113 | Temp 98.1°F | Resp 18 | Ht <= 58 in | Wt 209.4 lb

## 2016-11-03 DIAGNOSIS — C61 Malignant neoplasm of prostate: Secondary | ICD-10-CM

## 2016-11-03 DIAGNOSIS — C7951 Secondary malignant neoplasm of bone: Secondary | ICD-10-CM

## 2016-11-03 NOTE — Progress Notes (Signed)
Radiation Oncology         [phone] ________________________________  Name: Gregory [name] MRN: [id number]  Date: 11/03/2016  DOB: 10-Mar-1929  Post Treatment Note  CC: Dorena Dew, FNP  Dorena Dew, FNP  Diagnosis:   Castration resistant prostate cancer with painful bone involvement  Interval Since Last Radiation:  5 weeks  Recently completed 6/6 Xofigo infusions (07/29/16 - 09/29/16)  10/01/2015-10/14/2015: The right femoral neck was treated to 30 Gy in 10 fractions of 3 Gy per fraction.   Narrative:  The patient returns today for routine follow-up.  He tolerated Xofigo infusions well. He has continued on Lupron and Firmagon under the care of Dr. Diona Fanti.  Recent PSA 10/26/16 was 5.15 which is increased from 2.94 on 02/08/16 and 2.32 in 11/2015.  Testosterone remains at castrate level at 32.7 recently.  Despite treatment, he continues to show evidence of disease progression with rising PSA.  Dr. Diona Fanti has recommended that he consult with Dr. Alen Blew for consideration of other systemic therapies that might benefit him in the management of his disease.  He has been on Xgeva in the past but discontinued this due to costs.                          On review of systems, the patient states that he is currently without any bony pain or joint pains.  In general, he feels well, aside from fatigue which he attributes to ADT.  He denies N/V/D or abdominal pain.  He denies fever, chills, cough, CP or shortness of breath.  He has noticed some decline in his short and long term memory and reports occasional visual hallucinations where he sees people that are not really there. He has also had some "strange thoughts" recently but reports that these come and go quickly.  He denies thoughts of harming himself or others. This has been most noticeable over the past 1-2 months.  He also reports intermittent "burning" sensation in bilateral lower extremities that only occurs at night and resolves with  changing position in bed.  He denies difficulty walking, pain or numbness/tingling in the LEs.  ALLERGIES:  has No Known Allergies.  Meds: Current Outpatient Prescriptions  Medication Sig Dispense Refill  . metoprolol succinate (TOPROL XL) 100 MG 24 hr tablet Take 1 tablet (100 mg total) by mouth daily. Take with or immediately following a meal. 90 tablet 1  . Multiple Vitamins-Minerals (MULTIVITAMIN WITH MINERALS) tablet Take 1 tablet by mouth every morning.     Marland Kitchen oxyCODONE (OXY IR/ROXICODONE) 5 MG immediate release tablet Take 1 tablet (5 mg total) by mouth every 4 (four) hours as needed for severe pain. (Patient not taking: Reported on 11/03/2016) 30 tablet 0  . polyethylene glycol (MIRALAX / GLYCOLAX) packet Take 17 g by mouth daily as needed for mild constipation. (Patient not taking: Reported on 11/03/2016) 14 each 0   No current facility-administered medications for this encounter.     Physical Findings:  height is 6" (0.152 m) (abnormal) and weight is 209 lb 6.4 oz (95 kg). His oral temperature is 98.1 F (36.7 C). His blood pressure is 160/90 (abnormal) and his pulse is 113 (abnormal). His respiration is 18 and oxygen saturation is 99%.  Pain Assessment Pain Score: 0-No pain/10 In general this is a well appearing african [place] male in no acute distress. He's alert and oriented x4 and appropriate throughout the examination. Cardiopulmonary assessment is negative for acute distress and  he exhibits normal effort.   Lab Findings: Lab Results  Component Value Date   WBC 5.8 09/22/2016   HGB 12.5 (L) 09/22/2016   HCT 38.0 (L) 09/22/2016   MCV 90.9 09/22/2016   PLT 167 09/22/2016     Radiographic Findings: No results found.  Impression/Plan: 1. Castration resistant prostate cancer with painful bone involvement.  Unfortunately, he appears to have progression of disease despite current therapies.  Dr. Diona Fanti has made a referral to Dr. Alen Blew for consideration of other  systemic therapies to assist in the management of his disease.  We are happy to continue to participate in his care as needed.  He knows to call the office with any questions or concerns related to his previous radiotherapy.    Nicholos Johns, PA-C

## 2016-11-03 NOTE — Progress Notes (Signed)
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  Pain: None Skin changes:Normal to right femur c/o feeling hot and burning early in the morning. Numbness or weakness of extremities: None Fatigue:Having fatigue still walking as he is able outside his home. Appetite:Great . Weight: Wt Readings from Last 3 Encounters:  11/03/16 209 lb 6.4 oz (95 kg)  02/22/16 210 lb (95.3 kg)  12/02/15 206 lb 14.4 oz (93.8 kg)  BP (!) 160/90   Pulse (!) 113   Temp 98.1 F (36.7 C) (Oral)   Resp 18   Ht (!) 6" (0.152 m)   Wt 209 lb 6.4 oz (95 kg)   SpO2 99%   BMI 4089.56 kg/m

## 2016-11-03 NOTE — Addendum Note (Signed)
Encounter addended by: Malena Edman, RN on: 11/03/2016  1:20 PM<BR>    Actions taken: Charge Capture section accepted

## 2016-11-03 NOTE — Telephone Encounter (Signed)
Appt has been scheduled for the pt to see Dr. Alen Blew on 5/10 at 2pm. Pt aware to arrive 30 minutes early. Pt agreed to the appt date and time. Letter mailed to the pt and faxed to the referring.

## 2016-11-17 ENCOUNTER — Ambulatory Visit (HOSPITAL_BASED_OUTPATIENT_CLINIC_OR_DEPARTMENT_OTHER): Payer: Medicare Other | Admitting: Oncology

## 2016-11-17 ENCOUNTER — Telehealth: Payer: Self-pay | Admitting: Oncology

## 2016-11-17 ENCOUNTER — Encounter: Payer: Self-pay | Admitting: Medical Oncology

## 2016-11-17 VITALS — BP 110/76 | HR 100 | Temp 97.9°F | Resp 18 | Ht 72.0 in | Wt 208.2 lb

## 2016-11-17 DIAGNOSIS — C61 Malignant neoplasm of prostate: Secondary | ICD-10-CM | POA: Diagnosis not present

## 2016-11-17 DIAGNOSIS — C7951 Secondary malignant neoplasm of bone: Secondary | ICD-10-CM

## 2016-11-17 DIAGNOSIS — E291 Testicular hypofunction: Secondary | ICD-10-CM

## 2016-11-17 MED ORDER — ENZALUTAMIDE 40 MG PO CAPS: 160.0000 mg | ORAL_CAPSULE | Freq: Every day | ORAL | 0 refills | Status: DC

## 2016-11-17 NOTE — Progress Notes (Signed)
Reason for Referral: Prostate cancer.   HPI: [name] is a pleasant 83 year old gentleman currently living in [place]. He is a gentleman with reasonably good health except for mild hypertension. He was diagnosed with prostate cancer in 2016. At that time he presented with a PSA of 18.7 and a digital rectal examination showed a nodule or gland. He subsequently developed urinary retention and was evaluated by Dr. Dr. Diona Fanti at that time he underwent a biopsy in January 2017 which showed a Gleason score 4+5 = 9 in 12 cores. Imaging studies showed metastatic disease predominantly to the bone with significant involvement of his proximal femur as well as multiple areas including the cervical, thoracic and lumbar spine as well as bilateral hips. He subsequently underwent a TURP procedure and orchiectomy in February 2017. He also received prophylactic radiation to the right femoral hip for a total of 30 gray in 10 fractions in the care of Dr. Tammi Klippel completed in April 2017.  His PSA started to rise again with the PSA of 2.94 in July 2017. He received Xofigo for a total of 6 treatments completed in March 2018. His most recent PSA was 5.15 in 10/26/2016 with castrate level testosterone. He was referred to me for further evaluation of castration resistant disease.   For the most part he reports no symptoms related to his cancer. He did have increase in his knee pain which was thought initially to be related to his prostate cancer but his pain have subsided at this time. He was prescribed oxycodone which she does not take or has not taken. He still ambulates with the help of a cane without any falls or syncope. He does not report any back pain, shoulder pain or pathological fractures. His appetite is excellent and continues to have reasonable quality of life.   He is not reporting any headaches, blurry vision, syncope or seizures. He does not report any fevers, chills, sweats or weight loss. He does not  report any chest pain, palpitation, orthopnea or leg edema. He is not report any cough, wheezing or hemoptysis. He is not reporting nausea, vomiting or abdominal pain. He is not reporting frequency urgency or hesitancy. He does not report any hematuria or dysuria. He does not report any skeletal complaints. Remaining review of systems unremarkable.   Past Medical History:  Diagnosis Date  . Bone metastases ([place])   . BPH (benign prostatic hyperplasia)   . Foley catheter in place    Urinary Retension  . GERD (gastroesophageal reflux disease)   . Hypertension   . Prostate cancer [facility])   urologist- dr dahlstedt/  oncologist-  dr manning    Gleason 4+5,  PSA 18.74,  w/  Visceral and Extensive Bones METS--  pallitive radiation therapy and hormone therapy  :  Past Surgical History:  Procedure Laterality Date  . MULTIPLE TOOTH EXTRACTIONS  1980's  . ORCHIECTOMY Bilateral 08/27/2015   Procedure: ORCHIECTOMY;  Surgeon: Franchot Gallo, MD;  Location: [facility];  Service: Urology;  Laterality: Bilateral;  . PROSTATE BIOPSY  1/16 /2017  . TRANSURETHRAL RESECTION OF PROSTATE N/A 08/27/2015   Procedure: TRANSURETHRAL RESECTION OF THE PROSTATE WITH GYRUS INSTRUMENTS;  Surgeon: Franchot Gallo, MD;  Location: [facility];  Service: Urology;  Laterality: N/A;  :   Current Outpatient Prescriptions:  .  metoprolol succinate (TOPROL XL) 100 MG 24 hr tablet, Take 1 tablet (100 mg total) by mouth daily. Take with or immediately following a meal., Disp: 90 tablet, Rfl: 1 .  Multiple Vitamins-Minerals (MULTIVITAMIN WITH MINERALS) tablet, Take 1 tablet by mouth every morning. , Disp: , Rfl:  .  polyethylene glycol (MIRALAX / GLYCOLAX) packet, Take 17 g by mouth daily as needed for mild constipation., Disp: 14 each, Rfl: 0 .  enzalutamide (XTANDI) 40 MG capsule, Take 4 capsules (160 mg total) by mouth daily., Disp: 120 capsule, Rfl: 0:  No Known Allergies:  Family History   Problem Relation Age of Onset  . Tuberculosis Mother 21  . Other Father 9  :  Social History   Social History  . Marital status: Divorced    Spouse name: N/A  . Number of children: N/A  . Years of education: N/A   Occupational History  . Retired    Social History Main Topics  . Smoking status: Former Smoker    Packs/day: 0.25    Years: 30.00    Types: Cigarettes    Start date: 07/11/1992  . Smokeless tobacco: Never Used  . Alcohol use No  . Drug use: No  . Sexual activity: No   Other Topics Concern  . Not on file   Social History Narrative  . No narrative on file  :  Pertinent items are noted in HPI.  Exam: Blood pressure 110/76, pulse 100, temperature 97.9 F (36.6 C), temperature source Oral, resp. rate 18, height 6' (1.829 m), weight 208 lb 3 oz (94.4 kg), SpO2 100 %.  ECOG 1.  General appearance: alert and cooperative appeared without distress. Throat: No oral thrush or ulcers. Neck: no adenopathy Back: negative Resp: clear to auscultation bilaterally without wheezes or dullness to percussion. Chest wall: no tenderness Cardio: regular rate and rhythm, S1, S2 normal, no murmur, click, rub or gallop GI: soft, non-tender; bowel sounds normal; no masses,  no organomegaly Extremities: extremities normal, atraumatic, no cyanosis or edema Pulses: 2+ and symmetric  CBC    Component Value Date/Time   WBC 5.8 09/22/2016 1242   WBC 7.7 09/04/2015 0430   RBC 4.18 (L) 09/22/2016 1242   RBC 2.95 (L) 09/04/2015 0430   HGB 12.5 (L) 09/22/2016 1242   HCT 38.0 (L) 09/22/2016 1242   PLT 167 09/22/2016 1242   MCV 90.9 09/22/2016 1242   MCH 29.9 09/22/2016 1242   MCH 29.8 09/04/2015 0430   MCHC 32.9 09/22/2016 1242   MCHC 32.4 09/04/2015 0430   RDW 13.8 09/22/2016 1242   LYMPHSABS 1.8 09/22/2016 1242   MONOABS 0.6 09/22/2016 1242   EOSABS 0.2 09/22/2016 1242   BASOSABS 0.0 09/22/2016 1242     Chemistry      Component Value Date/Time   NA 146 (H) 09/05/2015  0850   K 3.7 09/05/2015 0850   CL 109 09/05/2015 0850   CO2 27 09/05/2015 0850   BUN 14 09/05/2015 0850   CREATININE 1.38 (H) 09/05/2015 0850   CREATININE 1.17 (H) 07/14/2015 1351      Component Value Date/Time   CALCIUM 8.5 (L) 09/05/2015 0850   ALKPHOS 215 (H) 08/30/2015 0607   AST 35 08/30/2015 0607   ALT 14 (L) 08/30/2015 0607   BILITOT 0.8 08/30/2015 0607       Assessment and Plan:   83 year old gentleman with the following issues:  1. Castration resistant prostate cancer with metastatic disease to the bone. He is initial diagnosis was in 2016 when he presented with a PSA of 18.7 and a Gleason score 4+5 = 9. He developed advanced bony metastasis from presentation. He was treated with bilateral orchiectomy and TURP in January  2017.  He also received palliative radiation therapy to the right femur in April 2017 and completed 6 treatments of Xofigo in March 2018. PSA in April 2018 increased to 5.15 from 2.32 in May 2017.  The natural course of castration resistant metastatic prostate cancer was discussed with the patient today. Treatment options were reviewed which include second line hormonal therapy such as Nicki Reaper as well as systemic chemotherapy. Observation and surveillance and supportive care only was also discussed as an alternative.  He understands that he has an incurable malignancy although he is interested in pursuing treatment that will not affect his quality of life. He is not willing to jeopardize his quality of life for extending his overall survival.  After understanding his goals of care, Gillermina Phy will be a better option for him given the better tolerance and possible palliation of his cancer for a period of time. Complications associated with this medication include fatigue, edema and weight loss. Seizure has been reported but considered rare. After discussion today, he is agreeable to proceed and written information was given to the patient.  2. Androgen  depravation: He is status post bilateral orchiectomy.  3. Bone directed therapy: He would be a good candidate for Xgeva in the future.  4. Prognosis: He has an incurable malignancy is still has a good performance status. Palliative therapy is warranted at this time.  5. Follow-up: In the next 4-6 weeks following his progress.

## 2016-11-17 NOTE — Telephone Encounter (Signed)
Appointments scheduled per 11/17/16 los. Patient was given a copy of the AVS report and appointment schedule per 11/17/16 los. °

## 2016-11-17 NOTE — Progress Notes (Signed)
Introduced myself to [name] as the prostate nurse navigator and my role. He has received radiation to his hip and also received Xofigo with Dr. Tammi Klippel. His PSA continues to rise and he consulted with Dr. Alen Blew today. He was given printed patient education materials on [place]. I informed him that [name]- pharmacist will be working with him to get the prescription. I will continue to follow. I gave him my business card and asked him to call with questions or concerns.

## 2016-11-21 IMAGING — DX DG CHEST 2V
3 series · 3 of 3 positions shown · non-contrast
Comparison: 08/30/2014

CLINICAL DATA: Fatigue, shortness of breath for 3 weeks.

EXAM:
CHEST  2 VIEW

[chest lat]
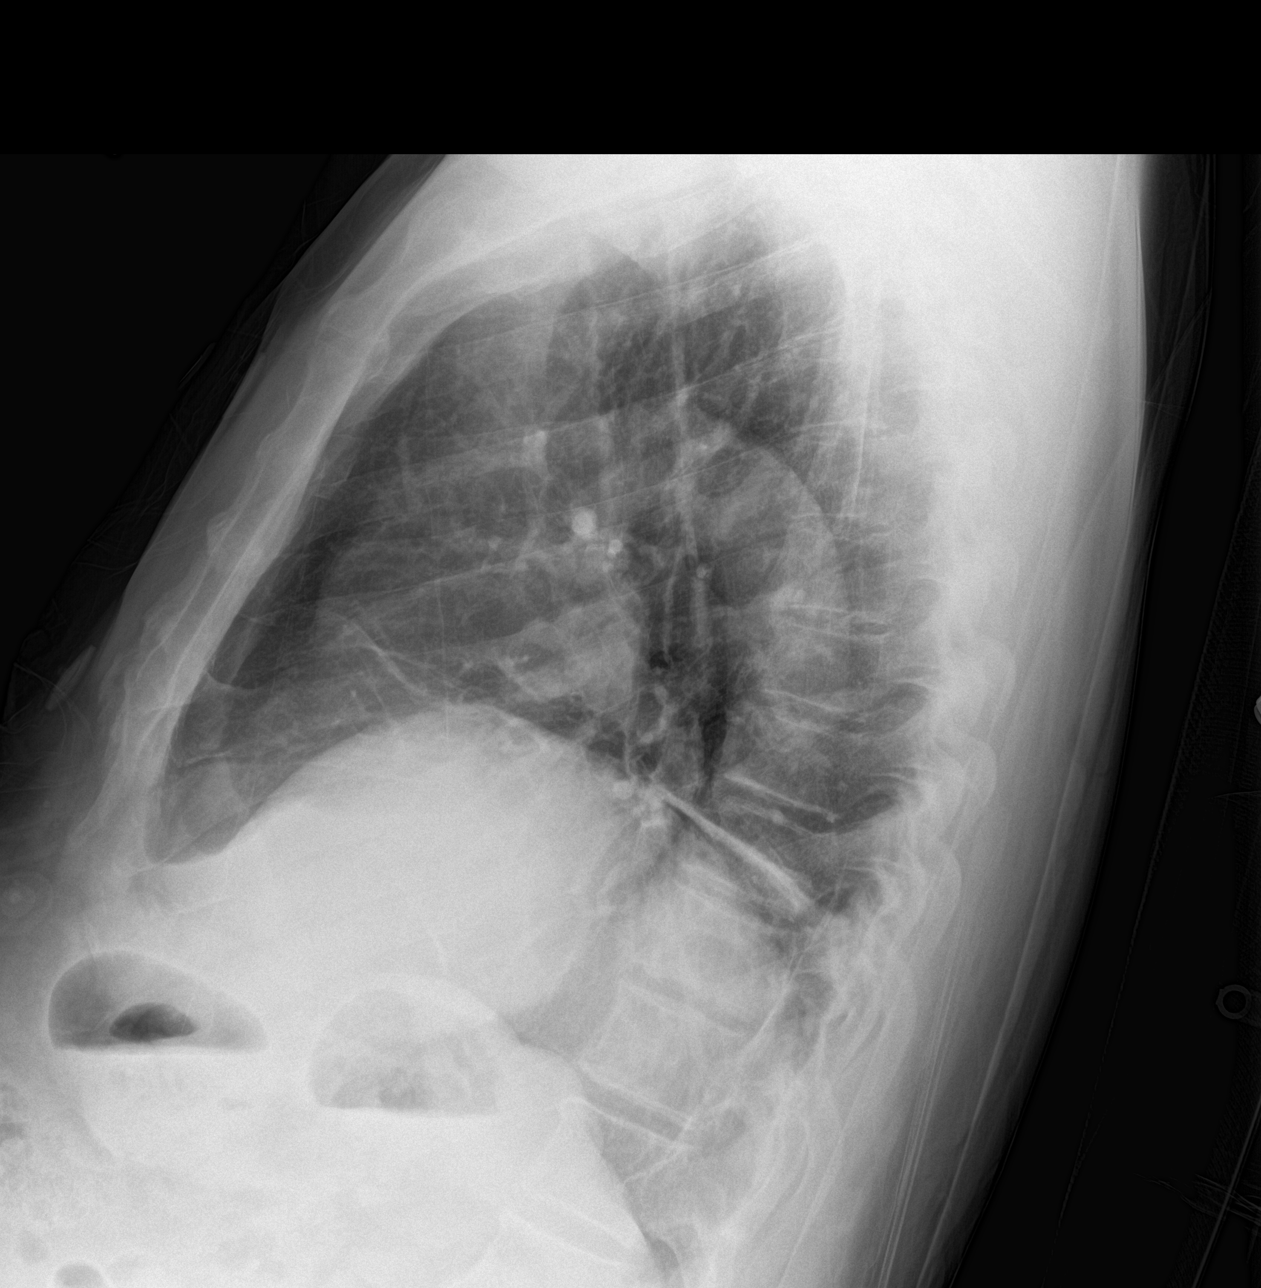

[chest ap (1 of 2)]
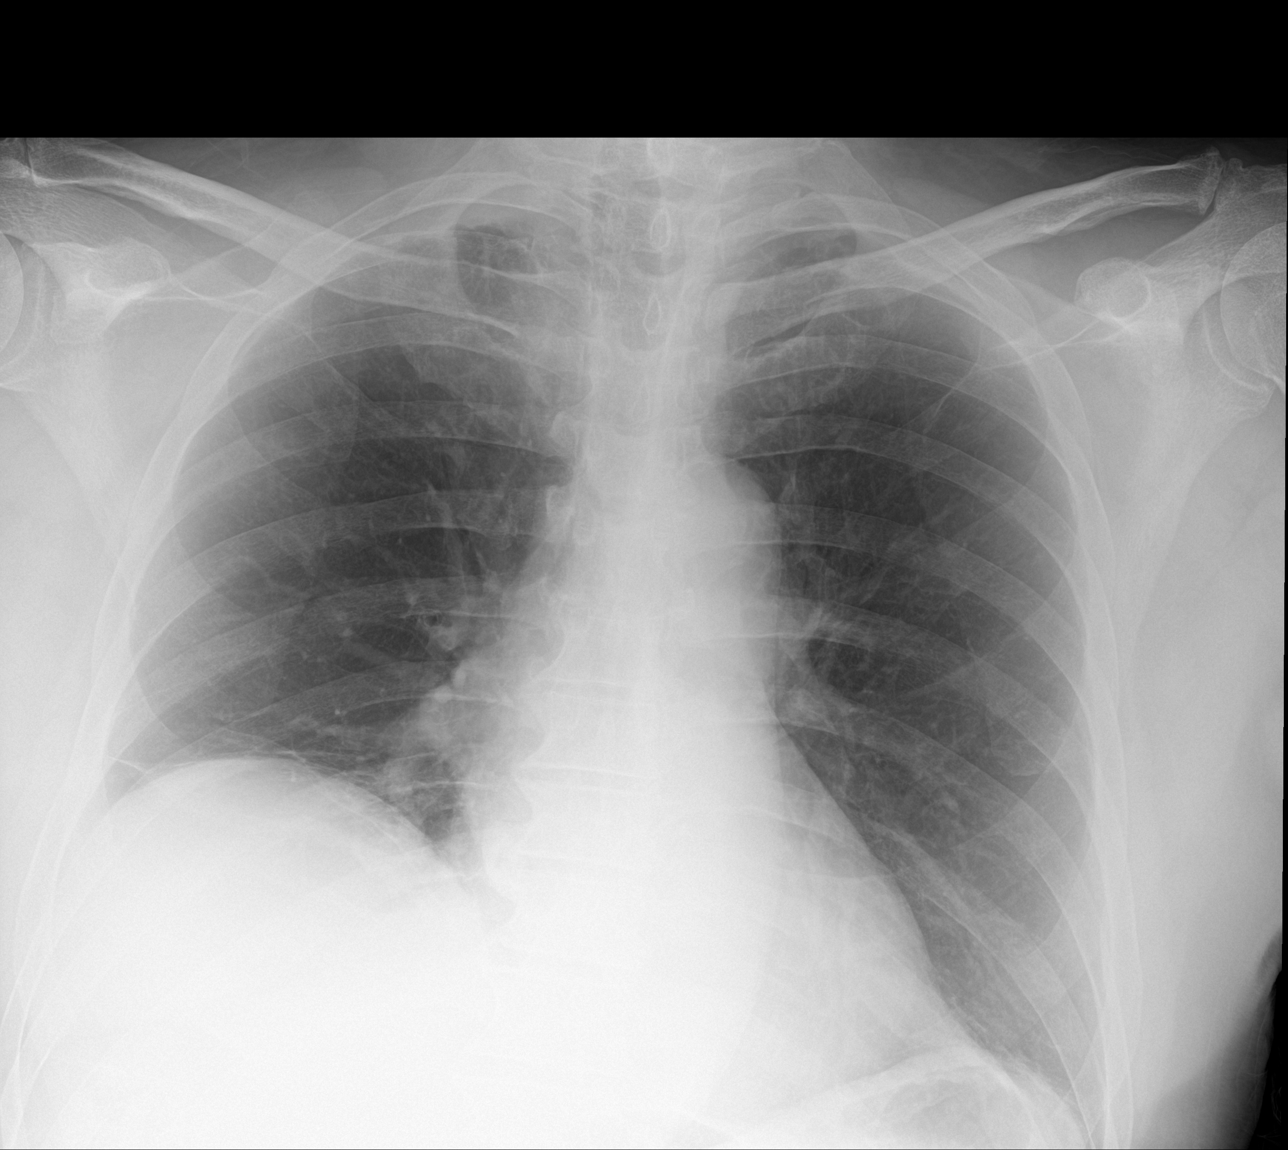

[chest ap (2 of 2)]
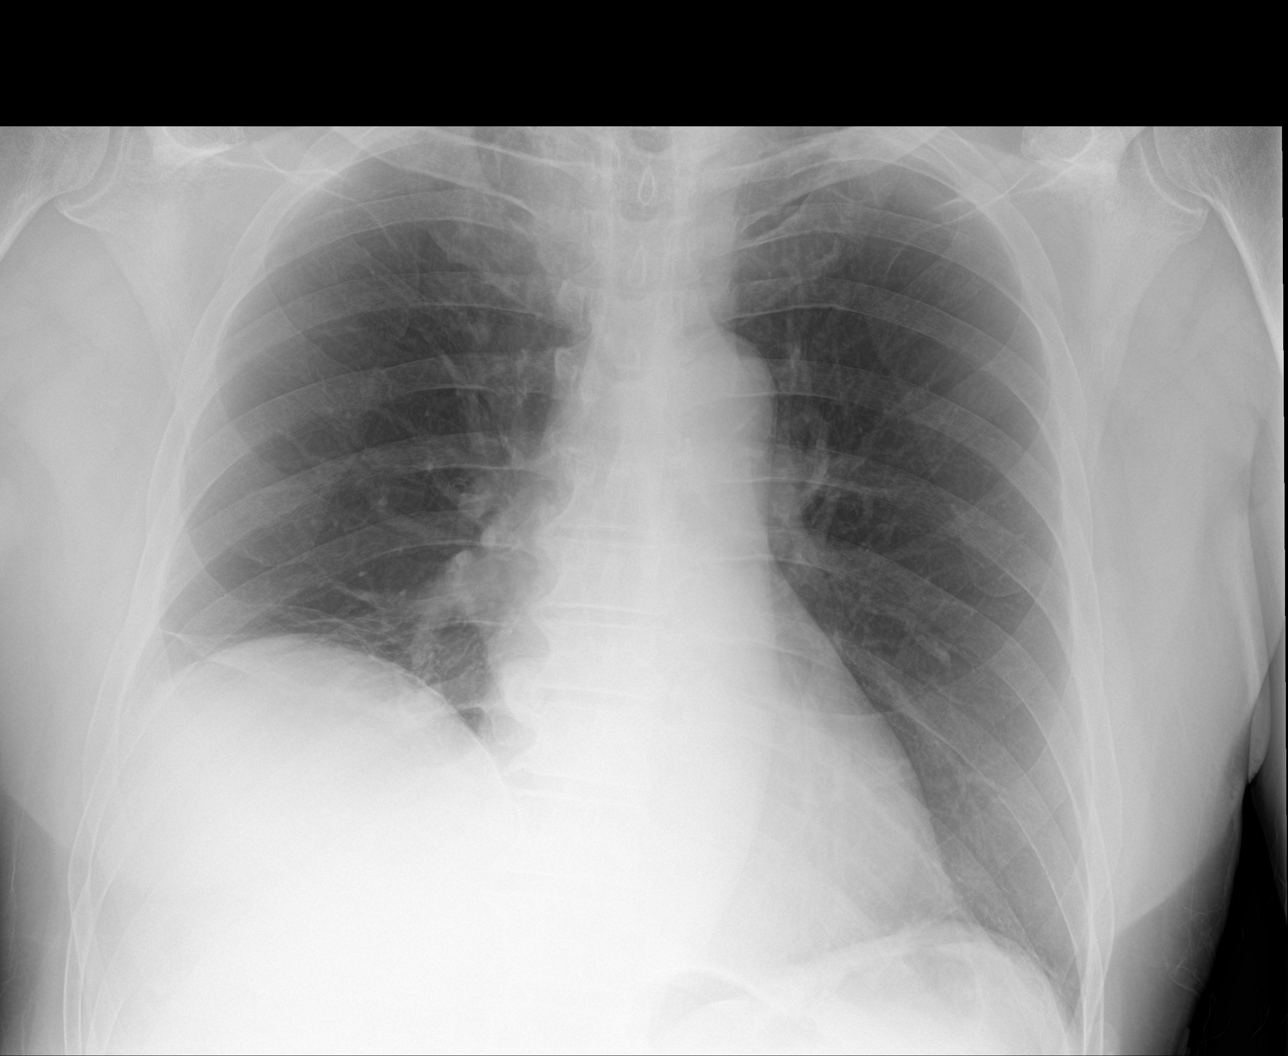

[3 of 3 positions shown; findings below may reference images not displayed]

FINDINGS: Elevation of the right hemidiaphragm with right base atelectasis. No
confluent opacity on the left. Biapical scarring, stable. Heart is
normal size. No effusions. No acute bony abnormality.
IMPRESSION: Elevated right hemidiaphragm with right base atelectasis.

## 2016-11-24 ENCOUNTER — Telehealth: Payer: Self-pay | Admitting: Pharmacist

## 2016-11-24 DIAGNOSIS — C61 Malignant neoplasm of prostate: Secondary | ICD-10-CM

## 2016-11-24 MED ORDER — ENZALUTAMIDE 40 MG PO CAPS: 160.0000 mg | ORAL_CAPSULE | Freq: Every day | ORAL | 0 refills | Status: DC

## 2016-11-24 NOTE — Telephone Encounter (Signed)
Oral Chemotherapy Pharmacist Encounter  Received new prescription for [facility] for the treatment of metastatic, castration-resistant prostate cancer.  09/22/16 CBC reviewed, OK for treatment. No metabolic panels in Epic since early 2017, noted increased SCr suggesting renal dysfunction, no dosage adjustments needed.  Current medication list in Epic assessed, no DDIs with Xtandi identified.  Noted patient with labile BPs, BP will continue to be monitored throughout treatment with Xtandi.  No prescription insurance coverage on file in [place]. Prescription has been e-scribed to [facility] for benefits analysis.  Oral oncology Clinic will continue to follow.   Johny Drilling, PharmD, BCPS, BCOP 11/24/2016  2:30 PM Oral Oncology Clinic [phone]

## 2016-11-29 NOTE — Telephone Encounter (Signed)
Oral Chemotherapy Pharmacist Encounter  Received notification from [place] that [facility] prescription would require prior authorization. The pharmacy did have some prescription insurance information on file, appears PBM is [facility]  PA submitted on CoverMyMeds Key [id number] Status is pending  Oral Oncology Clinic will continue to follow  Johny Drilling, PharmD, BCPS, BCOP 11/29/2016  9:00 AM Oral Oncology Clinic [phone]

## 2016-12-02 NOTE — Telephone Encounter (Signed)
Oral Chemotherapy Pharmacist Encounter  Xtandi prior authorization approval from [place] Effective dates: 10/09/16-11/28/16  I will alert [facility] to continue to process prescription.  Johny Drilling, PharmD, BCPS, BCOP 12/02/2016  1:04 PM Oral Oncology Clinic [phone]

## 2016-12-19 ENCOUNTER — Telehealth: Payer: Self-pay | Admitting: Pharmacist

## 2016-12-19 MED FILL — XTANDI 40 MG CAPSULE: 40 | 30 days supply | Qty: 120 | Fill #0

## 2016-12-19 NOTE — Telephone Encounter (Signed)
Oral Chemotherapy Pharmacist Encounter  Successfully enrolled patient for copayment assistance funds from [employer] [place] [employer] [place] (PAF) from the Prostate Cancer fund. Award amount: $3500 Effective dates: 12/19/16 - 12/19/17 ID: [id number] BIN: [id number] Group: [id number] PCN: [id number]  Billing information will be faxed to [facility]. I will place a copy of the award letter to be scanned into patient's chart.  Johny Drilling, PharmD, BCPS, BCOP 12/19/2016  4:36 PM Oral Oncology Clinic [phone]

## 2016-12-20 ENCOUNTER — Telehealth: Payer: Self-pay | Admitting: Medical Oncology

## 2016-12-20 NOTE — Telephone Encounter (Signed)
I attempted to reach [facility] and [facility]  regarding patients BP medication refills.  I was unable to reach anyone on the physician/ pharmacist line due to being  Disconnected with multiple attempts. I was able to reach the pharmacist thru switchboard to explain the [facility] times to reach someone in their  office for refill on  BP medication. The pharmacist is going to forward the [facility]ill to [facility]. He apologized and asked that the patient call the pharmacy directly at [phone] and they can assist him with refills.

## 2016-12-20 NOTE — Telephone Encounter (Signed)
Returned call to patient and son Fritz Pickerel to inform them that I was able to reach someone in the pharmacy at [place] to request refill on BP medication. I informed them per there pharmacist to call the pharmacy directly at [phone]. He voiced understanding.

## 2016-12-20 NOTE — Telephone Encounter (Signed)
Received a call from patient and son stating that they have been trying for 3 days to get a refill on patient's BP medication. I explained that they need to contact his PCP. Fritz Pickerel son states [facility] and [facility] stating they could not reach anyone in the office at [facility]. Fritz Pickerel states he was on hold for 20 minutes and no one ever answered. He is frustrated and asked if I could help. I offered to call the PCP office to see if I can reach someone. I will call patient back.

## 2016-12-22 ENCOUNTER — Other Ambulatory Visit: Payer: Self-pay | Admitting: Family Medicine

## 2016-12-22 NOTE — Telephone Encounter (Signed)
Oral Chemotherapy Pharmacist Encounter   I spoke with patient on 12/19/16 for overview of new oral chemotherapy medication: Xtandi. Pt is doing well. The prescriptions have been sent to the [facility] for benefit analysis and approval.   Copayment [phone] and we were able to enroll patient for some copayment grant funds so that he may start on his Zytiga.  Zytiga mailed from the [facility] on 12/20/16.  We will also start the manufacturer enrollment process for when copayment funds are depleted. Gregory [name] will sign assistance application at 1/51 MD office visit.  Counseled patient on administration, dosing, side effects, safe handling, and monitoring. Patient will take 4 tablets (160mg  total) by mouth once daily without regard to food. Side effects include but not limited to: hypertension, fatigue, GI upset, joint pain, and swelling.  Gregory [name] voiced understanding and appreciation.   All questions answered.  Oral Oncology Clinic will continue to follow.  Thank you,  Gregory [name], PharmD, BCPS, BCOP 12/22/2016  2:13 PM Oral Oncology Clinic [phone]

## 2016-12-23 ENCOUNTER — Encounter: Payer: Self-pay | Admitting: Family Medicine

## 2016-12-23 ENCOUNTER — Ambulatory Visit (INDEPENDENT_AMBULATORY_CARE_PROVIDER_SITE_OTHER): Payer: Medicare Other | Admitting: Family Medicine

## 2016-12-23 VITALS — BP 140/100 | HR 92 | Temp 98.4°F | Resp 14 | Ht 72.0 in | Wt 205.0 lb

## 2016-12-23 DIAGNOSIS — C7951 Secondary malignant neoplasm of bone: Secondary | ICD-10-CM | POA: Diagnosis not present

## 2016-12-23 DIAGNOSIS — I10 Essential (primary) hypertension: Secondary | ICD-10-CM | POA: Diagnosis not present

## 2016-12-23 DIAGNOSIS — Z9221 Personal history of antineoplastic chemotherapy: Secondary | ICD-10-CM

## 2016-12-23 DIAGNOSIS — Z130 Encounter for screening for diseases of the blood and blood-forming organs and certain disorders involving the immune mechanism: Secondary | ICD-10-CM | POA: Diagnosis not present

## 2016-12-23 DIAGNOSIS — R7309 Other abnormal glucose: Secondary | ICD-10-CM | POA: Diagnosis not present

## 2016-12-23 DIAGNOSIS — C61 Malignant neoplasm of prostate: Secondary | ICD-10-CM

## 2016-12-23 LAB — CBC WITH DIFFERENTIAL/PLATELET
BASOS PCT: 0 %
Basophils Absolute: 0 cells/uL (ref 0–200)
Eosinophils Absolute: 320 cells/uL (ref 15–500)
Eosinophils Relative: 4 %
HEMATOCRIT: 39.5 % (ref 38.5–50.0)
Hemoglobin: 13.2 g/dL (ref 13.2–17.1)
LYMPHS ABS: 2400 {cells}/uL (ref 850–3900)
LYMPHS PCT: 30 %
MCH: 29.7 pg (ref 27.0–33.0)
MCHC: 33.4 g/dL (ref 32.0–36.0)
MCV: 89 fL (ref 80.0–100.0)
MONO ABS: 640 {cells}/uL (ref 200–950)
MPV: 9.8 fL (ref 7.5–12.5)
Monocytes Relative: 8 %
NEUTROS ABS: 4640 {cells}/uL (ref 1500–7800)
Neutrophils Relative %: 58 %
Platelets: 200 10*3/uL (ref 140–400)
RBC: 4.44 MIL/uL (ref 4.20–5.80)
RDW: 14 % (ref 11.0–15.0)
WBC: 8 10*3/uL (ref 3.8–10.8)

## 2016-12-23 LAB — COMPLETE METABOLIC PANEL WITH GFR
ALBUMIN: 4.5 g/dL (ref 3.6–5.1)
ALK PHOS: 63 U/L (ref 40–115)
ALT: 11 U/L (ref 9–46)
AST: 16 U/L (ref 10–35)
BILIRUBIN TOTAL: 0.5 mg/dL (ref 0.2–1.2)
BUN: 17 mg/dL (ref 7–25)
CALCIUM: 10 mg/dL (ref 8.6–10.3)
CO2: 28 mmol/L (ref 20–31)
CREATININE: 1.24 mg/dL — AB (ref 0.70–1.11)
Chloride: 105 mmol/L (ref 98–110)
GFR, Est African American: 61 mL/min (ref >=60)
GFR, Est Non African American: 53 mL/min — ABNORMAL LOW (ref >=60)
Glucose, Bld: 116 mg/dL — ABNORMAL HIGH (ref 65–99)
Potassium: 4.7 mmol/L (ref 3.5–5.3)
Sodium: 143 mmol/L (ref 135–146)
Total Protein: 7 g/dL (ref 6.1–8.1)

## 2016-12-23 LAB — TSH: TSH: 1.82 mIU/L (ref 0.40–4.50)

## 2016-12-23 LAB — POCT URINALYSIS DIP (DEVICE)
Bilirubin Urine: NEGATIVE
Glucose, UA: NEGATIVE mg/dL
Hgb urine dipstick: NEGATIVE
KETONES UR: NEGATIVE mg/dL
Leukocytes, UA: NEGATIVE
Nitrite: NEGATIVE
PROTEIN: NEGATIVE mg/dL
SPECIFIC GRAVITY, URINE: 1.025 (ref 1.005–1.030)
Urobilinogen, UA: 0.2 mg/dL (ref 0.0–1.0)
pH: 6 (ref 5.0–8.0)

## 2016-12-23 MED ORDER — METOPROLOL SUCCINATE ER 100 MG PO TB24: 100.0000 mg | ORAL_TABLET | Freq: Every day | ORAL | 1 refills | Status: DC

## 2016-12-23 NOTE — Progress Notes (Signed)
Patient ID: Gregory [name], male    DOB: 19-Sep-1928, 83 y.o.   MRN: [id number]  PCP: [name], FNP  Chief Complaint  Patient presents with  . Follow-up    BLOD PRESSURE  . Medication Refill    TOPROL    Subjective:  HPI Gregory [name] is a 83 y.o. male presents To establish care and for blood pressure follow-up.  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 He is currently been followed at the [facility] and undergoing chemotherapy for prostate cancer. He also has chronic urinary retention and acute kidney injury which has been followed by [facility]. He has a history of a TURP procedure in 2017. This prostate cancer has metastasized to response. He reports that he has been stable on chemotherapy and denies any side effects associated with his current therapy regimen. He was last seen here at the clinic back in 2016. He presents today just to get a refill on his metoprolol which he takes chronically for hypertension.  He denies any other complaints today. Social History   Social History  . Marital status: Divorced    Spouse name: N/A  . Number of children: N/A  . Years of education: N/A   Occupational History  . Retired    Social History Main Topics  . Smoking status: Former Smoker    Packs/day: 0.25    Years: 30.00    Types: Cigarettes    Start date: 07/11/1992  . Smokeless tobacco: Never Used  . Alcohol use No  . Drug use: No  . Sexual activity: No   Other Topics Concern  . Not on file   Social History Narrative  . No narrative on file    Family History  Problem Relation Age of Onset  . Tuberculosis Mother 72  . Other Father 75     Review of Systems See history of present illness Patient Active Problem List   Diagnosis Date Noted  . Difficult intravenous access   .  Elevated troponin 09/01/2015  . Diastolic dysfunction with acute on chronic heart failure ([place]) 09/01/2015  . AKI (acute kidney injury) ([place]) 08/30/2015  . Hyperkalemia 08/30/2015  . Prostate cancer ([place]) 08/29/2015  . Hematuria   . S/P TURP   . Urinary retention   . Metastatic adenocarcinoma to bone ([place]) 08/21/2015  . Essential hypertension 04/14/2015  . Malignant neoplasm prostate ([place]) 04/14/2015  . Hyperglycemia 04/14/2015  . Other fatigue 04/14/2015    No Known Allergies  Prior to Admission medications   Medication Sig Start Date End Date Taking? Authorizing Provider  alendronate (FOSAMAX) 70 MG tablet Take 70 mg by mouth once a week. Take with a full glass of water on an empty stomach.   Yes [provider]  enzalutamide Gillermina Phy) 40 MG capsule Take 4 capsules (160 mg total) by mouth daily. 11/24/16  Yes Wyatt Portela, MD  metoprolol succinate (TOPROL XL) 100 MG 24 hr tablet Take 1 tablet (100 mg total) by mouth daily. Take with or immediately following a meal. 06/08/16  Yes Micheline Chapman, NP  Multiple Vitamins-Minerals (MULTIVITAMIN WITH MINERALS) tablet Take 1 tablet by mouth every morning.    Yes [provider]  polyethylene glycol (MIRALAX / GLYCOLAX) packet Take 17 g by mouth daily as needed for mild constipation. Patient not taking: Reported on 12/23/2016 09/05/15   Geradine Girt, DO  Past Medical, Surgical Family and Social History reviewed and updated.    Objective:   Today's Vitals   12/23/16 1405  BP: (!) 140/100  Pulse: 92  Resp: 14  Temp: 98.4 F (36.9 C)  TempSrc: Oral  SpO2: 100%  Weight: 205 lb (93 kg)  Height: 6' (1.829 m)    Wt Readings from Last 3 Encounters:  12/23/16 205 lb (93 kg)  11/17/16 208 lb 3 oz (94.4 kg)  11/03/16 209 lb 6.4 oz (95 kg)    Physical Exam  Constitutional: He is oriented to person, place, and time. He appears well-developed and well-nourished.  HENT:  Head: Normocephalic and atraumatic.   Eyes: Conjunctivae and EOM are normal. Pupils are equal, round, and reactive to light.  Neck: Normal range of motion. Neck supple. No thyromegaly present.  Cardiovascular: Normal rate, regular rhythm, normal heart sounds and intact distal pulses.   Pulmonary/Chest: Effort normal and breath sounds normal.  Abdominal: Soft. Bowel sounds are normal. He exhibits no distension.  Musculoskeletal: Normal range of motion.  Neurological: He is alert and oriented to person, place, and time.  Skin: Skin is warm and dry.  Psychiatric: He has a normal mood and affect. His behavior is normal. Judgment and thought content normal.   Assessment & Plan:  1. Essential hypertension, Controlled today - COMPLETE METABOLIC PANEL WITH GFR - TSH -Continue metoprolol 100 mg daily. 2. Malignant neoplasm prostate (HCC) - CBC with Differential -Continue all follow-up appointments at the cancer center.  3. Screening for deficiency anemia -Check CBC.  4. History of cancer chemotherapy - CBC with Differential  5. Elevated glucose - Hemoglobin A1c -Patient has had multiple episodes of elevated glucose screening today for 2 diabetes.  Return to care in 3 months for hypertension follow-up.  [name]. Kenton Kingfisher, MSN, FNP-C [facility]  [address] [place],  [address] [phone]

## 2016-12-24 LAB — HEMOGLOBIN A1C
Hgb A1c MFr Bld: 5.7 % — ABNORMAL HIGH (ref <5.7)
MEAN PLASMA GLUCOSE: 117 mg/dL

## 2016-12-30 ENCOUNTER — Encounter: Payer: Self-pay | Admitting: Oncology

## 2016-12-30 ENCOUNTER — Other Ambulatory Visit (HOSPITAL_BASED_OUTPATIENT_CLINIC_OR_DEPARTMENT_OTHER): Payer: Medicare Other

## 2016-12-30 ENCOUNTER — Ambulatory Visit (HOSPITAL_BASED_OUTPATIENT_CLINIC_OR_DEPARTMENT_OTHER): Payer: Medicare Other | Admitting: Oncology

## 2016-12-30 ENCOUNTER — Telehealth: Payer: Self-pay | Admitting: Oncology

## 2016-12-30 VITALS — BP 130/69 | HR 117 | Temp 98.5°F | Resp 18 | Ht 72.0 in | Wt 207.5 lb

## 2016-12-30 DIAGNOSIS — E291 Testicular hypofunction: Secondary | ICD-10-CM | POA: Diagnosis not present

## 2016-12-30 DIAGNOSIS — C61 Malignant neoplasm of prostate: Secondary | ICD-10-CM | POA: Diagnosis not present

## 2016-12-30 DIAGNOSIS — C7951 Secondary malignant neoplasm of bone: Secondary | ICD-10-CM | POA: Diagnosis not present

## 2016-12-30 LAB — CBC WITH DIFFERENTIAL/PLATELET
BASO%: 0.2 % (ref 0.0–2.0)
Basophils Absolute: 0 10*3/uL (ref 0.0–0.1)
EOS ABS: 0.3 10*3/uL (ref 0.0–0.5)
EOS%: 3.9 % (ref 0.0–7.0)
HEMATOCRIT: 39.9 % (ref 38.4–49.9)
HEMOGLOBIN: 13.1 g/dL (ref 13.0–17.1)
LYMPH#: 1.7 10*3/uL (ref 0.9–3.3)
LYMPH%: 25.3 % (ref 14.0–49.0)
MCH: 30.1 pg (ref 27.2–33.4)
MCHC: 32.8 g/dL (ref 32.0–36.0)
MCV: 91.7 fL (ref 79.3–98.0)
MONO#: 0.5 10*3/uL (ref 0.1–0.9)
MONO%: 7.9 % (ref 0.0–14.0)
NEUT%: 62.7 % (ref 39.0–75.0)
NEUTROS ABS: 4.1 10*3/uL (ref 1.5–6.5)
PLATELETS: 197 10*3/uL (ref 140–400)
RBC: 4.35 10*6/uL (ref 4.20–5.82)
RDW: 14.2 % (ref 11.0–14.6)
WBC: 6.6 10*3/uL (ref 4.0–10.3)

## 2016-12-30 LAB — COMPREHENSIVE METABOLIC PANEL
ALBUMIN: 4.1 g/dL (ref 3.5–5.0)
ALK PHOS: 67 U/L (ref 40–150)
ALT: 16 U/L (ref 0–55)
ANION GAP: 12 meq/L — AB (ref 3–11)
AST: 17 U/L (ref 5–34)
BILIRUBIN TOTAL: 0.61 mg/dL (ref 0.20–1.20)
BUN: 12.9 mg/dL (ref 7.0–26.0)
CO2: 24 meq/L (ref 22–29)
CREATININE: 1.2 mg/dL (ref 0.7–1.3)
Calcium: 10.2 mg/dL (ref 8.4–10.4)
Chloride: 106 mEq/L (ref 98–109)
EGFR: 67 mL/min/{1.73_m2} — AB (ref >90)
Glucose: 133 mg/dl (ref 70–140)
Potassium: 3.8 mEq/L (ref 3.5–5.1)
Sodium: 142 mEq/L (ref 136–145)
TOTAL PROTEIN: 7.8 g/dL (ref 6.4–8.3)

## 2016-12-30 NOTE — Progress Notes (Signed)
Patient came in with his approval letter for PAF and a bill. Reviewed bill and advised patient that bill was related to his hospital account and test he had done on that side and shoed him the test and date of service.  Patient was buzzed for lab. Will make copies of PAF paperwork when he returns and place in my Copay Assistance book.

## 2016-12-30 NOTE — Progress Notes (Signed)
Hematology and Oncology Follow Up Visit  Gregory [name] [id number] 1928/09/10 83 y.o. 12/30/2016 1:59 PM [name], FNPHollis, Asencion Partridge, FNP   Principle Diagnosis: 83 year old gentleman with castration resistant prostate cancer with metastatic disease to the bone. He is initial diagnosis was in 2016 when he presented with a PSA of 18.7 and a Gleason score 4+5 = 9. This was in January 2017.   Prior Therapy: He is status post TURP procedure and orchiectomy in February 2017. He also received prophylactic radiation to the right femoral hip for a total of 30 gray in 10 fractions in the care of Dr. Tammi Klippel completed in April 2017.  His PSA started to rise again with the PSA of 2.94 in July 2017. He received Xofigo for a total of 6 treatments completed in March 2018. PSA was 5.15 in 10/26/2016 with castrate level testosterone. .   Current therapy: Xtandi 160 mg daily started in the immediate future.  Interim History: Gregory [name] presents today for a follow-up visit. Since the last visit, he reports no major changes in his health. He received Xtandi but has not started it yet. He denied any recent back pain, joint pain or pathological fractures. He continues to attend to her activities of daily living and ambulates without any difficulties. He was using public transportation to make it to his appointment at this time.  He is not reporting any headaches, blurry vision, syncope or seizures. He does not report any fevers, chills, sweats or weight loss. He does not report any chest pain, palpitation, orthopnea or leg edema. He is not report any cough, wheezing or hemoptysis. He is not reporting nausea, vomiting or abdominal pain. He is not reporting frequency urgency or hesitancy. He does not report any hematuria or dysuria. He does not report any skeletal complaints. Remaining review of systems unremarkable.    Medications: I have reviewed the patient's current medications.  Current Outpatient  Prescriptions  Medication Sig Dispense Refill  . alendronate (FOSAMAX) 70 MG tablet Take 70 mg by mouth once a week. Take with a full glass of water on an empty stomach.    . enzalutamide (XTANDI) 40 MG capsule Take 4 capsules (160 mg total) by mouth daily. 120 capsule 0  . metoprolol succinate (TOPROL XL) 100 MG 24 hr tablet Take 1 tablet (100 mg total) by mouth daily. Take with or immediately following a meal. 90 tablet 1  . Multiple Vitamins-Minerals (MULTIVITAMIN WITH MINERALS) tablet Take 1 tablet by mouth every morning.     . polyethylene glycol (MIRALAX / GLYCOLAX) packet Take 17 g by mouth daily as needed for mild constipation. (Patient not taking: Reported on 12/23/2016) 14 each 0   No current facility-administered medications for this visit.      Allergies: No Known Allergies  Past Medical History, Surgical history, Social history, and Family History were reviewed and updated.  Physical Exam: Blood pressure 130/69, pulse (!) 117, temperature 98.5 F (36.9 C), temperature source Oral, resp. rate 18, height 6' (1.829 m), weight 207 lb 8 oz (94.1 kg), SpO2 100 %. ECOG: 1 General appearance: alert and cooperative Head: Normocephalic, without obvious abnormality Neck: no adenopathy Lymph nodes: Cervical, supraclavicular, and axillary nodes normal. Heart:regular rate and rhythm, S1, S2 normal, no murmur, click, rub or gallop Lung:chest clear, no wheezing, rales, normal symmetric air entry, Heart exam - S1, S2 normal, no murmur, no gallop, rate regular Abdomin: soft, non-tender, without masses or organomegaly EXT:no erythema, induration, or nodules   Lab Results:  Lab Results  Component Value Date   WBC 6.6 12/30/2016   HGB 13.1 12/30/2016   HCT 39.9 12/30/2016   MCV 91.7 12/30/2016   PLT 197 12/30/2016     Chemistry      Component Value Date/Time   NA 143 12/23/2016 1444   K 4.7 12/23/2016 1444   CL 105 12/23/2016 1444   CO2 28 12/23/2016 1444   BUN 17 12/23/2016 1444    CREATININE 1.24 (H) 12/23/2016 1444      Component Value Date/Time   CALCIUM 10.0 12/23/2016 1444   ALKPHOS 63 12/23/2016 1444   AST 16 12/23/2016 1444   ALT 11 12/23/2016 1444   BILITOT 0.5 12/23/2016 1444       Impression and Plan:  83 year old gentleman with the following issues:  1. Castration resistant prostate cancer with metastatic disease to the bone. He is initial diagnosis was in 2017 when he presented with a PSA of 18.7 and a Gleason score 4+5 = 9. He developed advanced bony metastasis from presentation. He was treated with bilateral orchiectomy and TURP in January 2017.  He also received palliative radiation therapy to the right femur in April 2017 and completed 6 treatments of Xofigo in March 2018. PSA in April 2018 increased to 5.15 from 2.32 in May 2017.  He is currently to start Xtandi 160 mg daily. He received the medication but has not started it yet. Complications associated with this medication were reviewed today which include fatigue, tiredness and edema. Hypertension and seizures and I will also been noted.  After discussion today he is agreeable to proceed. I clarified for him how to take this medication and has no ambiguous year questions regarding this issue.  2. Androgen depravation: He is status post bilateral orchiectomy.  3. Bone directed therapy: He would be a good candidate for Xgeva in the future. We will address that with him once he is taking Xtandi regularly without complications.  4. Prognosis: He has an incurable malignancy is still has a good performance status. Palliative therapy is warranted at this time.  5. Follow-up: In the next 4-6 weeks following his progress.    Zola Button, MD 6/22/20181:59 PM

## 2016-12-30 NOTE — Telephone Encounter (Signed)
Scheduled appt per 6/22 los. - Gave patient AVS and calender per LOS.

## 2016-12-31 LAB — PSA: Prostate Specific Ag, Serum: 7.9 ng/mL — ABNORMAL HIGH (ref 0.0–4.0)

## 2017-01-05 ENCOUNTER — Encounter (HOSPITAL_COMMUNITY): Payer: Self-pay | Admitting: *Deleted

## 2017-01-05 ENCOUNTER — Emergency Department (HOSPITAL_COMMUNITY)
Admission: EM | Admit: 2017-01-05 | Discharge: 2017-01-05 | Disposition: A | Discharge status: Home / Self Care | Payer: Medicare Other | Attending: Emergency Medicine | Admitting: Emergency Medicine

## 2017-01-05 DIAGNOSIS — Z8546 Personal history of malignant neoplasm of prostate: Secondary | ICD-10-CM | POA: Insufficient documentation

## 2017-01-05 DIAGNOSIS — I5033 Acute on chronic diastolic (congestive) heart failure: Secondary | ICD-10-CM | POA: Insufficient documentation

## 2017-01-05 DIAGNOSIS — Z79899 Other long term (current) drug therapy: Secondary | ICD-10-CM | POA: Insufficient documentation

## 2017-01-05 DIAGNOSIS — I110 Hypertensive heart disease with heart failure: Secondary | ICD-10-CM | POA: Insufficient documentation

## 2017-01-05 DIAGNOSIS — R339 Retention of urine, unspecified: Secondary | ICD-10-CM | POA: Diagnosis present

## 2017-01-05 DIAGNOSIS — Z87891 Personal history of nicotine dependence: Secondary | ICD-10-CM | POA: Insufficient documentation

## 2017-01-05 LAB — URINALYSIS, ROUTINE W REFLEX MICROSCOPIC
Bacteria, UA: NONE SEEN
Bilirubin Urine: NEGATIVE
Glucose, UA: NEGATIVE mg/dL
Ketones, ur: NEGATIVE mg/dL
Leukocytes, UA: NEGATIVE
Nitrite: NEGATIVE
Protein, ur: NEGATIVE mg/dL
Specific Gravity, Urine: 1.004 — ABNORMAL LOW (ref 1.005–1.030)
pH: 6 (ref 5.0–8.0)

## 2017-01-05 NOTE — ED Provider Notes (Signed)
[facility] DEPT Provider Note   CSN: [id number] Arrival date & time: 01/05/17  1448     History   Chief Complaint Chief Complaint  Patient presents with  . Urinary Retention    HPI Gregory [name] is a 83 y.o. male.  Patients with history of prostate cancer status post surgery and TURP procedure, followed at [facility] -- presents with acute onset of urinary retention. Patient became very uncomfortable after drinking a lot of water. He was unable to urinate. He complains of urge to urinate and lower abdominal pain. Patient was treated in emergency department with Foley catheter with near resolution of symptoms. Patient states he had had one previous episode of urinary retention. No recent urinary symptoms. No fever, nausea, vomiting, or diarrhea. Patient had labs recently with normal kidney function demonstrated on 12/30/16.      Past Medical History:  Diagnosis Date  . Bone metastases ([place])   . BPH (benign prostatic hyperplasia)   . Foley catheter in place    Urinary Retension  . GERD (gastroesophageal reflux disease)   . Hypertension   . Prostate cancer [facility])   urologist- dr dahlstedt/  oncologist-  dr manning    Gleason 4+5,  PSA 18.74,  w/  Visceral and Extensive Bones METS--  pallitive radiation therapy and hormone therapy    Patient Active Problem List   Diagnosis Date Noted  . Difficult intravenous access   . Elevated troponin 09/01/2015  . Diastolic dysfunction with acute on chronic heart failure ([place]) 09/01/2015  . AKI (acute kidney injury) ([place]) 08/30/2015  . Hyperkalemia 08/30/2015  . Prostate cancer ([place]) 08/29/2015  . Hematuria   . S/P TURP   . Urinary retention   . Metastatic adenocarcinoma to bone ([place]) 08/21/2015  . Essential hypertension 04/14/2015  . Malignant neoplasm prostate ([place]) 04/14/2015  . Hyperglycemia 04/14/2015  . Other fatigue 04/14/2015    Past Surgical History:  Procedure Laterality Date  . MULTIPLE TOOTH EXTRACTIONS   1980's  . ORCHIECTOMY Bilateral 08/27/2015   Procedure: ORCHIECTOMY;  Surgeon: Franchot Gallo, MD;  Location[facility];  Service: Urology;  Laterality: Bilateral;  . PROSTATE BIOPSY  1/16 /2017  . TRANSURETHRAL RESECTION OF PROSTATE N/A 08/27/2015   Procedure: TRANSURETHRAL RESECTION OF THE PROSTATE WITH GYRUS INSTRUMENTS;  Surgeon: Franchot Gallo, MD;  Location[facility];  Service: Urology;  Laterality: N/A;       Home Medications    Prior to Admission medications   Medication Sig Start Date End Date Taking? Authorizing Provider  alendronate (FOSAMAX) 70 MG tablet Take 70 mg by mouth once a week. Take with a full glass of water on an empty stomach.    [provider]  enzalutamide Gillermina Phy) 40 MG capsule Take 4 capsules (160 mg total) by mouth daily. 11/24/16   Wyatt Portela, MD  metoprolol succinate (TOPROL XL) 100 MG 24 hr tablet Take 1 tablet (100 mg total) by mouth daily. Take with or immediately following a meal. 12/23/16   [name], FNP  Multiple Vitamins-Minerals (MULTIVITAMIN WITH MINERALS) tablet Take 1 tablet by mouth every morning.     [provider]  polyethylene glycol (MIRALAX / GLYCOLAX) packet Take 17 g by mouth daily as needed for mild constipation. Patient not taking: Reported on 12/23/2016 09/05/15   Geradine Girt, DO    Family History Family History  Problem Relation Age of Onset  . Tuberculosis Mother 61  . Other Father 61    Social  History Social History  Substance Use Topics  . Smoking status: Former Smoker    Packs/day: 0.25    Years: 30.00    Types: Cigarettes    Start date: 07/11/1992  . Smokeless tobacco: Never Used  . Alcohol use No     Allergies   Patient has no known allergies.   Review of Systems Review of Systems  Constitutional: Negative for fever.  HENT: Negative for rhinorrhea and sore throat.   Eyes: Negative for redness.  Respiratory: Negative for cough.     Cardiovascular: Negative for chest pain.  Gastrointestinal: Positive for abdominal pain. Negative for diarrhea, nausea and vomiting.  Genitourinary: Positive for difficulty urinating. Negative for dysuria and frequency.  Musculoskeletal: Negative for myalgias.  Skin: Negative for rash.  Neurological: Negative for headaches.     Physical Exam Updated Vital Signs BP 124/77 (BP Location: Left Arm)   Pulse 87   Temp 98.6 F (37 C) (Oral)   Resp 18   Ht 6' (1.829 m)   Wt 93.9 kg (207 lb)   SpO2 100%   BMI 28.07 kg/m   Physical Exam  Constitutional: He appears well-developed and well-nourished.  HENT:  Head: Normocephalic and atraumatic.  Mouth/Throat: Oropharynx is clear and moist.  Eyes: Conjunctivae are normal. Right eye exhibits no discharge. Left eye exhibits no discharge.  Neck: Normal range of motion. Neck supple.  Cardiovascular: Normal rate, regular rhythm and normal heart sounds.   Pulmonary/Chest: Effort normal and breath sounds normal.  Abdominal: Soft. There is no tenderness.  Genitourinary: Penis normal.  Genitourinary Comments: Foley catheter in place.  Neurological: He is alert.  Skin: Skin is warm and dry.  Psychiatric: He has a normal mood and affect.  Nursing note and vitals reviewed.    ED Treatments / Results  Labs (all labs ordered are listed, but only abnormal results are displayed) Labs Reviewed  URINALYSIS, ROUTINE W REFLEX MICROSCOPIC - Abnormal; Notable for the following:       Result Value   Color, Urine STRAW (*)    Specific Gravity, Urine 1.004 (*)    Hgb urine dipstick MODERATE (*)    Squamous Epithelial / LPF 0-5 (*)    All other components within normal limits    Procedures Procedures (including critical care time)  Medications Ordered in ED Medications - No data to display   Initial Impression / Assessment and Plan / ED Course  I have reviewed the triage vital signs and the nursing notes.  Pertinent labs & imaging results  that were available during my care of the patient were reviewed by me and considered in my medical decision making (see chart for details).     Patient seen and examined. UA and previous labs reviewed.  Patient discussed with Dr. Ellender Hose.  Vital signs reviewed and are as follows: BP 124/77 (BP Location: Left Arm)   Pulse 87   Temp 98.6 F (37 C) (Oral)   Resp 18   Ht 6' (1.829 m)   Wt 93.9 kg (207 lb)   SpO2 100%   BMI 28.07 kg/m   Encouraged follow-up with urologist in the next 1 week. Return with new symptoms, worsening symptoms or other concerns. Patient and family verbalized understanding. Will discharge home with leg bag.   Final Clinical Impressions(s) / ED Diagnoses   Final diagnoses:  Urinary retention   Patient with acute urinary retention, urine without signs of infection. Patient currently asymptomatic. Low concern for post renal acute renal failure.  New Prescriptions  New Prescriptions   No medications on file       Carlisle Cater, Hershal Coria 01/05/17 1746    Duffy Bruce, MD 01/06/17 [phone]

## 2017-01-05 NOTE — Discharge Instructions (Signed)
Please read and follow all provided instructions.  Your diagnoses today include:  1. Urinary retention     Tests performed today include:  Urine test - no infection  Vital signs. See below for your results today.   Medications prescribed:   None  Take any prescribed medications only as directed.  Home care instructions:  Follow any educational materials contained in this packet.  Follow-up instructions: Please follow-up with Dr. Eulogio Ditch in the next week.   Return instructions:   Please return to the Emergency Department if you experience worsening symptoms.   Please return if you have any other emergent concerns.  Additional Information:  Your vital signs today were: BP 124/77 (BP Location: Left Arm)    Pulse 87    Temp 98.6 F (37 C) (Oral)    Resp 18    Ht 6' (1.829 m)    Wt 93.9 kg (207 lb)    SpO2 100%    BMI 28.07 kg/m  If your blood pressure (BP) was elevated above 135/85 this visit, please have this repeated by your doctor within one month. --------------

## 2017-01-05 NOTE — ED Triage Notes (Signed)
Patient is alert and oriented x4.  He is being seen for urinary retention that started today.  Patient has had a Hx a TURP and has had no issues until today.  Currently he rates his pain 10 of 10.

## 2017-01-05 NOTE — Telephone Encounter (Signed)
Oral Oncology Patient Advocate Encounter  Met patient in waiting room to complete application for [employer] in an effort to reduce patient's out of pocket expense for Xtandi to $0.    Application completed and faxed to [phone] .   [employer] patient assistance phone number for follow up is [phone].   This encounter will be updated until final determination.   Gregory [name]. Melynda Keller, [place] [facility] [phone] 01/05/2017 2:32 PM

## 2017-01-09 ENCOUNTER — Telehealth: Payer: Self-pay | Admitting: Pharmacy Technician

## 2017-01-09 NOTE — Telephone Encounter (Signed)
Oral Oncology Patient Advocate Encounter  Received notification from [employer] that Mr. Wendell has been successfully enrolled into their program to receive Xtandi at $0 out-of-pocket from the manufacturer until 07/10/2017.  I called and spoke with patient and family.   He knows we will have to re-apply after the end of the year.  Patient knows to call the office with questions or concerns.  Oral Oncology Clinic will continue to follow.  Gilmore Laroche, CPhT, [place] Oral Oncology Patient Advocate [phone] 01/09/2017 3:17 PM

## 2017-02-07 ENCOUNTER — Telehealth: Payer: Self-pay | Admitting: Oncology

## 2017-02-07 ENCOUNTER — Other Ambulatory Visit (HOSPITAL_BASED_OUTPATIENT_CLINIC_OR_DEPARTMENT_OTHER): Payer: Medicare Other

## 2017-02-07 ENCOUNTER — Ambulatory Visit (HOSPITAL_BASED_OUTPATIENT_CLINIC_OR_DEPARTMENT_OTHER): Payer: Medicare Other | Admitting: Oncology

## 2017-02-07 VITALS — BP 153/88 | HR 114 | Temp 98.1°F | Resp 18 | Ht 72.0 in | Wt 206.0 lb

## 2017-02-07 DIAGNOSIS — C61 Malignant neoplasm of prostate: Secondary | ICD-10-CM

## 2017-02-07 DIAGNOSIS — E291 Testicular hypofunction: Secondary | ICD-10-CM | POA: Diagnosis not present

## 2017-02-07 DIAGNOSIS — C7951 Secondary malignant neoplasm of bone: Secondary | ICD-10-CM

## 2017-02-07 LAB — COMPREHENSIVE METABOLIC PANEL
ALBUMIN: 3.7 g/dL (ref 3.5–5.0)
ALT: 9 U/L (ref 0–55)
ANION GAP: 11 meq/L (ref 3–11)
AST: 15 U/L (ref 5–34)
Alkaline Phosphatase: 72 U/L (ref 40–150)
BILIRUBIN TOTAL: 0.49 mg/dL (ref 0.20–1.20)
BUN: 11.9 mg/dL (ref 7.0–26.0)
CALCIUM: 10.1 mg/dL (ref 8.4–10.4)
CO2: 26 mEq/L (ref 22–29)
Chloride: 102 mEq/L (ref 98–109)
Creatinine: 1.2 mg/dL (ref 0.7–1.3)
EGFR: 63 mL/min/{1.73_m2} — AB (ref >90)
Glucose: 121 mg/dl (ref 70–140)
Potassium: 4 mEq/L (ref 3.5–5.1)
SODIUM: 139 meq/L (ref 136–145)
TOTAL PROTEIN: 7.4 g/dL (ref 6.4–8.3)

## 2017-02-07 LAB — CBC WITH DIFFERENTIAL/PLATELET
BASO%: 0.3 % (ref 0.0–2.0)
Basophils Absolute: 0 10*3/uL (ref 0.0–0.1)
EOS%: 3.1 % (ref 0.0–7.0)
Eosinophils Absolute: 0.2 10*3/uL (ref 0.0–0.5)
HCT: 37.8 % — ABNORMAL LOW (ref 38.4–49.9)
HGB: 12.5 g/dL — ABNORMAL LOW (ref 13.0–17.1)
LYMPH%: 36 % (ref 14.0–49.0)
MCH: 30 pg (ref 27.2–33.4)
MCHC: 33.1 g/dL (ref 32.0–36.0)
MCV: 90.6 fL (ref 79.3–98.0)
MONO#: 0.7 10*3/uL (ref 0.1–0.9)
MONO%: 10.9 % (ref 0.0–14.0)
NEUT%: 49.7 % (ref 39.0–75.0)
NEUTROS ABS: 3.3 10*3/uL (ref 1.5–6.5)
PLATELETS: 202 10*3/uL (ref 140–400)
RBC: 4.17 10*6/uL — AB (ref 4.20–5.82)
RDW: 13.9 % (ref 11.0–14.6)
WBC: 6.7 10*3/uL (ref 4.0–10.3)
lymph#: 2.4 10*3/uL (ref 0.9–3.3)

## 2017-02-07 NOTE — Telephone Encounter (Signed)
Gave patient avs report and appointments for September.  °

## 2017-02-07 NOTE — Progress Notes (Signed)
Hematology and Oncology Follow Up Visit  Gregory [name] [id number] 08-21-1928 83 y.o. 02/07/2017 2:52 PM [name], FNPHarris, [name], FNP   Principle Diagnosis: 83 year old gentleman with castration resistant prostate cancer with metastatic disease to the bone. He is initial diagnosis was in 2016 when he presented with a PSA of 18.7 and a Gleason score 4+5 = 9. This was in January 2017.   Prior Therapy: He is status post TURP procedure and orchiectomy in February 2017. He also received prophylactic radiation to the right femoral hip for a total of 30 gray in 10 fractions in the care of Dr. Tammi Klippel completed in April 2017.  His PSA started to rise again with the PSA of 2.94 in July 2017. He received Xofigo for a total of 6 treatments completed in March 2018. PSA was 5.15 in 10/26/2016 with castrate level testosterone. .   Current therapy: Xtandi 160 mg daily started in 01/2017.  Interim History: Gregory [name] presents today for a follow-up visit. Since the last visit, he started taking Xtandi without complications. He denied any nausea, vomiting, fatigue or hematuria. He did have urinary retention in June 2018 but has resolved. He denied any recent back pain, joint pain or pathological fractures. He continues to attend to her activities of daily living and ambulates without any difficulties. He was using public transportation to make it to his appointment. He denied any recent hospitalizations or illnesses.  He is not reporting any headaches, blurry vision, syncope or seizures. He does not report any fevers, chills, sweats or weight loss. He does not report any chest pain, palpitation, orthopnea or leg edema. He is not report any cough, wheezing or hemoptysis. He is not reporting nausea, vomiting or abdominal pain. He is not reporting frequency urgency or hesitancy. He does not report any hematuria or dysuria. He does not report any skeletal complaints. Remaining review of systems  unremarkable.    Medications: I have reviewed the patient's current medications.  Current Outpatient Prescriptions  Medication Sig Dispense Refill  . alendronate (FOSAMAX) 70 MG tablet Take 70 mg by mouth once a week. Take with a full glass of water on an empty stomach.    . enzalutamide (XTANDI) 40 MG capsule Take 4 capsules (160 mg total) by mouth daily. 120 capsule 0  . metoprolol succinate (TOPROL XL) 100 MG 24 hr tablet Take 1 tablet (100 mg total) by mouth daily. Take with or immediately following a meal. 90 tablet 1  . Multiple Vitamins-Minerals (MULTIVITAMIN WITH MINERALS) tablet Take 1 tablet by mouth every morning.     . polyethylene glycol (MIRALAX / GLYCOLAX) packet Take 17 g by mouth daily as needed for mild constipation. (Patient not taking: Reported on 12/23/2016) 14 each 0   No current facility-administered medications for this visit.      Allergies: No Known Allergies  Past Medical History, Surgical history, Social history, and Family History were reviewed and updated.  Physical Exam: Blood pressure (!) 153/88, pulse (!) 114, temperature 98.1 F (36.7 C), temperature source Oral, resp. rate 18, height 6' (1.829 m), weight 206 lb (93.4 kg), SpO2 100 %. ECOG: 1 General appearance: alert and cooperative appeared without distress. Head: Normocephalic, without obvious abnormality without oral thrush or ulcers. Neck: no adenopathy or masses. Lymph nodes: Cervical, supraclavicular, and axillary nodes normal. Heart:regular rate and rhythm, S1, S2 normal, no murmur, click, rub or gallop Lung:chest clear, no wheezing, rales, normal symmetric air entry.  Abdomin: soft, non-tender, without masses or organomegaly EXT:no  erythema, induration, or nodules   Lab Results: Lab Results  Component Value Date   WBC 6.7 02/07/2017   HGB 12.5 (L) 02/07/2017   HCT 37.8 (L) 02/07/2017   MCV 90.6 02/07/2017   PLT 202 02/07/2017     Chemistry      Component Value Date/Time   NA 142  12/30/2016 1313   K 3.8 12/30/2016 1313   CL 105 12/23/2016 1444   CO2 24 12/30/2016 1313   BUN 12.9 12/30/2016 1313   CREATININE 1.2 12/30/2016 1313      Component Value Date/Time   CALCIUM 10.2 12/30/2016 1313   ALKPHOS 67 12/30/2016 1313   AST 17 12/30/2016 1313   ALT 16 12/30/2016 1313   BILITOT 0.61 12/30/2016 1313       Impression and Plan:  83 year old gentleman with the following issues:  1. Castration resistant prostate cancer with metastatic disease to the bone. He is initial diagnosis was in 2017 when he presented with a PSA of 18.7 and a Gleason score 4+5 = 9. He developed advanced bony metastasis from presentation. He was treated with bilateral orchiectomy and TURP in January 2017.  He also received palliative radiation therapy to the right femur in April 2017 and completed 6 treatments of Xofigo in March 2018. PSA in April 2018 increased to 5.15 from 2.32 in May 2017.  He is currently on Xtandi 160 mg daily. He tolerated the first month without complications and the plan is to continue with the same dose and schedule. We'll continue to monitor his PSA periodically on this treatment.  2. Androgen depravation: He is status post bilateral orchiectomy.  3. Bone directed therapy: He would be a good candidate for Xgeva in the future. We will address that with him once he is taking Xtandi regularly without complications.  4. Prognosis: He continues to have excellent performance status and aggressive therapy is warranted.  5. Follow-up: In the next 6 weeks following his progress.    Zola Button, MD 7/31/20182:52 PM

## 2017-02-08 LAB — PSA: Prostate Specific Ag, Serum: 0.7 ng/mL (ref 0.0–4.0)

## 2017-02-17 ENCOUNTER — Other Ambulatory Visit: Payer: Self-pay | Admitting: Oncology

## 2017-02-17 DIAGNOSIS — C61 Malignant neoplasm of prostate: Secondary | ICD-10-CM

## 2017-02-20 ENCOUNTER — Other Ambulatory Visit: Payer: Self-pay | Admitting: Oncology

## 2017-02-20 DIAGNOSIS — C61 Malignant neoplasm of prostate: Secondary | ICD-10-CM

## 2017-02-28 ENCOUNTER — Other Ambulatory Visit: Payer: Self-pay | Admitting: *Deleted

## 2017-02-28 DIAGNOSIS — C61 Malignant neoplasm of prostate: Secondary | ICD-10-CM

## 2017-02-28 MED ORDER — ENZALUTAMIDE 40 MG PO CAPS: 160.0000 mg | ORAL_CAPSULE | Freq: Every day | ORAL | 0 refills | Status: DC

## 2017-02-28 NOTE — Telephone Encounter (Signed)
Called patient to inform him that I faxed a new prescription for [facility] to [place]. He verbalized understanding.

## 2017-03-27 ENCOUNTER — Encounter: Payer: Self-pay | Admitting: Family Medicine

## 2017-03-27 ENCOUNTER — Ambulatory Visit (INDEPENDENT_AMBULATORY_CARE_PROVIDER_SITE_OTHER): Payer: Medicare Other | Admitting: Family Medicine

## 2017-03-27 VITALS — BP 140/82 | HR 84 | Temp 98.7°F | Resp 14 | Ht 72.0 in | Wt 200.8 lb

## 2017-03-27 DIAGNOSIS — C61 Malignant neoplasm of prostate: Secondary | ICD-10-CM | POA: Diagnosis not present

## 2017-03-27 DIAGNOSIS — I10 Essential (primary) hypertension: Secondary | ICD-10-CM

## 2017-03-27 DIAGNOSIS — G4700 Insomnia, unspecified: Secondary | ICD-10-CM | POA: Diagnosis not present

## 2017-03-27 DIAGNOSIS — Z23 Encounter for immunization: Secondary | ICD-10-CM | POA: Diagnosis not present

## 2017-03-27 DIAGNOSIS — R530 Neoplastic (malignant) related fatigue: Secondary | ICD-10-CM

## 2017-03-27 LAB — POCT URINALYSIS DIP (DEVICE)
BILIRUBIN URINE: NEGATIVE
GLUCOSE, UA: NEGATIVE mg/dL
Ketones, ur: NEGATIVE mg/dL
NITRITE: POSITIVE — AB
Protein, ur: 100 mg/dL — AB
Specific Gravity, Urine: 1.02 (ref 1.005–1.030)
UROBILINOGEN UA: 0.2 mg/dL (ref 0.0–1.0)
pH: 5.5 (ref 5.0–8.0)

## 2017-03-27 MED ORDER — METOPROLOL SUCCINATE ER 100 MG PO TB24: 100.0000 mg | ORAL_TABLET | Freq: Every day | ORAL | 1 refills | Status: DC

## 2017-03-27 NOTE — Progress Notes (Signed)
Patient ID: Gregory [name], male    DOB: 05/30/1929, 83 y.o.   MRN: [id number]  PCP: [name], FNP  Chief Complaint  Patient presents with  . Follow-up    3 MONTH    Subjective:  HPI Gregory [name] is a 83 y.o. male with prostate and metastatic cancer presents today for 3 month hypertension follow-up. Gregory [name] reports that overall he has felt okay in spite of  experiencing persistent fatigue secondary to persistent chronic insomnia.At present he prescribed oral antineoplastic therapy, although has undergone radiation and completed earlier this year. He is followed by oncologist, Dr. Zola Button, MD. Gregory [name] reports no routine monitoring of blood pressure at home. He has a history of tachycardia which has been rate controlled with metoprolol. He's experienced no recent palpations, chest pain, shortness of breath, or chest pain. In spite of metastatic bone disease Gregory [name] reports that he is pain free today. Reports good appetite, urinary output , and regular daily bowel movements. Social History   Social History  . Marital status: Divorced    Spouse name: N/A  . Number of children: N/A  . Years of education: N/A   Occupational History  . Retired    Social History Main Topics  . Smoking status: Former Smoker    Packs/day: 0.25    Years: 30.00    Types: Cigarettes    Start date: 07/11/1992  . Smokeless tobacco: Never Used  . Alcohol use No  . Drug use: No  . Sexual activity: No   Other Topics Concern  . Not on file   Social History Narrative  . No narrative on file    Family History  Problem Relation Age of Onset  . Tuberculosis Mother 58  . Other Father 74   Review of Systems See HPI  Patient Active Problem List   Diagnosis Date Noted  . Difficult intravenous access   . Elevated troponin 09/01/2015  . Diastolic dysfunction with acute on chronic heart failure ([place]) 09/01/2015  . AKI (acute kidney injury) ([place]) 08/30/2015  . Hyperkalemia 08/30/2015  .  Prostate cancer ([place]) 08/29/2015  . Hematuria   . S/P TURP   . Urinary retention   . Metastatic adenocarcinoma to bone ([place]) 08/21/2015  . Essential hypertension 04/14/2015  . Malignant neoplasm prostate ([place]) 04/14/2015  . Hyperglycemia 04/14/2015  . Other fatigue 04/14/2015    No Known Allergies  Prior to Admission medications   Medication Sig Start Date End Date Taking? Authorizing Provider  alendronate (FOSAMAX) 70 MG tablet Take 70 mg by mouth once a week. Take with a full glass of water on an empty stomach.   Yes [provider]  enzalutamide Gillermina Phy) 40 MG capsule Take 4 capsules (160 mg total) by mouth daily. 02/28/17  Yes Wyatt Portela, MD  metoprolol succinate (TOPROL XL) 100 MG 24 hr tablet Take 1 tablet (100 mg total) by mouth daily. Take with or immediately following a meal. 12/23/16  Yes [name], FNP  Multiple Vitamins-Minerals (MULTIVITAMIN WITH MINERALS) tablet Take 1 tablet by mouth every morning.    Yes [provider]  polyethylene glycol (MIRALAX / GLYCOLAX) packet Take 17 g by mouth daily as needed for mild constipation. 09/05/15  Yes Eulogio Bear U, DO  tamsulosin (FLOMAX) 0.4 MG CAPS capsule Take 0.4 mg by mouth daily. 01/13/17  Yes [provider]    Past Medical, Surgical Family and Social History reviewed and updated.    Objective:   Today's Vitals  03/27/17 1314  BP: 140/82  Pulse: 84  Resp: 14  Temp: 98.7 F (37.1 C)  TempSrc: Oral  SpO2: 100%  Weight: 200 lb 12.8 oz (91.1 kg)  Height: 6' (1.829 m)    Wt Readings from Last 3 Encounters:  03/27/17 200 lb 12.8 oz (91.1 kg)  02/07/17 206 lb (93.4 kg)  01/05/17 207 lb (93.9 kg)    Physical Exam  Constitutional: He is oriented to person, place, and time. He appears well-developed and well-nourished.  HENT:  Head: Normocephalic and atraumatic.  Mouth/Throat: Oropharynx is clear and moist.  Eyes: Pupils are equal, round, and reactive to light. Conjunctivae  are normal.  Neck: Normal range of motion. Neck supple.  Cardiovascular: Normal rate, regular rhythm, normal heart sounds and intact distal pulses.   Pulmonary/Chest: Effort normal and breath sounds normal.  Musculoskeletal: Normal range of motion.  Neurological: He is alert and oriented to person, place, and time.  Skin: Skin is warm and dry.  Psychiatric: He has a normal mood and affect. His behavior is normal. Judgment and thought content normal.   Assessment & Plan:  1. Essential hypertension, controlled- continue current medication regimen 2. Prostate cancer (HCC)-managed by Dr. Zola Button, MD. 3. Insomnia, unspecified type, chronic , declined medication to facilitate sleep today. Encouraged good sleep hygiene. 4. Need for influenza vaccination - Flu Vaccine QUAD 36+ mos IM 5. Need for Tdap vaccination - Tdap vaccine greater than or equal to 7yo IM 6. Neoplastic malignant related fatigue, counseled on conserving energy and taking frequent breaks at the onset of fatigue.  RTC: 6 months for hypertension follow-up.   [name]. Kenton Kingfisher, MSN, FNP-C [facility]  [address] [place], [place] [address] [phone]  ]

## 2017-03-29 ENCOUNTER — Ambulatory Visit (HOSPITAL_BASED_OUTPATIENT_CLINIC_OR_DEPARTMENT_OTHER): Payer: Medicare Other | Admitting: Oncology

## 2017-03-29 ENCOUNTER — Other Ambulatory Visit: Payer: Self-pay | Admitting: *Deleted

## 2017-03-29 ENCOUNTER — Telehealth: Payer: Self-pay | Admitting: Oncology

## 2017-03-29 ENCOUNTER — Other Ambulatory Visit (HOSPITAL_BASED_OUTPATIENT_CLINIC_OR_DEPARTMENT_OTHER): Payer: Medicare Other

## 2017-03-29 VITALS — BP 158/86 | HR 106 | Temp 97.8°F | Resp 17 | Ht 72.0 in | Wt 203.6 lb

## 2017-03-29 DIAGNOSIS — C7951 Secondary malignant neoplasm of bone: Secondary | ICD-10-CM | POA: Diagnosis not present

## 2017-03-29 DIAGNOSIS — C61 Malignant neoplasm of prostate: Secondary | ICD-10-CM | POA: Diagnosis not present

## 2017-03-29 LAB — COMPREHENSIVE METABOLIC PANEL
ALT: 9 U/L (ref 0–55)
ANION GAP: 10 meq/L (ref 3–11)
AST: 15 U/L (ref 5–34)
Albumin: 3.6 g/dL (ref 3.5–5.0)
Alkaline Phosphatase: 73 U/L (ref 40–150)
BUN: 11.8 mg/dL (ref 7.0–26.0)
CHLORIDE: 105 meq/L (ref 98–109)
CO2: 26 meq/L (ref 22–29)
Calcium: 9.7 mg/dL (ref 8.4–10.4)
Creatinine: 1.3 mg/dL (ref 0.7–1.3)
EGFR: 58 mL/min/{1.73_m2} — AB (ref >90)
Glucose: 112 mg/dl (ref 70–140)
Potassium: 3.5 mEq/L (ref 3.5–5.1)
SODIUM: 141 meq/L (ref 136–145)
Total Bilirubin: 0.4 mg/dL (ref 0.20–1.20)
Total Protein: 7.6 g/dL (ref 6.4–8.3)

## 2017-03-29 LAB — CBC WITH DIFFERENTIAL/PLATELET
BASO%: 0.2 % (ref 0.0–2.0)
Basophils Absolute: 0 10*3/uL (ref 0.0–0.1)
EOS%: 3.6 % (ref 0.0–7.0)
Eosinophils Absolute: 0.3 10*3/uL (ref 0.0–0.5)
HCT: 39.2 % (ref 38.4–49.9)
HGB: 13 g/dL (ref 13.0–17.1)
LYMPH%: 43.3 % (ref 14.0–49.0)
MCH: 30.7 pg (ref 27.2–33.4)
MCHC: 33.2 g/dL (ref 32.0–36.0)
MCV: 92.5 fL (ref 79.3–98.0)
MONO#: 0.8 10*3/uL (ref 0.1–0.9)
MONO%: 9.5 % (ref 0.0–14.0)
NEUT#: 3.5 10*3/uL (ref 1.5–6.5)
NEUT%: 43.4 % (ref 39.0–75.0)
PLATELETS: 230 10*3/uL (ref 140–400)
RBC: 4.24 10*6/uL (ref 4.20–5.82)
RDW: 15.2 % — ABNORMAL HIGH (ref 11.0–14.6)
WBC: 8 10*3/uL (ref 4.0–10.3)
lymph#: 3.5 10*3/uL — ABNORMAL HIGH (ref 0.9–3.3)

## 2017-03-29 MED ORDER — ENZALUTAMIDE 40 MG PO CAPS: 160.0000 mg | ORAL_CAPSULE | Freq: Every day | ORAL | 0 refills | Status: DC

## 2017-03-29 NOTE — Progress Notes (Signed)
Hematology and Oncology Follow Up Visit  Gregory [name] [id number] Dec 24, 1928 83 y.o. 03/29/2017 1:41 PM [name], FNPHarris, [name], FNP   Principle Diagnosis: 83 year old gentleman with castration resistant prostate cancer with metastatic disease to the bone. He is initial diagnosis was in 2016 when he presented with a PSA of 18.7 and a Gleason score 4+5 = 9. This was in January 2017.   Prior Therapy: He is status post TURP procedure and orchiectomy in February 2017. He also received prophylactic radiation to the right femoral hip for a total of 30 gray in 10 fractions in the care of Dr. Tammi Klippel completed in April 2017.  His PSA started to rise again with the PSA of 2.94 in July 2017. He received Xofigo for a total of 6 treatments completed in March 2018. PSA was 5.15 in 10/26/2016 with castrate level testosterone. .   Current therapy: Xtandi 160 mg daily started in 01/2017.  Interim History: Mr. [name] presents today for a follow-up visit. Since the last visit, he reports no major changes in his health. He continues to take [facility] without complications. He denied any nausea, vomiting, fatigue or hematuria. He does report increased fatigue that remains manageable. He still able to ambulate without any difficulties inside his house. He denied any recent back pain, joint pain or pathological fractures. He continues to attend to her activities of daily living. He denied any recent hospitalizations or illnesses.  He is not reporting any headaches, blurry vision, syncope or seizures. He does not report any fevers, chills, sweats or weight loss. He does not report any chest pain, palpitation, orthopnea or leg edema. He is not report any cough, wheezing or hemoptysis. He is not reporting nausea, vomiting or abdominal pain. He is not reporting frequency urgency or hesitancy. He does not report any hematuria or dysuria. He does not report any skeletal complaints. Remaining review of  systems unremarkable.    Medications: I have reviewed the patient's current medications.  Current Outpatient Prescriptions  Medication Sig Dispense Refill  . alendronate (FOSAMAX) 70 MG tablet Take 70 mg by mouth once a week. Take with a full glass of water on an empty stomach.    . enzalutamide (XTANDI) 40 MG capsule Take 4 capsules (160 mg total) by mouth daily. 120 capsule 0  . metoprolol succinate (TOPROL XL) 100 MG 24 hr tablet Take 1 tablet (100 mg total) by mouth daily. Take with or immediately following a meal. 90 tablet 1  . Multiple Vitamins-Minerals (MULTIVITAMIN WITH MINERALS) tablet Take 1 tablet by mouth every morning.     . polyethylene glycol (MIRALAX / GLYCOLAX) packet Take 17 g by mouth daily as needed for mild constipation. 14 each 0  . tamsulosin (FLOMAX) 0.4 MG CAPS capsule Take 0.4 mg by mouth daily.  11   No current facility-administered medications for this visit.      Allergies: No Known Allergies  Past Medical History, Surgical history, Social history, and Family History were reviewed and updated.  Physical Exam: Blood pressure (!) 158/86, pulse (!) 106, temperature 97.8 F (36.6 C), temperature source Oral, resp. rate 17, height 6' (1.829 m), weight 203 lb 9.6 oz (92.4 kg), SpO2 100 %. ECOG: 1 General appearance: Well-appearing gentleman without distress. Head: Normocephalic, without obvious abnormality  No oral ulcers or lesions. Neck: no adenopathy or masses. Lymph nodes: Cervical, supraclavicular, and axillary nodes normal. Heart:regular rate and rhythm, S1, S2 normal, no murmur, click, rub or gallop Lung:chest clear, no wheezing, rales, normal  symmetric air entry.  Abdomin: soft, non-tender, without masses or organomegaly shifting dullness or ascites. EXT:no erythema, induration, or nodules   Lab Results: Lab Results  Component Value Date   WBC 8.0 03/29/2017   HGB 13.0 03/29/2017   HCT 39.2 03/29/2017   MCV 92.5 03/29/2017   PLT 230 03/29/2017      Chemistry      Component Value Date/Time   NA 139 02/07/2017 1428   K 4.0 02/07/2017 1428   CL 105 12/23/2016 1444   CO2 26 02/07/2017 1428   BUN 11.9 02/07/2017 1428   CREATININE 1.2 02/07/2017 1428      Component Value Date/Time   CALCIUM 10.1 02/07/2017 1428   ALKPHOS 72 02/07/2017 1428   AST 15 02/07/2017 1428   ALT 9 02/07/2017 1428   BILITOT 0.49 02/07/2017 1428     Results for Gregory [name] (MRN [id number]) as of 03/29/2017 13:07  Ref. Range 12/30/2016 13:13 02/07/2017 14:28  Prostate Specific Ag, Serum Latest Ref Range: 0.0 - 4.0 ng/mL 7.9 (H) 0.7    Impression and Plan:  83 year old gentleman with the following issues:  1. Castration resistant prostate cancer with metastatic disease to the bone. He is initial diagnosis was in 2017 when he presented with a PSA of 18.7 and a Gleason score 4+5 = 9. He developed advanced bony metastasis from presentation. He was treated with bilateral orchiectomy and TURP in January 2017.  He also received palliative radiation therapy to the right femur in April 2017 and completed 6 treatments of Xofigo in March 2018. PSA in April 2018 increased to 5.15 from 2.32 in May 2017.  He is currently on Xtandi 160 mg daily without any major complications. His PSA showed excellent response currently of 0.7. He is experiencing some mild fatigue and we can consider dose reductions in the future if he cannot tolerate this dose in the future.  2. Androgen depravation: He is status post bilateral orchiectomy.  3. Bone directed therapy: He would be a good candidate for Xgeva in the future. We will address that in the future once he has dental clearance.  4. Prognosis: He continues to have excellent performance status and aggressive therapy is warranted.  5. Follow-up: In the next 5 weeks following his progress.    Zola Button, MD 9/19/20181:41 PM

## 2017-03-29 NOTE — Telephone Encounter (Signed)
Gave avs and calendar for October  °

## 2017-03-31 LAB — PSA: PROSTATE SPECIFIC AG, SERUM: 1.3 ng/mL (ref 0.0–4.0)

## 2017-04-14 ENCOUNTER — Other Ambulatory Visit: Payer: Self-pay | Admitting: Urology

## 2017-04-14 DIAGNOSIS — C61 Malignant neoplasm of prostate: Secondary | ICD-10-CM

## 2017-04-21 ENCOUNTER — Encounter (HOSPITAL_COMMUNITY)
Admission: RE | Admit: 2017-04-21 | Discharge: 2017-04-21 | Disposition: A | Discharge status: Home / Self Care | Payer: Medicare Other | Source: Ambulatory Visit | Attending: Urology | Admitting: Urology

## 2017-04-21 DIAGNOSIS — C61 Malignant neoplasm of prostate: Secondary | ICD-10-CM | POA: Diagnosis present

## 2017-04-21 MED ORDER — TECHNETIUM TC 99M MEDRONATE IV KIT
25.0000 | PACK | Freq: Once | INTRAVENOUS | Status: AC | PRN
  Administered 2017-04-21: 25 via INTRAVENOUS

## 2017-04-23 IMAGING — DX DG CHEST 1V PORT
2 series · 2 of 2 positions shown · non-contrast
Comparison: Chest radiographs 04/01/2015. CT Abdomen and Pelvis
08/12/2015.

CLINICAL DATA: 86-year-old male with shortness of Breath. Initial
encounter. Prostate cancer with metastatic disease to bone.

EXAM:
PORTABLE CHEST 1 VIEW

[chest ap (1 of 2)]
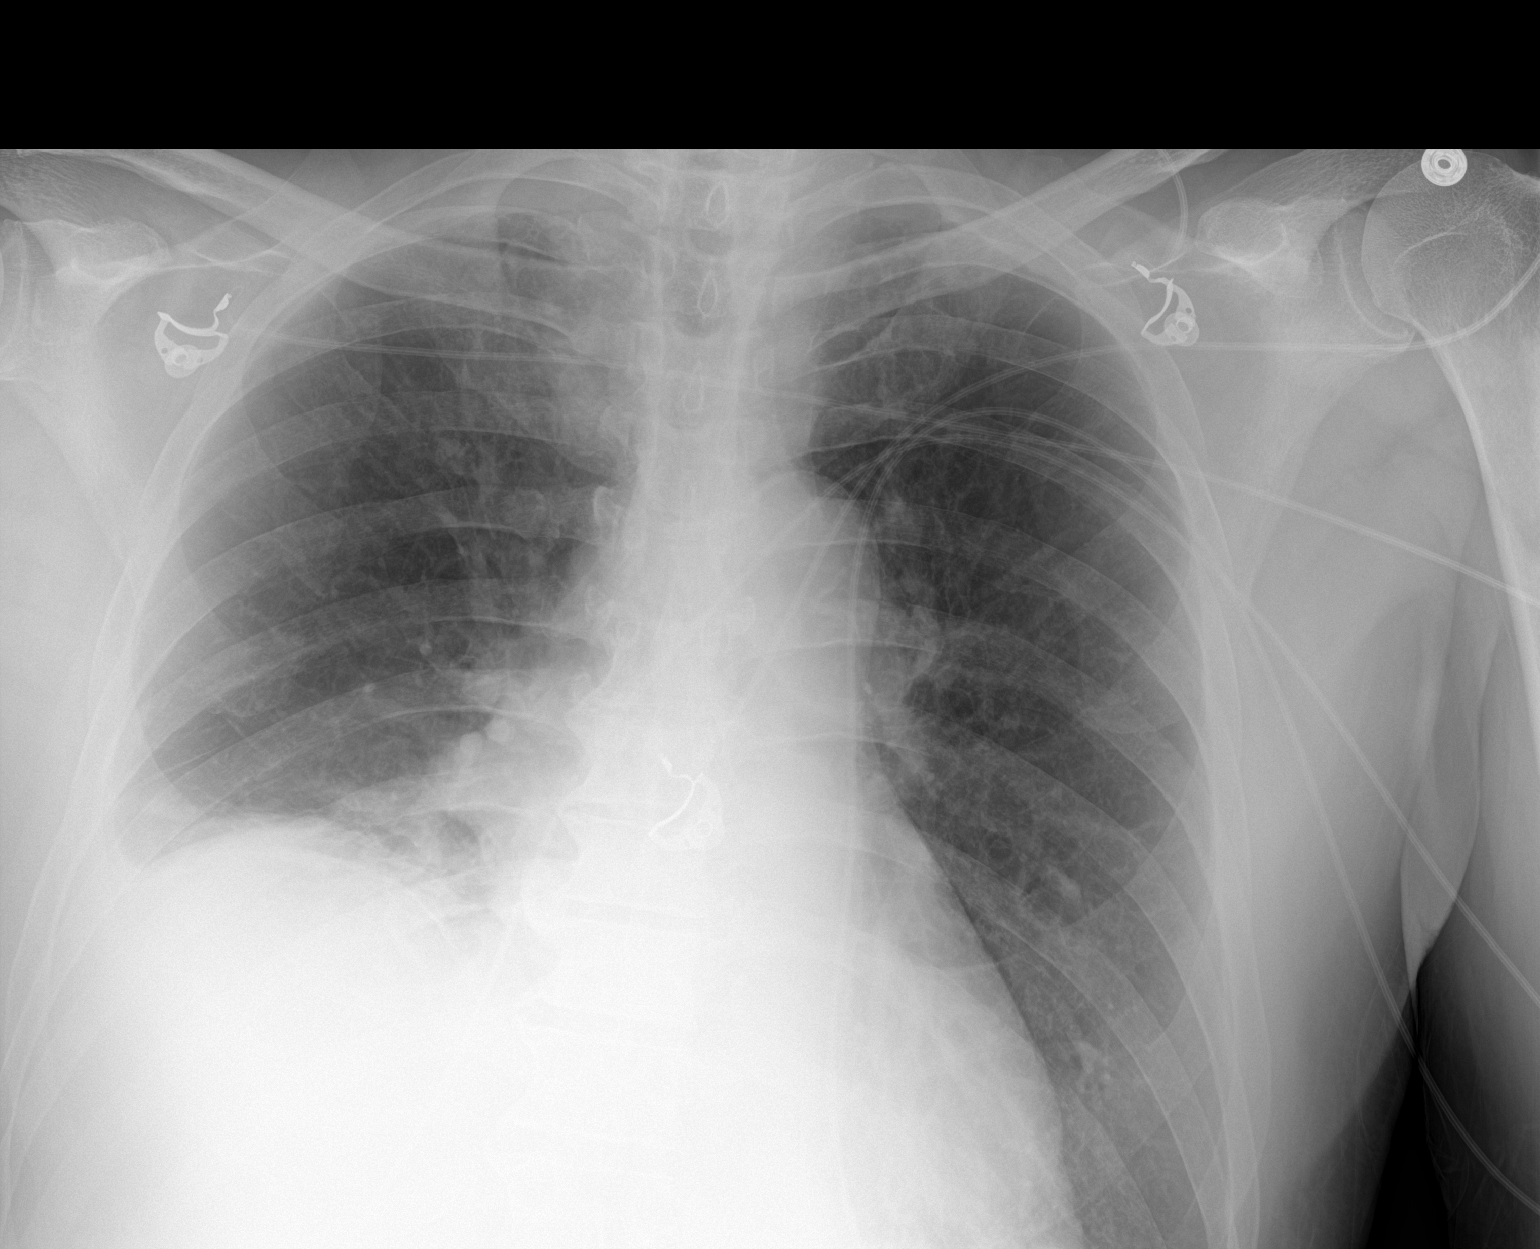

[chest ap (2 of 2)]
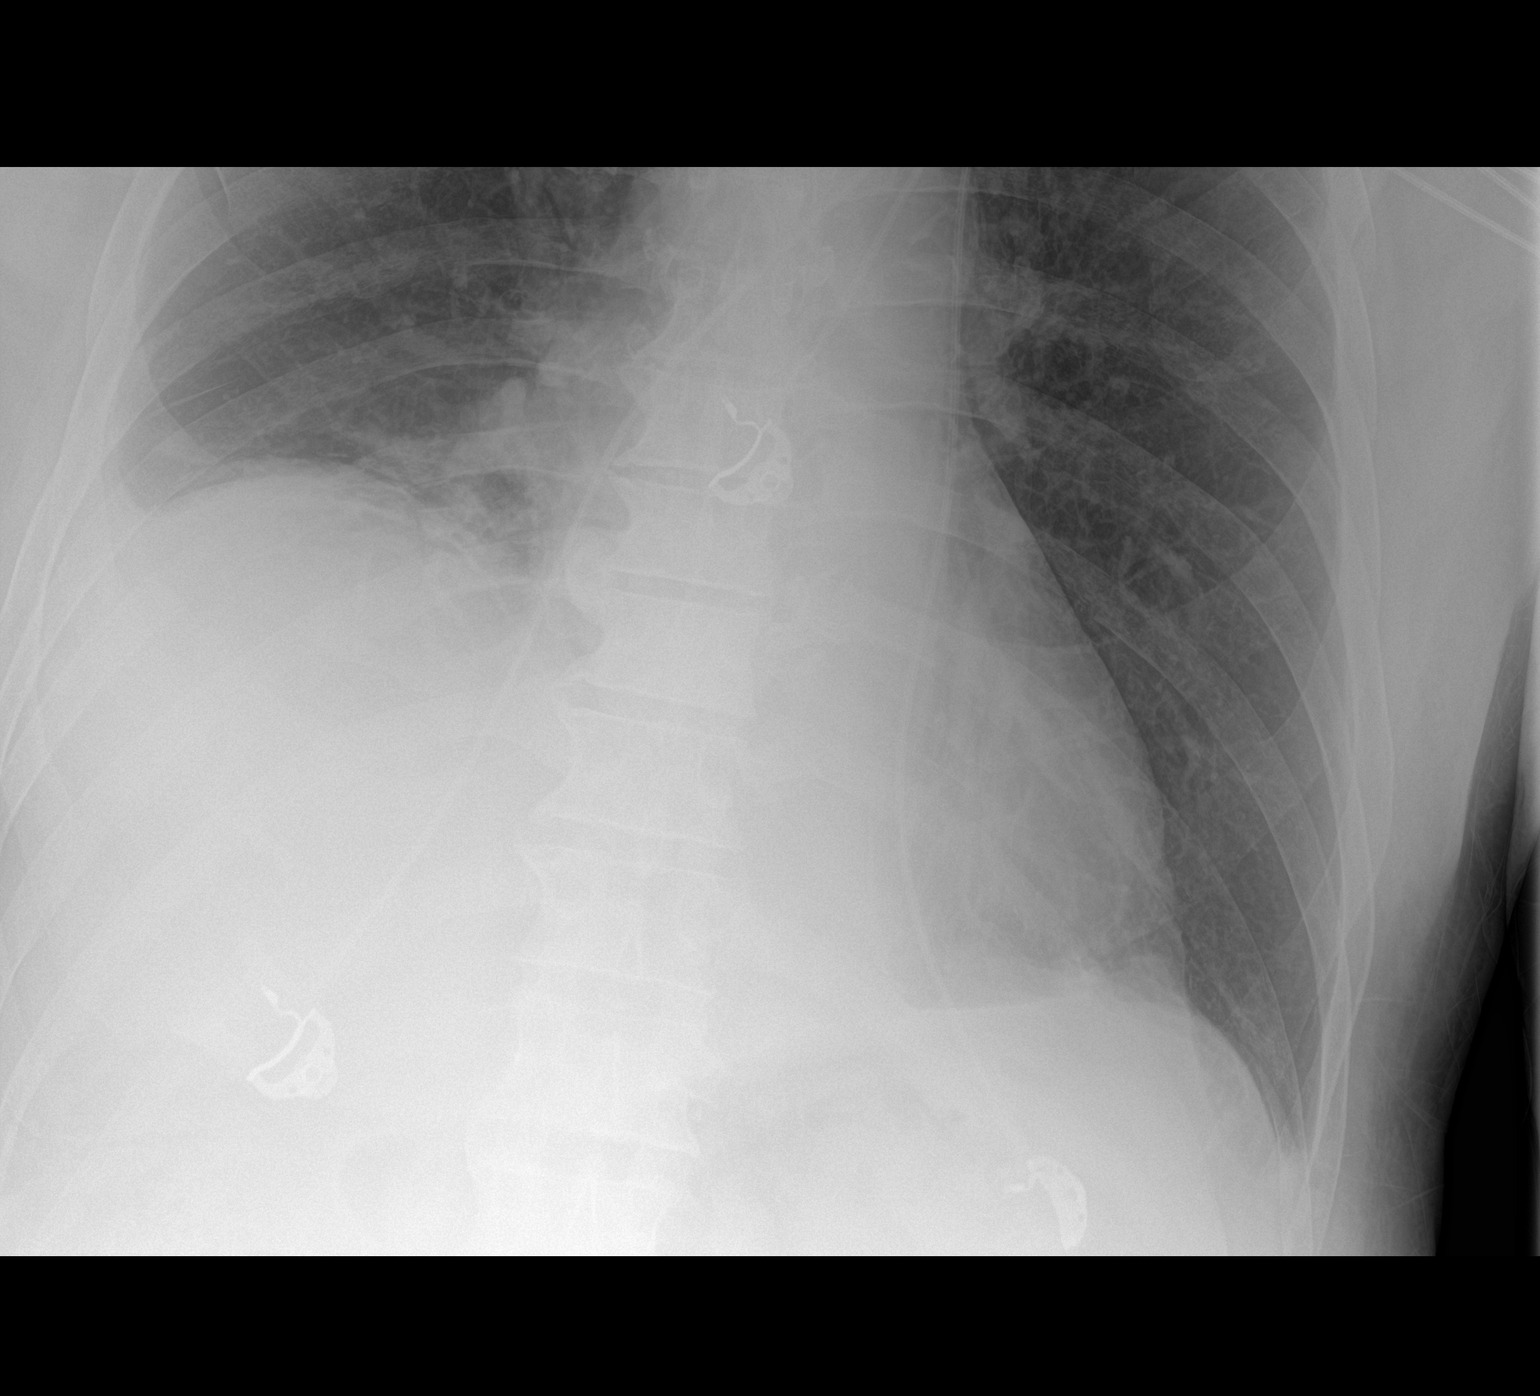

[2 of 2 positions shown; findings below may reference images not displayed]

FINDINGS: Portable AP semi upright view at 8445 hours. Stable elevation of the
right hemidiaphragm with associated right lung base atelectasis.
Mediastinal contours are stable and within normal limits. Visualized
tracheal air column is within normal limits. Scattered ribs
sclerosis, better demonstrated by CT. Sclerotic vertebral body
metastasis are poorly visible radiographically. No pneumothorax,
pulmonary edema, pleural effusion or acute pulmonary opacity.
IMPRESSION: 1. No acute cardiopulmonary abnormality. Chronic right phrenic nerve
palsy suspected.
2. Widespread osseous metastatic disease, better demonstrated on
recent CT Abdomen and Pelvis.

## 2017-05-05 ENCOUNTER — Telehealth: Payer: Self-pay | Admitting: Oncology

## 2017-05-05 ENCOUNTER — Other Ambulatory Visit (HOSPITAL_BASED_OUTPATIENT_CLINIC_OR_DEPARTMENT_OTHER): Payer: Medicare Other

## 2017-05-05 ENCOUNTER — Ambulatory Visit (HOSPITAL_BASED_OUTPATIENT_CLINIC_OR_DEPARTMENT_OTHER): Payer: Medicare Other | Admitting: Oncology

## 2017-05-05 ENCOUNTER — Other Ambulatory Visit: Payer: Self-pay | Admitting: *Deleted

## 2017-05-05 VITALS — BP 149/89 | HR 110 | Temp 97.9°F | Resp 17 | Ht 72.0 in | Wt 206.6 lb

## 2017-05-05 DIAGNOSIS — C61 Malignant neoplasm of prostate: Secondary | ICD-10-CM

## 2017-05-05 DIAGNOSIS — C7951 Secondary malignant neoplasm of bone: Secondary | ICD-10-CM | POA: Diagnosis not present

## 2017-05-05 LAB — CBC WITH DIFFERENTIAL/PLATELET
BASO%: 0.3 % (ref 0.0–2.0)
BASOS ABS: 0 10*3/uL (ref 0.0–0.1)
EOS%: 2.3 % (ref 0.0–7.0)
Eosinophils Absolute: 0.2 10*3/uL (ref 0.0–0.5)
HEMATOCRIT: 40.3 % (ref 38.4–49.9)
HGB: 13.3 g/dL (ref 13.0–17.1)
LYMPH%: 34.1 % (ref 14.0–49.0)
MCH: 30.8 pg (ref 27.2–33.4)
MCHC: 33 g/dL (ref 32.0–36.0)
MCV: 93.2 fL (ref 79.3–98.0)
MONO#: 0.6 10*3/uL (ref 0.1–0.9)
MONO%: 7.7 % (ref 0.0–14.0)
NEUT#: 4 10*3/uL (ref 1.5–6.5)
NEUT%: 55.6 % (ref 39.0–75.0)
Platelets: 202 10*3/uL (ref 140–400)
RBC: 4.32 10*6/uL (ref 4.20–5.82)
RDW: 15.2 % — ABNORMAL HIGH (ref 11.0–14.6)
WBC: 7.2 10*3/uL (ref 4.0–10.3)
lymph#: 2.5 10*3/uL (ref 0.9–3.3)

## 2017-05-05 LAB — COMPREHENSIVE METABOLIC PANEL
ALK PHOS: 56 U/L (ref 40–150)
ALT: 9 U/L (ref 0–55)
AST: 14 U/L (ref 5–34)
Albumin: 4 g/dL (ref 3.5–5.0)
Anion Gap: 11 mEq/L (ref 3–11)
BILIRUBIN TOTAL: 0.4 mg/dL (ref 0.20–1.20)
BUN: 14.1 mg/dL (ref 7.0–26.0)
CALCIUM: 9.9 mg/dL (ref 8.4–10.4)
CO2: 25 meq/L (ref 22–29)
CREATININE: 1.2 mg/dL (ref 0.7–1.3)
Chloride: 105 mEq/L (ref 98–109)
EGFR: >60 mL/min/{1.73_m2} (ref >60)
GLUCOSE: 109 mg/dL (ref 70–140)
Potassium: 3.7 mEq/L (ref 3.5–5.1)
SODIUM: 141 meq/L (ref 136–145)
Total Protein: 7.7 g/dL (ref 6.4–8.3)

## 2017-05-05 MED ORDER — ENZALUTAMIDE 40 MG PO CAPS: 160.0000 mg | ORAL_CAPSULE | Freq: Every day | ORAL | 0 refills | Status: DC

## 2017-05-05 NOTE — Progress Notes (Signed)
Hematology and Oncology Follow Up Visit  Gregory [name] [id number] July 06, 1929 83 y.o. 05/05/2017 2:55 PM Gregory [name], FNPHarris, Gregory Sage, Gregory [name]   Principle Diagnosis: 83 year old gentleman with castration resistant prostate cancer with metastatic disease to the bone. He is initial diagnosis was in 2016 when he presented with a PSA of 18.7 and a Gleason score 4+5 = 9. This was in January 2017.   Prior Therapy: He is status post TURP procedure and orchiectomy in February 2017. He also received prophylactic radiation to the right femoral hip for a total of 30 gray in 10 fractions in the care of Dr. Tammi Klippel completed in April 2017.  His PSA started to rise again with the PSA of 2.94 in July 2017. He received Xofigo for a total of 6 treatments completed in March 2018. PSA was 5.15 in 10/26/2016 with castrate level testosterone. .   Current therapy: Xtandi 160 mg daily started in 01/2017.  Interim History: Gregory [name] presents today for a follow-up visit. Since the last visit, he continues to do well.  He remains on Xtandi without complications. He denied any fatigue or hematuria. He still able to ambulate without any difficulties inside his house. He denied any recent back pain, joint pain or pathological fractures. He continues to attend to her activities of daily living. He denied any recent hospitalizations or illnesses.  His appetite remain excellent and gained weight.  He continues to use public transportation to make it to his appointments.  He is not reporting any headaches, blurry vision, syncope or seizures. He does not report any fevers, chills, sweats or weight loss. He does not report any chest pain, palpitation, orthopnea or leg edema. He is not report any cough, wheezing or hemoptysis. He is not reporting nausea, vomiting or abdominal pain. He is not reporting frequency urgency or hesitancy. He does not report any hematuria or dysuria. He does not report any skeletal complaints.  Remaining review of systems unremarkable.    Medications: I have reviewed the patient's current medications.  Current Outpatient Prescriptions  Medication Sig Dispense Refill  . alendronate (FOSAMAX) 70 MG tablet Take 70 mg by mouth once a week. Take with a full glass of water on an empty stomach.    . enzalutamide (XTANDI) 40 MG capsule Take 4 capsules (160 mg total) by mouth daily. 120 capsule 0  . metoprolol succinate (TOPROL XL) 100 MG 24 hr tablet Take 1 tablet (100 mg total) by mouth daily. Take with or immediately following a meal. 90 tablet 1  . Multiple Vitamins-Minerals (MULTIVITAMIN WITH MINERALS) tablet Take 1 tablet by mouth every morning.     . polyethylene glycol (MIRALAX / GLYCOLAX) packet Take 17 g by mouth daily as needed for mild constipation. 14 each 0  . tamsulosin (FLOMAX) 0.4 MG CAPS capsule Take 0.4 mg by mouth daily.  11   No current facility-administered medications for this visit.      Allergies: No Known Allergies  Past Medical History, Surgical history, Social history, and Family History were reviewed and updated.  Physical Exam: Blood pressure (!) 149/89, pulse (!) 110, temperature 97.9 F (36.6 C), temperature source Oral, resp. rate 17, height 6' (1.829 m), weight 206 lb 9.6 oz (93.7 kg), SpO2 100 %. ECOG: 1 General appearance: Alert, awake gentleman without distress. Head: Normocephalic, without obvious abnormality  No oral ulcers or lesions. Neck: no adenopathy or masses. Lymph nodes: Cervical, supraclavicular, and axillary nodes normal. Heart:regular rate and rhythm, S1, S2 normal, no murmur, click,  rub or gallop Lung:chest clear, no wheezing, rales, normal symmetric air entry.  Abdomin: soft, non-tender, without masses or organomegaly no rebound or guarding. EXT:no erythema, induration, or nodules   Lab Results: Lab Results  Component Value Date   WBC 7.2 05/05/2017   HGB 13.3 05/05/2017   HCT 40.3 05/05/2017   MCV 93.2 05/05/2017   PLT  202 05/05/2017     Chemistry      Component Value Date/Time   NA 141 03/29/2017 1311   K 3.5 03/29/2017 1311   CL 105 12/23/2016 1444   CO2 26 03/29/2017 1311   BUN 11.8 03/29/2017 1311   CREATININE 1.3 03/29/2017 1311      Component Value Date/Time   CALCIUM 9.7 03/29/2017 1311   ALKPHOS 73 03/29/2017 1311   AST 15 03/29/2017 1311   ALT 9 03/29/2017 1311   BILITOT 0.40 03/29/2017 1311     Results for Gregory [name], Gregory [name] (MRN [id number]) as of 05/05/2017 14:44  Ref. Range 12/30/2016 13:13 02/07/2017 14:28 03/29/2017 13:11  Prostate Specific Ag, Serum Latest Ref Range: 0.0 - 4.0 ng/mL 7.9 (H) 0.7 1.3  EXAM: NUCLEAR MEDICINE WHOLE BODY BONE SCAN  TECHNIQUE: Whole body anterior and posterior images were obtained approximately 3 hours after intravenous injection of radiopharmaceutical.  RADIOPHARMACEUTICALS:  21.2 mCi Technetium-73m MDP IV  COMPARISON:  02/01/2016  FINDINGS: The majority of the numerous foci of abnormal increased osseous tracer accumulation seen on the previous exam are no longer identified.  Minimal abnormal residual tracer localization at the proximal femora and proximal LEFT humerus.  No definite new sites of abnormal osseous tracer accumulation identified to suggest new osseous metastases.  Overall decreased osseous tracer accumulation since previous exam.  Expected urinary tract and soft tissue distribution of tracer.  IMPRESSION: Marked interval decrease in number of sites of abnormal osseous tracer accumulation at previously identified axial and appendicular osseous metastases, with subtle residual areas of increased tracer accumulation at the proximal femora and proximal LEFT humerus noted.  No new abnormalities.    Impression and Plan:  82 year old gentleman with the following issues:  1. Castration resistant prostate cancer with metastatic disease to the bone. He is initial diagnosis was in 2017 when he presented with a PSA  of 18.7 and a Gleason score 4+5 = 9. He developed advanced bony metastasis from presentation. He was treated with bilateral orchiectomy and TURP in January 2017.  He also received palliative radiation therapy to the right femur in April 2017 and completed 6 treatments of Xofigo in March 2018. PSA in April 2018 increased to 5.15 from 2.32 in May 2017.  He is currently on Xtandi 160 mg daily and continues to tolerate it well.    His PSA continues to be reasonably controlled without any major changes in his bone scan.  His bone scan April 21, 2017 showed for the most part positive response to therapy.  Risks and benefits of this medication was reviewed today and is agreeable to continue.  2. Androgen depravation: He is status post bilateral orchiectomy.  3. Bone directed therapy: He would be a good candidate for Xgeva in the future.  His bone disease remains under control will consider this option after obtaining dental clearance.  4. Prognosis: He continues to have excellent performance status and aggressive therapy is warranted.  5. Follow-up: In the next 8 weeks following his progress.    Zola Button, MD 10/26/20182:55 PM

## 2017-05-05 NOTE — Telephone Encounter (Signed)
Gave patient avs report and appointments for December  °

## 2017-05-06 LAB — PSA: Prostate Specific Ag, Serum: 1.6 ng/mL (ref 0.0–4.0)

## 2017-06-14 ENCOUNTER — Telehealth: Payer: Self-pay | Admitting: Pharmacy Technician

## 2017-06-14 NOTE — Telephone Encounter (Signed)
Oral Oncology Patient Advocate Encounter  Received communication from [employer] that the patient's eligibility in the patient assistance program was due for re-enrollment.  The renewal application has been completed and faxed in an effort to keep the patient's out of pocket expense for Xtandi at $0.    Application completed and faxed to [phone].   [place] phone number for follow up is [phone].   This encounter will be updated until final determination.  Fabio Asa. Melynda Keller, [facility] Patient [place] [phone] 06/14/2017 12:46 PM

## 2017-06-29 ENCOUNTER — Other Ambulatory Visit: Payer: Self-pay | Admitting: *Deleted

## 2017-06-29 ENCOUNTER — Telehealth: Payer: Self-pay | Admitting: Oncology

## 2017-06-29 ENCOUNTER — Other Ambulatory Visit (HOSPITAL_BASED_OUTPATIENT_CLINIC_OR_DEPARTMENT_OTHER): Payer: Medicare Other

## 2017-06-29 ENCOUNTER — Ambulatory Visit (HOSPITAL_BASED_OUTPATIENT_CLINIC_OR_DEPARTMENT_OTHER): Payer: Medicare Other | Admitting: Oncology

## 2017-06-29 VITALS — BP 148/92 | HR 98 | Temp 97.6°F | Resp 18 | Ht 72.0 in | Wt 204.2 lb

## 2017-06-29 DIAGNOSIS — C7951 Secondary malignant neoplasm of bone: Secondary | ICD-10-CM | POA: Diagnosis not present

## 2017-06-29 DIAGNOSIS — C61 Malignant neoplasm of prostate: Secondary | ICD-10-CM

## 2017-06-29 LAB — COMPREHENSIVE METABOLIC PANEL
ALT: 9 U/L (ref 0–55)
ANION GAP: 10 meq/L (ref 3–11)
AST: 15 U/L (ref 5–34)
Albumin: 4 g/dL (ref 3.5–5.0)
Alkaline Phosphatase: 57 U/L (ref 40–150)
BUN: 10.2 mg/dL (ref 7.0–26.0)
CALCIUM: 10.1 mg/dL (ref 8.4–10.4)
CHLORIDE: 105 meq/L (ref 98–109)
CO2: 25 mEq/L (ref 22–29)
Creatinine: 1.1 mg/dL (ref 0.7–1.3)
Glucose: 129 mg/dl (ref 70–140)
POTASSIUM: 4.8 meq/L (ref 3.5–5.1)
Sodium: 140 mEq/L (ref 136–145)
Total Bilirubin: 0.64 mg/dL (ref 0.20–1.20)
Total Protein: 7.5 g/dL (ref 6.4–8.3)

## 2017-06-29 LAB — CBC WITH DIFFERENTIAL/PLATELET
BASO%: 0.3 % (ref 0.0–2.0)
Basophils Absolute: 0 10*3/uL (ref 0.0–0.1)
EOS ABS: 0.2 10*3/uL (ref 0.0–0.5)
EOS%: 2.2 % (ref 0.0–7.0)
HEMATOCRIT: 41.2 % (ref 38.4–49.9)
HEMOGLOBIN: 13.6 g/dL (ref 13.0–17.1)
LYMPH#: 2.3 10*3/uL (ref 0.9–3.3)
LYMPH%: 30.8 % (ref 14.0–49.0)
MCH: 30.6 pg (ref 27.2–33.4)
MCHC: 33 g/dL (ref 32.0–36.0)
MCV: 92.8 fL (ref 79.3–98.0)
MONO#: 0.6 10*3/uL (ref 0.1–0.9)
MONO%: 7.5 % (ref 0.0–14.0)
NEUT%: 59.2 % (ref 39.0–75.0)
NEUTROS ABS: 4.4 10*3/uL (ref 1.5–6.5)
Platelets: 182 10*3/uL (ref 140–400)
RBC: 4.44 10*6/uL (ref 4.20–5.82)
RDW: 13.6 % (ref 11.0–14.6)
WBC: 7.4 10*3/uL (ref 4.0–10.3)

## 2017-06-29 MED ORDER — ENZALUTAMIDE 40 MG PO CAPS: 160.0000 mg | ORAL_CAPSULE | Freq: Every day | ORAL | 0 refills | Status: DC

## 2017-06-29 NOTE — Progress Notes (Signed)
Hematology and Oncology Follow Up Visit  CORI HENNINGSEN [id number] 05-Nov-1928 83 y.o. 06/29/2017 2:10 PM [name], FNPHarris, [name], FNP   Principle Diagnosis: 83 year old gentleman with castration resistant prostate cancer with metastatic disease to the bone. He is initial diagnosis was in 2016 when he presented with a PSA of 18.7 and a Gleason score 4+5 = 9. This was in January 2017.   Prior Therapy: He is status post TURP procedure and orchiectomy in February 2017. He also received prophylactic radiation to the right femoral hip for a total of 30 gray in 10 fractions in the care of Dr. Tammi Klippel completed in April 2017.  His PSA started to rise again with the PSA of 2.94 in July 2017. He received Xofigo for a total of 6 treatments completed in March 2018. PSA was 5.15 in 10/26/2016 with castrate level testosterone. .   Current therapy: Xtandi 160 mg daily started in 01/2017.  Interim History: Mr. Fadeley presents today for a follow-up visit. Since the last visit, he is no recent changes in his health.  He remains on Xtandi and continues to tolerate it without any side effects. He denied any fatigue or hematuria.  He denies any nausea or GI complaints.  He still able to ambulate without any difficulties and uses public transportation to make his appointments. He denied any back pain, joint pain or pathological fractures. He continues to attend to her activities of daily living. He denied any recent hospitalizations or illnesses.  His appetite is unchanged.  He is not reporting any headaches, blurry vision, syncope or seizures. He does not report any fevers, chills, sweats or weight loss. He does not report any chest pain, palpitation, orthopnea or leg edema. He is not report any cough, wheezing or hemoptysis. He is not reporting nausea, vomiting or abdominal pain. He is not reporting frequency urgency or hesitancy. He does not report any hematuria or dysuria. He does not report any  skeletal complaints. Remaining review of systems unremarkable.    Medications: I have reviewed the patient's current medications.  Current Outpatient Medications  Medication Sig Dispense Refill  . alendronate (FOSAMAX) 70 MG tablet Take 70 mg by mouth once a week. Take with a full glass of water on an empty stomach.    . metoprolol succinate (TOPROL XL) 100 MG 24 hr tablet Take 1 tablet (100 mg total) by mouth daily. Take with or immediately following a meal. 90 tablet 1  . Multiple Vitamins-Minerals (MULTIVITAMIN WITH MINERALS) tablet Take 1 tablet by mouth every morning.     . polyethylene glycol (MIRALAX / GLYCOLAX) packet Take 17 g by mouth daily as needed for mild constipation. 14 each 0  . tamsulosin (FLOMAX) 0.4 MG CAPS capsule Take 0.4 mg by mouth daily.  11  . enzalutamide (XTANDI) 40 MG capsule Take 4 capsules (160 mg total) by mouth daily. 240 capsule 0   No current facility-administered medications for this visit.      Allergies: No Known Allergies  Past Medical History, Surgical history, Social history, and Family History were reviewed and updated.  Physical Exam: Blood pressure (!) 148/92, pulse 98, temperature 97.6 F (36.4 C), temperature source Oral, resp. rate 18, height 6' (1.829 m), weight 204 lb 3.2 oz (92.6 kg), SpO2 100 %. ECOG: 1 General appearance: Well-appearing gentleman without distress.. Head: Normocephalic, without obvious abnormality  No oral ulcers.  Neck: no adenopathy or neck masses. Lymph nodes: Cervical, supraclavicular, and axillary nodes normal. Heart:regular rate and rhythm, S1,  S2 normal, no murmur, click, rub or gallop Lung:chest clear, no wheezing, rales, normal symmetric air entry.  Abdomin: soft, non-tender, without masses or organomegaly no shifting dullness or ascites. EXT:no erythema, induration, or nodules   Lab Results: Lab Results  Component Value Date   WBC 7.4 06/29/2017   HGB 13.6 06/29/2017   HCT 41.2 06/29/2017   MCV 92.8  06/29/2017   PLT 182 06/29/2017     Chemistry      Component Value Date/Time   NA 141 05/05/2017 1422   K 3.7 05/05/2017 1422   CL 105 12/23/2016 1444   CO2 25 05/05/2017 1422   BUN 14.1 05/05/2017 1422   CREATININE 1.2 05/05/2017 1422      Component Value Date/Time   CALCIUM 9.9 05/05/2017 1422   ALKPHOS 56 05/05/2017 1422   AST 14 05/05/2017 1422   ALT 9 05/05/2017 1422   BILITOT 0.40 05/05/2017 1422     Results for HOLDYN, POYSER (MRN [id number]) as of 06/29/2017 13:49  Ref. Range 03/29/2017 13:11 05/05/2017 14:22  Prostate Specific Ag, Serum Latest Ref Range: 0.0 - 4.0 ng/mL 1.3 1.6      Impression and Plan:  83 year old gentleman with the following issues:  1. Castration resistant prostate cancer with metastatic disease to the bone. He is initial diagnosis was in 2017 when he presented with a PSA of 18.7 and a Gleason score 4+5 = 9. He developed advanced bony metastasis from presentation. He was treated with bilateral orchiectomy and TURP in January 2017.  He also received palliative radiation therapy to the right femur in April 2017 and completed 6 treatments of Xofigo in March 2018. PSA in April 2018 increased to 5.15 from 2.32 in May 2017.  He is currently on Xtandi 160 mg daily and continues to tolerate it without complications.  His PSA showed slight increase but remains reasonably controlled for the time being.  Risks and benefits of continuing this medication was discussed today and is agreeable to continue.  2. Androgen depravation: He is status post bilateral orchiectomy.  3. Bone directed therapy: He would be a good candidate for Xgeva in the future.  This will be used in the future if he has progression of disease in his bone.  4. Prognosis: The goal of the treatment is palliation although he continues to have excellent performance status at this time.  5. Follow-up: In March 2019.    Zola Button, MD 12/20/20182:10 PM

## 2017-06-29 NOTE — Telephone Encounter (Signed)
Gave avs and calendar for march 2019 

## 2017-06-30 LAB — PSA: Prostate Specific Ag, Serum: 2.1 ng/mL (ref 0.0–4.0)

## 2017-06-30 NOTE — Telephone Encounter (Signed)
Oral Oncology Patient Advocate Encounter  Received notification from [employer] that patient has been successfully re-enrolled into their program to continue to receive Xtandi from the manufacturer at $0 out of pocket until 07/10/2018.   I called and left a message for the patient.   Oral Oncology Clinic will continue to follow.  Gilmore Laroche, CPhT, [place] Oral Oncology Patient Advocate [phone] 06/30/2017 12:27 PM

## 2017-09-19 ENCOUNTER — Other Ambulatory Visit: Payer: Self-pay

## 2017-09-19 ENCOUNTER — Ambulatory Visit: Payer: Self-pay | Admitting: Oncology

## 2017-09-25 ENCOUNTER — Telehealth: Payer: Self-pay

## 2017-09-25 ENCOUNTER — Other Ambulatory Visit: Payer: Self-pay

## 2017-09-25 ENCOUNTER — Encounter: Payer: Self-pay | Admitting: Family Medicine

## 2017-09-25 ENCOUNTER — Ambulatory Visit (INDEPENDENT_AMBULATORY_CARE_PROVIDER_SITE_OTHER): Payer: Medicare Other | Admitting: Family Medicine

## 2017-09-25 VITALS — BP 135/74 | HR 80 | Temp 98.2°F | Ht 72.0 in | Wt 209.0 lb

## 2017-09-25 DIAGNOSIS — I10 Essential (primary) hypertension: Secondary | ICD-10-CM

## 2017-09-25 DIAGNOSIS — R530 Neoplastic (malignant) related fatigue: Secondary | ICD-10-CM

## 2017-09-25 DIAGNOSIS — C61 Malignant neoplasm of prostate: Secondary | ICD-10-CM

## 2017-09-25 MED ORDER — METOPROLOL SUCCINATE ER 100 MG PO TB24: 100.0000 mg | ORAL_TABLET | Freq: Every day | ORAL | 3 refills | Status: DC

## 2017-09-25 NOTE — Patient Instructions (Signed)
  For insomnia, try Benadryl 25 mg at bedtime as needed for sleep.    Fatigue Fatigue is feeling tired all of the time, a lack of energy, or a lack of motivation. Occasional or mild fatigue is often a normal response to activity or life in general. However, long-lasting (chronic) or extreme fatigue may indicate an underlying medical condition. Follow these instructions at home: Watch your fatigue for any changes. The following actions may help to lessen any discomfort you are feeling:  Talk to your health care provider about how much sleep you need each night. Try to get the required amount every night.  Take medicines only as directed by your health care provider.  Eat a healthy and nutritious diet. Ask your health care provider if you need help changing your diet.  Drink enough fluid to keep your urine clear or pale yellow.  Practice ways of relaxing, such as yoga, meditation, massage therapy, or acupuncture.  Exercise regularly.  Change situations that cause you stress. Try to keep your work and personal routine reasonable.  Do not abuse illegal drugs.  Limit alcohol intake to no more than 1 drink per day for nonpregnant women and 2 drinks per day for men. One drink equals 12 ounces of beer, 5 ounces of wine, or 1 ounces of hard liquor.  Take a multivitamin, if directed by your health care provider.  Contact a health care provider if:  Your fatigue does not get better.  You have a fever.  You have unintentional weight loss or gain.  You have headaches.  You have difficulty: ? Falling asleep. ? Sleeping throughout the night.  You feel angry, guilty, anxious, or sad.  You are unable to have a bowel movement (constipation).  You skin is dry.  Your legs or another part of your body is swollen. Get help right away if:  You feel confused.  Your vision is blurry.  You feel faint or pass out.  You have a severe headache.  You have severe abdominal, pelvic, or  back pain.  You have chest pain, shortness of breath, or an irregular or fast heartbeat.  You are unable to urinate or you urinate less than normal.  You develop abnormal bleeding, such as bleeding from the rectum, vagina, nose, lungs, or nipples.  You vomit blood.  You have thoughts about harming yourself or committing suicide.  You are worried that you might harm someone else. This information is not intended to replace advice given to you by your health care provider. Make sure you discuss any questions you have with your health care provider. Document Released: 04/24/2007 Document Revised: 12/03/2015 Document Reviewed: 10/29/2013 Elsevier Interactive Patient Education  [employer].

## 2017-09-25 NOTE — Progress Notes (Signed)
Patient ID: Gregory [name], male    DOB: [birth year], 84 y.o.   MRN: [id number]  PCP: [name], FNP  Chief Complaint  Patient presents with  . Follow-up    feels tired a lot    Subjective:  HPI Gregory [name] is a 84 y.o. male with prostate cancer, presents for routine follow-up hypertension and fatigue.  He reports overall he is doing well. He reports good appetite in spite of chronic oral chemotherapy treatment with enzalutamide Gillermina Phy). He continues to experience fatigue and reports chronic persistent insomnia. Denies chest pain, dizziness, weakness, or shortness of breath. Continues to tolerate metoprolol for blood pressure control. Reports no routine monitoring of blood pressure. He continues to be followed by oncology services, Dr. Zola Button, MD. He has no other complaints today. Social History   Socioeconomic History  . Marital status: Divorced    Spouse name: Not on file  . Number of children: Not on file  . Years of education: Not on file  . Highest education level: Not on file  Social Needs  . Financial resource strain: Not on file  . Food insecurity - worry: Not on file  . Food insecurity - inability: Not on file  . Transportation needs - medical: Not on file  . Transportation needs - non-medical: Not on file  Occupational History  . Occupation: Retired  Tobacco Use  . Smoking status: Former Smoker    Packs/day: 0.25    Years: 30.00    Pack years: 7.50    Types: Cigarettes    Start date: 07/11/1992  . Smokeless tobacco: Never Used  Substance and Sexual Activity  . Alcohol use: No    Alcohol/week: 0.0 oz  . Drug use: No  . Sexual activity: No  Other Topics Concern  . Not on file  Social History Narrative  . Not on file    Family History  Problem Relation Age of Onset  . Tuberculosis Mother 36  . Other Father 36   Review of Systems Pertinent negative included in HPI  Patient Active Problem List   Diagnosis Date Noted  . Difficult  intravenous access   . Elevated troponin 09/01/2015  . Diastolic dysfunction with acute on chronic heart failure ([place]) 09/01/2015  . AKI (acute kidney injury) ([place]) 08/30/2015  . Hyperkalemia 08/30/2015  . Prostate cancer ([place]) 08/29/2015  . Hematuria   . S/P TURP   . Urinary retention   . Metastatic adenocarcinoma to bone ([place]) 08/21/2015  . Essential hypertension 04/14/2015  . Malignant neoplasm prostate ([place]) 04/14/2015  . Hyperglycemia 04/14/2015  . Other fatigue 04/14/2015    No Known Allergies  Prior to Admission medications   Medication Sig Start Date End Date Taking? Authorizing Provider  alendronate (FOSAMAX) 70 MG tablet Take 70 mg by mouth once a week. Take with a full glass of water on an empty stomach.   Yes [provider]  enzalutamide Gillermina Phy) 40 MG capsule Take 4 capsules (160 mg total) by mouth daily. 06/29/17  Yes Wyatt Portela, MD  metoprolol succinate (TOPROL XL) 100 MG 24 hr tablet Take 1 tablet (100 mg total) by mouth daily. Take with or immediately following a meal. 03/27/17  Yes [name], FNP  Multiple Vitamins-Minerals (MULTIVITAMIN WITH MINERALS) tablet Take 1 tablet by mouth every morning.    Yes [provider]  tamsulosin (FLOMAX) 0.4 MG CAPS capsule Take 0.4 mg by mouth daily. 01/13/17  Yes [provider]  polyethylene glycol (MIRALAX /  GLYCOLAX) packet Take 17 g by mouth daily as needed for mild constipation. Patient not taking: Reported on 09/25/2017 09/05/15   Geradine Girt, DO    Past Medical, Surgical Family and Social History reviewed and updated.    Objective:   Today's Vitals   09/25/17 1310  BP: 135/74  Pulse: 80  Temp: 98.2 F (36.8 C)  TempSrc: Oral  SpO2: 100%  Weight: 209 lb (94.8 kg)  Height: 6' (1.829 m)  PainSc: 0-No pain    Wt Readings from Last 3 Encounters:  09/25/17 209 lb (94.8 kg)  06/29/17 204 lb 3.2 oz (92.6 kg)  05/05/17 206 lb 9.6 oz (93.7 kg)    Physical  Exam Constitutional: Patient appears well-developed and well-nourished. No distress. HENT: Normocephalic, atraumatic, External right and left ear normal. Oropharynx is clear and moist.  Eyes: Conjunctivae and EOM are normal. PERRLA, no scleral icterus. Neck: Normal ROM. Neck supple. No JVD. No tracheal deviation. No thyromegaly. CVS: RRR, S1/S2 +, no murmurs, no gallops, no carotid bruit.  Pulmonary: Effort and breath sounds normal, no stridor, rhonchi, wheezes, rales.  Abdominal: Soft. BS +, no distension, tenderness, rebound or guarding.  Musculoskeletal: Normal range of motion. No edema and no tenderness.  Lymphadenopathy: No lymphadenopathy noted, cervical, inguinal or axillary Neuro: Alert, chronic tremors, impaired muscle tone coordination, impaired gait uses a mobility aid.  Skin: Skin is warm and dry. No rash noted. Not diaphoretic. No erythema. No pallor. Psychiatric: Normal mood and affect. Behavior, judgment, thought content normal. Assessment & Plan:  1. Neoplastic malignant related fatigue, secondary to insomnia. Recommended benadryl 25 mg as needed for sleep. 2. Prostate cancer [facility]) -Patient initially missed oncology appointment scheduled for 09/19/2017, however a new appointment was scheduled 09/29/2017.   3. Essential hypertension, Stable and well-controlled. Pulse rate is stable. We have discussed target BP range and blood pressure goal. I have advised patient to check BP regularly and to call us back or report to clinic if the numbers are consistently higher than 140/90. We discussed the importance of compliance with medical therapy and DASH diet recommended, consequences of uncontrolled hypertension discussed.    Return for follow-up of hypertension in 9 months.     [name]. Kenton Kingfisher, MSN, FNP-C [facility]  [address] [place], [place] [address] [phone]  //

## 2017-09-25 NOTE — Telephone Encounter (Signed)
Spoke with patient and r/s his appointment for 3/22. Had to change that appointment and rescheduled for 3/29. Due to error of provider (switched out [facility], and replaced it with [facility]). Mailed patient a letter and a calender

## 2017-09-29 ENCOUNTER — Other Ambulatory Visit: Payer: Self-pay

## 2017-09-29 ENCOUNTER — Ambulatory Visit: Payer: Self-pay | Admitting: Oncology

## 2017-10-06 ENCOUNTER — Telehealth: Payer: Self-pay | Admitting: Oncology

## 2017-10-06 ENCOUNTER — Inpatient Hospital Stay (HOSPITAL_BASED_OUTPATIENT_CLINIC_OR_DEPARTMENT_OTHER): Payer: Medicare Other | Admitting: Oncology

## 2017-10-06 ENCOUNTER — Inpatient Hospital Stay: Payer: Medicare Other | Attending: Oncology

## 2017-10-06 VITALS — BP 153/106 | HR 49 | Temp 97.7°F | Resp 18 | Ht 72.0 in | Wt 211.4 lb

## 2017-10-06 DIAGNOSIS — C61 Malignant neoplasm of prostate: Secondary | ICD-10-CM

## 2017-10-06 DIAGNOSIS — Z9079 Acquired absence of other genital organ(s): Secondary | ICD-10-CM | POA: Insufficient documentation

## 2017-10-06 DIAGNOSIS — Z79899 Other long term (current) drug therapy: Secondary | ICD-10-CM | POA: Insufficient documentation

## 2017-10-06 DIAGNOSIS — C7951 Secondary malignant neoplasm of bone: Secondary | ICD-10-CM | POA: Diagnosis not present

## 2017-10-06 LAB — CBC WITH DIFFERENTIAL/PLATELET
BASOS ABS: 0 10*3/uL (ref 0.0–0.1)
BASOS PCT: 0 %
Eosinophils Absolute: 0.1 10*3/uL (ref 0.0–0.5)
Eosinophils Relative: 2 %
HEMATOCRIT: 40.1 % (ref 38.4–49.9)
HEMOGLOBIN: 13.1 g/dL (ref 13.0–17.1)
Lymphocytes Relative: 39 %
Lymphs Abs: 2.6 10*3/uL (ref 0.9–3.3)
MCH: 31.2 pg (ref 27.2–33.4)
MCHC: 32.7 g/dL (ref 32.0–36.0)
MCV: 95.5 fL (ref 79.3–98.0)
Monocytes Absolute: 0.5 10*3/uL (ref 0.1–0.9)
Monocytes Relative: 8 %
NEUTROS ABS: 3.3 10*3/uL (ref 1.5–6.5)
NEUTROS PCT: 51 %
Platelets: 189 10*3/uL (ref 140–400)
RBC: 4.2 MIL/uL (ref 4.20–5.82)
RDW: 14.2 % (ref 11.0–14.6)
WBC: 6.6 10*3/uL (ref 4.0–10.3)

## 2017-10-06 LAB — COMPREHENSIVE METABOLIC PANEL
ALK PHOS: 53 U/L (ref 40–150)
ALT: 9 U/L (ref 0–55)
AST: 14 U/L (ref 5–34)
Albumin: 3.6 g/dL (ref 3.5–5.0)
Anion gap: 9 (ref 3–11)
BILIRUBIN TOTAL: 0.5 mg/dL (ref 0.2–1.2)
BUN: 14 mg/dL (ref 7–26)
CALCIUM: 9.9 mg/dL (ref 8.4–10.4)
CO2: 27 mmol/L (ref 22–29)
CREATININE: 1.12 mg/dL (ref 0.70–1.30)
Chloride: 106 mmol/L (ref 98–109)
GFR calc Af Amer: >60 mL/min (ref >60)
GFR calc non Af Amer: 57 mL/min — ABNORMAL LOW (ref >60)
Glucose, Bld: 104 mg/dL (ref 70–140)
Potassium: 4.6 mmol/L (ref 3.5–5.1)
SODIUM: 142 mmol/L (ref 136–145)
TOTAL PROTEIN: 7.1 g/dL (ref 6.4–8.3)

## 2017-10-06 NOTE — Telephone Encounter (Signed)
Scheduled appt per 3/29 los - Gave patient AVS and calender per los. Central radiology will contact patient with ct scan /bone scan

## 2017-10-06 NOTE — Progress Notes (Signed)
Hematology and Oncology Follow Up Visit  Gregory [name] [id number] 1928/11/21 84 y.o. 10/06/2017 9:50 AM [name], FNPHarris, [name], FNP   Principle Diagnosis: 84 year old  man with advanced prostate cancer diagnosed in 2017.  He was found to have Gleason score 4+5 = 9 and a PSA of 18.7.  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    Prior Therapy: He is status post TURP procedure and orchiectomy in February 2017. He also received prophylactic radiation to the right femoral hip for a total of 30 gray in 10 fractions in the care of Dr. Tammi Klippel completed in April 2017.  He developed castration resistant disease in July 2017.  He received Xofigo for a total of 6 treatments completed in March 2018. PSA was 5.15 in 10/26/2016 with castrate level testosterone. .   Current therapy: Xtandi 160 mg daily started in 01/2017.   Interim History: Gregory [name] is here for a follow-up.  Since the last visit he reports no major changes in his health.  He continues to live independently and attends to activities of daily living.  He uses public transportation without any decline in his energy or performance status.  His appetite remain excellent and weight is stable.  He continues to take [facility] without any new complications.  He denied any nausea or excessive fatigue.  Continues to report hot flashes that are manageable.  He has no difficulties obtaining or taking this medication.  He denied any headaches, blurry vision, syncope or seizures. He does not report any fevers, chills, sweats or weight loss. He does not report any chest pain, palpitation, orthopnea or leg edema. He is not report any cough, wheezing or hemoptysis. He is not reporting nausea, vomiting or abdominal pain. He is not reporting frequency urgency or hesitancy. He does not report any hematuria or dysuria. He does not report any bone pain or pathological fractures.  He denies any skin rashes or lesions.  He does not  report any lymphadenopathy or petechiae. Remaining review of systems is negative.   Medications: I have reviewed the patient's current medications.  Current Outpatient Medications  Medication Sig Dispense Refill  . alendronate (FOSAMAX) 70 MG tablet Take 70 mg by mouth once a week. Take with a full glass of water on an empty stomach.    . enzalutamide (XTANDI) 40 MG capsule Take 4 capsules (160 mg total) by mouth daily. 240 capsule 0  . metoprolol succinate (TOPROL XL) 100 MG 24 hr tablet Take 1 tablet (100 mg total) by mouth daily. Take with or immediately following a meal. 90 tablet 3  . Multiple Vitamins-Minerals (MULTIVITAMIN WITH MINERALS) tablet Take 1 tablet by mouth every morning.     . polyethylene glycol (MIRALAX / GLYCOLAX) packet Take 17 g by mouth daily as needed for mild constipation. (Patient not taking: Reported on 09/25/2017) 14 each 0  . tamsulosin (FLOMAX) 0.4 MG CAPS capsule Take 0.4 mg by mouth daily.  11   No current facility-administered medications for this visit.      Allergies: No Known Allergies  Past Medical History, Surgical history, Social history, and Family History reviewed without any changes.  Physical Exam: Blood pressure (!) 153/106, pulse (!) 49, temperature 97.7 F (36.5 C), temperature source Oral, resp. rate 18, height 6' (1.829 m), weight 211 lb 6.4 oz (95.9 kg), SpO2 97 %.   ECOG: 1 General appearance: Alert, awake gentleman appeared comfortable. Head: Atraumatic without abnormalities. Eyes: No scleral icterus. Oropharynx: Without thrush or ulcers.  Lymph nodes:  no lymphadenopathy in the cervical, axillary or supraclavicular areas. Heart: Regular rate and rhythm without any murmurs or gallops. Lung: Clear without any wheezes or dullness to percussion. Abdomin: Soft, nontender without any rebound or guarding.  No shifting dullness or ascites. Musculoskeletal: No joint deformity or effusion. Skin: No rashes or lesions.   Lab Results: Lab  Results  Component Value Date   WBC 7.4 06/29/2017   HGB 13.6 06/29/2017   HCT 41.2 06/29/2017   MCV 92.8 06/29/2017   PLT 182 06/29/2017     Chemistry      Component Value Date/Time   NA 140 06/29/2017 1333   K 4.8 06/29/2017 1333   CL 105 12/23/2016 1444   CO2 25 06/29/2017 1333   BUN 10.2 06/29/2017 1333   CREATININE 1.1 06/29/2017 1333      Component Value Date/Time   CALCIUM 10.1 06/29/2017 1333   ALKPHOS 57 06/29/2017 1333   AST 15 06/29/2017 1333   ALT 9 06/29/2017 1333   BILITOT 0.64 06/29/2017 1333      Results for Gregory [name], Gregory [name] (MRN [id number]) as of 10/06/2017 09:52  Ref. Range 05/05/2017 14:22 06/29/2017 13:33  Prostate Specific Ag, Serum Latest Ref Range: 0.0 - 4.0 ng/mL 1.6 2.1      Impression and Plan:  84 year old man with  1.  Advanced prostate cancer diagnosed in 2017.  He is currently castration resistant with bone involvement.  He is currently on Xtandi 160 mg daily since July 2018 with excellent response to therapy with decline in his PSA from 7.9 to 0.7.  His most recent PSA is 2.1.  The natural course of this disease was discussed today with the patient as well as treatment approach.  I recommended repeating staging workup including CT scan and a bone scan in the near future and consider salvage therapy if he has developed symptomatic progression.  These options would include systemic chemotherapy versus second line hormone therapy such as Zytiga.  The preference is to use Taxotere chemotherapy given his previous exposure to [place].  2. Androgen depravation: His testosterone has been adequately castrate and we will check periodically.  He is status post orchiectomy.  3. Bone directed therapy: He would be at risk of developing skeletal related events such as bone pain and pathological fractures.  We will obtain dental clearance and evaluate him for Xgeva in the near future.  He remains on alendronate for bony protective purposes and osteoporosis  prevention.  4. Prognosis: He has an incurable malignancy but his performance status remain excellent.  Treatment remains palliative but aggressive therapy is warranted.  5. Follow-up: In March 2019.  25  minutes was spent with the patient face-to-face today.  More than 50% of time was dedicated to patient counseling, education and coordination of his multifaceted care.    Zola Button, MD 3/29/20199:50 AM

## 2017-10-07 LAB — PROSTATE-SPECIFIC AG, SERUM (LABCORP): PROSTATE SPECIFIC AG, SERUM: 4.5 ng/mL — AB (ref 0.0–4.0)

## 2017-10-09 ENCOUNTER — Other Ambulatory Visit: Payer: Self-pay | Admitting: *Deleted

## 2017-10-09 DIAGNOSIS — C61 Malignant neoplasm of prostate: Secondary | ICD-10-CM

## 2017-10-09 MED ORDER — ENZALUTAMIDE 40 MG PO CAPS: 160.0000 mg | ORAL_CAPSULE | Freq: Every day | ORAL | 0 refills | Status: DC

## 2017-11-14 ENCOUNTER — Other Ambulatory Visit: Payer: Self-pay | Admitting: *Deleted

## 2017-11-14 DIAGNOSIS — C61 Malignant neoplasm of prostate: Secondary | ICD-10-CM

## 2017-11-14 MED ORDER — ENZALUTAMIDE 40 MG PO CAPS: 160.0000 mg | ORAL_CAPSULE | Freq: Every day | ORAL | 0 refills | Status: DC

## 2017-11-15 ENCOUNTER — Encounter: Payer: Self-pay | Admitting: *Deleted

## 2017-12-13 ENCOUNTER — Other Ambulatory Visit: Payer: Self-pay | Admitting: *Deleted

## 2017-12-13 DIAGNOSIS — C61 Malignant neoplasm of prostate: Secondary | ICD-10-CM

## 2017-12-13 MED ORDER — ENZALUTAMIDE 40 MG PO CAPS: 160.0000 mg | ORAL_CAPSULE | Freq: Every day | ORAL | 0 refills | Status: DC

## 2017-12-28 ENCOUNTER — Other Ambulatory Visit: Payer: Self-pay

## 2018-01-01 ENCOUNTER — Encounter (HOSPITAL_COMMUNITY)
Admission: RE | Admit: 2018-01-01 | Discharge: 2018-01-01 | Disposition: A | Discharge status: Home / Self Care | Payer: Medicare Other | Source: Ambulatory Visit | Attending: Oncology | Admitting: Oncology

## 2018-01-01 ENCOUNTER — Encounter (HOSPITAL_COMMUNITY): Payer: Self-pay

## 2018-01-01 ENCOUNTER — Inpatient Hospital Stay: Payer: Medicare Other | Attending: Oncology

## 2018-01-01 ENCOUNTER — Ambulatory Visit (HOSPITAL_COMMUNITY)
Admission: RE | Admit: 2018-01-01 | Discharge: 2018-01-01 | Disposition: A | Discharge status: Home / Self Care | Payer: Medicare Other | Source: Ambulatory Visit | Attending: Oncology | Admitting: Oncology

## 2018-01-01 DIAGNOSIS — Z9079 Acquired absence of other genital organ(s): Secondary | ICD-10-CM | POA: Insufficient documentation

## 2018-01-01 DIAGNOSIS — C61 Malignant neoplasm of prostate: Secondary | ICD-10-CM | POA: Insufficient documentation

## 2018-01-01 DIAGNOSIS — Z79899 Other long term (current) drug therapy: Secondary | ICD-10-CM | POA: Diagnosis not present

## 2018-01-01 DIAGNOSIS — C7951 Secondary malignant neoplasm of bone: Secondary | ICD-10-CM | POA: Insufficient documentation

## 2018-01-01 DIAGNOSIS — I700 Atherosclerosis of aorta: Secondary | ICD-10-CM | POA: Diagnosis not present

## 2018-01-01 DIAGNOSIS — Z923 Personal history of irradiation: Secondary | ICD-10-CM | POA: Insufficient documentation

## 2018-01-01 LAB — CMP (CANCER CENTER ONLY)
ALBUMIN: 3.9 g/dL (ref 3.5–5.0)
ALK PHOS: 58 U/L (ref 40–150)
ALT: 6 U/L (ref 0–55)
ANION GAP: 10 (ref 3–11)
AST: 13 U/L (ref 5–34)
BILIRUBIN TOTAL: 0.6 mg/dL (ref 0.2–1.2)
BUN: 10 mg/dL (ref 7–26)
CALCIUM: 9.8 mg/dL (ref 8.4–10.4)
CO2: 25 mmol/L (ref 22–29)
CREATININE: 1.12 mg/dL (ref 0.70–1.30)
Chloride: 106 mmol/L (ref 98–109)
GFR, Estimated: 57 mL/min — ABNORMAL LOW (ref >60)
GLUCOSE: 135 mg/dL (ref 70–140)
Potassium: 4.2 mmol/L (ref 3.5–5.1)
SODIUM: 141 mmol/L (ref 136–145)
TOTAL PROTEIN: 7.2 g/dL (ref 6.4–8.3)

## 2018-01-01 LAB — CBC WITH DIFFERENTIAL (CANCER CENTER ONLY)
BASOS ABS: 0 10*3/uL (ref 0.0–0.1)
BASOS PCT: 0 %
Eosinophils Absolute: 0.1 10*3/uL (ref 0.0–0.5)
Eosinophils Relative: 3 %
HCT: 40.4 % (ref 38.4–49.9)
Hemoglobin: 13.1 g/dL (ref 13.0–17.1)
Lymphocytes Relative: 33 %
Lymphs Abs: 1.9 10*3/uL (ref 0.9–3.3)
MCH: 30.3 pg (ref 27.2–33.4)
MCHC: 32.5 g/dL (ref 32.0–36.0)
MCV: 93.1 fL (ref 79.3–98.0)
MONO ABS: 0.4 10*3/uL (ref 0.1–0.9)
MONOS PCT: 6 %
NEUTROS ABS: 3.3 10*3/uL (ref 1.5–6.5)
Neutrophils Relative %: 58 %
PLATELETS: 183 10*3/uL (ref 140–400)
RBC: 4.34 MIL/uL (ref 4.20–5.82)
RDW: 14.3 % (ref 11.0–14.6)
WBC Count: 5.7 10*3/uL (ref 4.0–10.3)

## 2018-01-01 MED ORDER — IOPAMIDOL (ISOVUE-300) INJECTION 61%
INTRAVENOUS | Status: AC
  Filled 2018-01-01: qty 100

## 2018-01-01 MED ORDER — TECHNETIUM TC 99M MEDRONATE IV KIT
20.0000 | PACK | Freq: Once | INTRAVENOUS | Status: AC | PRN
  Administered 2018-01-01: 20 via INTRAVENOUS

## 2018-01-01 MED ORDER — IOPAMIDOL (ISOVUE-300) INJECTION 61%
100.0000 mL | Freq: Once | INTRAVENOUS | Status: AC | PRN
  Administered 2018-01-01: 100 mL via INTRAVENOUS

## 2018-01-02 LAB — TESTOSTERONE: TESTOSTERONE: 22 ng/dL — AB (ref 264–916)

## 2018-01-02 LAB — PROSTATE-SPECIFIC AG, SERUM (LABCORP): PROSTATE SPECIFIC AG, SERUM: 7.7 ng/mL — AB (ref 0.0–4.0)

## 2018-01-04 ENCOUNTER — Telehealth: Payer: Self-pay

## 2018-01-04 ENCOUNTER — Inpatient Hospital Stay (HOSPITAL_BASED_OUTPATIENT_CLINIC_OR_DEPARTMENT_OTHER): Payer: Medicare Other | Admitting: Oncology

## 2018-01-04 VITALS — BP 142/83 | HR 127 | Temp 97.8°F | Resp 19 | Ht 72.0 in | Wt 208.6 lb

## 2018-01-04 DIAGNOSIS — C61 Malignant neoplasm of prostate: Secondary | ICD-10-CM

## 2018-01-04 DIAGNOSIS — Z9079 Acquired absence of other genital organ(s): Secondary | ICD-10-CM | POA: Diagnosis not present

## 2018-01-04 DIAGNOSIS — C7951 Secondary malignant neoplasm of bone: Secondary | ICD-10-CM

## 2018-01-04 DIAGNOSIS — Z923 Personal history of irradiation: Secondary | ICD-10-CM

## 2018-01-04 DIAGNOSIS — Z79899 Other long term (current) drug therapy: Secondary | ICD-10-CM

## 2018-01-04 NOTE — Telephone Encounter (Signed)
Printed avs and calender of upcoming appointment. per 6/27 los

## 2018-01-04 NOTE — Progress Notes (Signed)
Hematology and Oncology Follow Up Visit  Gregory [name] [id number] 10-05-1928 84 y.o. 01/04/2018 9:11 AM Gregory [name], FNPHarris, [name], FNP   Principle Diagnosis: 84 year old man with castration-resistant prostate cancer with disease to the bone diagnosed in 2017.  His initial diagnosis showed Gleason score 4+5 = 9 and a PSA of 18.7.     Prior Therapy: He is status post TURP procedure and orchiectomy in February 2017. He also received prophylactic radiation to the right femoral hip for a total of 30 gray in 10 fractions in the care of Dr. Tammi Klippel completed in April 2017.  He developed castration resistant disease in July 2017.  He received Xofigo for a total of 6 treatments completed in March 2018. PSA was 5.15 in 10/26/2016 with castrate level testosterone. .   Current therapy: Xtandi 160 mg daily started in 01/2017.   Interim History: Gregory [name] returns today for a follow-up.  He continues to enjoy a reasonable quality of life with few complications related to [place].  He has reported increased fatigue as well as tiredness in addition to worsening hot flashes.  His quality of life remains intact ambulating regularly without any falls or syncope.  He denies any bone pain or pathological fractures.  He denies any decline in his energy her performance status.  Appetite is excellent and weight is stable.  He denied any headaches, blurry vision, syncope or seizures.  He denies any alteration in mental status or confusion.  He does not report any fevers, chills, sweats He does not report any chest pain, palpitation, orthopnea or leg edema. He is not report any cough, wheezing or hemoptysis. He is not reporting nausea, vomiting or abdominal pain. He is not reporting frequency urgency or hesitancy. He does not report any hematuria or dysuria. He does not report any bone pain or pathological fractures.  He denies any skin rashes or lesions.  He does not report any lymphadenopathy or  petechiae.  He denies any bleeding or clotting tendencies.  Remaining review of systems is negative.   Medications: I have reviewed the patient's current medications.  Current Outpatient Medications  Medication Sig Dispense Refill  . alendronate (FOSAMAX) 70 MG tablet Take 70 mg by mouth once a week. Take with a full glass of water on an empty stomach.    . enzalutamide (XTANDI) 40 MG capsule Take 4 capsules (160 mg total) by mouth daily. 120 capsule 0  . metoprolol succinate (TOPROL XL) 100 MG 24 hr tablet Take 1 tablet (100 mg total) by mouth daily. Take with or immediately following a meal. 90 tablet 3  . Multiple Vitamins-Minerals (MULTIVITAMIN WITH MINERALS) tablet Take 1 tablet by mouth every morning.     . polyethylene glycol (MIRALAX / GLYCOLAX) packet Take 17 g by mouth daily as needed for mild constipation. 14 each 0  . tamsulosin (FLOMAX) 0.4 MG CAPS capsule Take 0.4 mg by mouth daily.  11   No current facility-administered medications for this visit.      Allergies: No Known Allergies  Past Medical History, Surgical history, Social history, and Family History reviewed without any changes.  Physical Exam: Blood pressure (!) 142/83, pulse (!) 127, temperature 97.8 F (36.6 C), temperature source Oral, resp. rate 19, height 6' (1.829 m), weight 208 lb 9.6 oz (94.6 kg), SpO2 100 %.   ECOG: 1 General appearance: Well-appearing gentleman without distress. Head: Normocephalic without normalities. Eyes: Pulls are equal and round reactive to light. Oropharynx: No oral thrush or ulcers. Lymph  nodes:  no cervical, axillary, inguinal or supraclavicular lymphadenopathy. Heart: Regular rate and rhythm without any murmurs or gallops.  No leg edema. Lung: Clear in all lung fields without any rhonchi, wheezes or dullness to percussion. Abdomin: Soft, without any rebound or guarding.  No shifting dullness or ascites. Musculoskeletal: No clubbing or cyanosis. Skin: No petechia or  ecchymosis. Neurological: No deficits noted on motor, sensory exam.   Lab Results: Lab Results  Component Value Date   WBC 5.7 01/01/2018   HGB 13.1 01/01/2018   HCT 40.4 01/01/2018   MCV 93.1 01/01/2018   PLT 183 01/01/2018     Chemistry      Component Value Date/Time   NA 141 01/01/2018 0929   NA 140 06/29/2017 1333   K 4.2 01/01/2018 0929   K 4.8 06/29/2017 1333   CL 106 01/01/2018 0929   CO2 25 01/01/2018 0929   CO2 25 06/29/2017 1333   BUN 10 01/01/2018 0929   BUN 10.2 06/29/2017 1333   CREATININE 1.12 01/01/2018 0929   CREATININE 1.1 06/29/2017 1333      Component Value Date/Time   CALCIUM 9.8 01/01/2018 0929   CALCIUM 10.1 06/29/2017 1333   ALKPHOS 58 01/01/2018 0929   ALKPHOS 57 06/29/2017 1333   AST 13 01/01/2018 0929   AST 15 06/29/2017 1333   ALT 6 01/01/2018 0929   ALT 9 06/29/2017 1333   BILITOT 0.6 01/01/2018 0929   BILITOT 0.64 06/29/2017 1333     Results for Gregory [name] (MRN [id number]) as of 01/04/2018 09:06  Ref. Range 10/06/2017 09:41 01/01/2018 09:29  Prostate Specific Ag, Serum Latest Ref Range: 0.0 - 4.0 ng/mL 4.5 (H) 7.7 (H)    EXAM: NUCLEAR MEDICINE WHOLE BODY BONE SCAN  TECHNIQUE: Whole body anterior and posterior images were obtained approximately 3 hours after intravenous injection of radiopharmaceutical.  RADIOPHARMACEUTICALS:  20.0 mCi Technetium-77m MDP IV  COMPARISON:  01/01/2018 CT.  04/21/2017 and 02/01/2016 bone scan.  FINDINGS: Similar appearance to the 04/21/2017 examination without new area of radiotracer uptake to suggest progressive metastatic disease.  Minimal to mild increased radiotracer uptake proximal femurs (greater on the right), ilium greater on left, proximal left humerus and T3 vertebra similar to prior exam which may represent residua of osseous metastatic disease as noted on 02/01/2016 exam.  Multiple sclerotic foci as noted on CT, majority of which do not demonstrate radiotracer uptake and  may represent treated osseous metastatic disease.  Prominent left lateral osteophyte L2-3 level once again noted.  Both kidneys visualized.  IMPRESSION: No new areas of radiotracer uptake to suggest progression of osseous metastatic disease (when compared to 04/21/2017 bone scan).  Minimal to mild increased radiotracer uptake proximal femurs (greater on the right), ilium greater on left, proximal left humerus and T3 vertebra similar to prior exam which may represent residua of osseous metastatic disease.     Impression and Plan:  84 year old man with  1.  Castration-resistant prostate cancer with disease to the bone documented in 2018 after he was initially diagnosed in 2017.    His PSA continues to rise rather slowly although he is asymptomatic from his disease.  CT scan and bone scan obtained on 01/01/2018 was personally reviewed and showed no rapid progression of his disease.  Options of therapy were reviewed today which include switching him to a different agent such as systemic chemotherapy or Xofigo versus continuing Xtandi.  After discussion today, he elected to continue with Xtandi but a lower dose.  He is experiencing  more side effects associated with it and he is asymptomatic from his cancer to warrant systemic chemotherapy or Xofigo.  We have elected to continue with Xtandi at 80 mg daily for the time being.  2. Androgen depravation: He is status post orchiectomy.  His testosterone level remains very low.  3. Bone directed therapy: No skeletal related events noted.  He remains on alendronate.  4. Prognosis: Despite his age, his performance status remains excellent and aggressive therapy is warranted for his incurable malignancy.  5. Follow-up: In September 2019.  15  minutes was spent with the patient face-to-face today.  More than 50% of time was dedicated to patient counseling, education and discussing future option of treatment.    Zola Button,  MD 6/27/20199:11 AM

## 2018-02-05 ENCOUNTER — Other Ambulatory Visit: Payer: Self-pay | Admitting: *Deleted

## 2018-02-05 DIAGNOSIS — C61 Malignant neoplasm of prostate: Secondary | ICD-10-CM

## 2018-02-05 MED ORDER — ENZALUTAMIDE 40 MG PO CAPS: 160.0000 mg | ORAL_CAPSULE | Freq: Every day | ORAL | 0 refills | Status: DC

## 2018-03-14 ENCOUNTER — Telehealth: Payer: Self-pay | Admitting: General Practice

## 2018-03-14 NOTE — Telephone Encounter (Signed)
[place] CSW Progress Notes  Wants help w medical verification form for [employer], will fax to CSW for completion and asked CSW to submit to [employer] directly.  Will submit upon receipt of form.   Edwyna Shell, LCSW Clinical Social Worker Phone:  [phone]

## 2018-03-15 ENCOUNTER — Telehealth: Payer: Self-pay | Admitting: *Deleted

## 2018-03-15 ENCOUNTER — Encounter: Payer: Self-pay | Admitting: General Practice

## 2018-03-15 NOTE — Progress Notes (Signed)
Gregory [name]  [employer] application faxed to [employer] on patient behalf at daughter's request. Patient provided verbal consent by phone to release form to [employer].  Edwyna Shell, LCSW Clinical Social Worker Phone:  [phone]

## 2018-03-15 NOTE — Telephone Encounter (Signed)
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 I need some disability papers filled out so, I can switch him to [employer] ([employer]). Received faxed paperwork and placed in the box for FMLA/Disability to fill out.

## 2018-03-22 NOTE — Progress Notes (Signed)
Paratransit eligibility forms successfully faxed to [employer] at [phone]. Mailed copy to patient address on file.

## 2018-04-06 ENCOUNTER — Inpatient Hospital Stay: Payer: Medicare Other | Attending: Oncology | Admitting: Oncology

## 2018-04-06 ENCOUNTER — Inpatient Hospital Stay: Payer: Medicare Other

## 2018-04-06 ENCOUNTER — Telehealth: Payer: Self-pay | Admitting: Oncology

## 2018-04-06 ENCOUNTER — Other Ambulatory Visit: Payer: Self-pay

## 2018-04-06 VITALS — BP 135/87 | HR 119 | Temp 98.0°F | Resp 18 | Ht 72.0 in | Wt 208.6 lb

## 2018-04-06 DIAGNOSIS — C61 Malignant neoplasm of prostate: Secondary | ICD-10-CM | POA: Diagnosis present

## 2018-04-06 DIAGNOSIS — C7951 Secondary malignant neoplasm of bone: Secondary | ICD-10-CM | POA: Diagnosis not present

## 2018-04-06 DIAGNOSIS — Z9079 Acquired absence of other genital organ(s): Secondary | ICD-10-CM | POA: Diagnosis not present

## 2018-04-06 DIAGNOSIS — R9721 Rising PSA following treatment for malignant neoplasm of prostate: Secondary | ICD-10-CM | POA: Insufficient documentation

## 2018-04-06 DIAGNOSIS — E348 Other specified endocrine disorders: Secondary | ICD-10-CM | POA: Insufficient documentation

## 2018-04-06 DIAGNOSIS — Z923 Personal history of irradiation: Secondary | ICD-10-CM | POA: Insufficient documentation

## 2018-04-06 DIAGNOSIS — Z79899 Other long term (current) drug therapy: Secondary | ICD-10-CM | POA: Insufficient documentation

## 2018-04-06 LAB — CMP (CANCER CENTER ONLY)
ALT: 8 U/L (ref 0–44)
AST: 13 U/L — ABNORMAL LOW (ref 15–41)
Albumin: 4 g/dL (ref 3.5–5.0)
Alkaline Phosphatase: 55 U/L (ref 38–126)
Anion gap: 13 (ref 5–15)
BUN: 11 mg/dL (ref 8–23)
CO2: 28 mmol/L (ref 22–32)
Calcium: 10.3 mg/dL (ref 8.9–10.3)
Chloride: 106 mmol/L (ref 98–111)
Creatinine: 1.21 mg/dL (ref 0.61–1.24)
GFR, Est AFR Am: >60 mL/min (ref >60)
GFR, Estimated: 52 mL/min — ABNORMAL LOW (ref >60)
Glucose, Bld: 144 mg/dL — ABNORMAL HIGH (ref 70–99)
Potassium: 4.7 mmol/L (ref 3.5–5.1)
Sodium: 147 mmol/L — ABNORMAL HIGH (ref 135–145)
Total Bilirubin: 0.6 mg/dL (ref 0.3–1.2)
Total Protein: 7.6 g/dL (ref 6.5–8.1)

## 2018-04-06 LAB — CBC WITH DIFFERENTIAL (CANCER CENTER ONLY)
BASOS ABS: 0 10*3/uL (ref 0.0–0.1)
Basophils Relative: 0 %
EOS ABS: 0.2 10*3/uL (ref 0.0–0.5)
EOS PCT: 3 %
HCT: 41.3 % (ref 38.4–49.9)
Hemoglobin: 13.5 g/dL (ref 13.0–17.1)
Lymphocytes Relative: 33 %
Lymphs Abs: 2.4 10*3/uL (ref 0.9–3.3)
MCH: 30.2 pg (ref 27.2–33.4)
MCHC: 32.6 g/dL (ref 32.0–36.0)
MCV: 92.6 fL (ref 79.3–98.0)
Monocytes Absolute: 0.5 10*3/uL (ref 0.1–0.9)
Monocytes Relative: 7 %
Neutro Abs: 4.1 10*3/uL (ref 1.5–6.5)
Neutrophils Relative %: 57 %
PLATELETS: 194 10*3/uL (ref 140–400)
RBC: 4.46 MIL/uL (ref 4.20–5.82)
RDW: 14.4 % (ref 11.0–14.6)
WBC: 7.2 10*3/uL (ref 4.0–10.3)

## 2018-04-06 MED ORDER — ENZALUTAMIDE 40 MG PO CAPS: 160.0000 mg | ORAL_CAPSULE | Freq: Every day | ORAL | 0 refills | Status: DC

## 2018-04-06 NOTE — Progress Notes (Signed)
Hematology and Oncology Follow Up Visit  Gregory [name] [id number] [birth year] 84 y.o. 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  He was initially diagnosed with Gleason score 4+5 = 9 and a PSA of 18.7 in 2017.   Prior Therapy: He is status post TURP procedure and orchiectomy in February 2017. He also received prophylactic radiation to the right femoral hip for a total of 30 gray in 10 fractions in the care of Dr. Tammi Klippel completed in April 2017.  He developed castration resistant disease in July 2017.  He received Xofigo for a total of 6 treatments completed in March 2018. PSA was 5.15 in 10/26/2016 with castrate level testosterone. .   Current therapy: Xtandi 160 mg daily started in 01/2017.   Interim History: Gregory [name] is here for a follow-up visit.  Since the last visit, he reports no major changes in his health.  He continues to take Xtandi without any recent complications or issues.  His appetite remain excellent and his weight is manageable.  He still ambulates with the help of a cane without any falls or syncope.  His performance status and activity level remains unchanged.  He denied any headaches, blurry vision, syncope or seizures.  He denies any dizziness or confusion.  He does not report any fevers, chills, sweats He does not report any chest pain, palpitation, orthopnea or leg edema. He is not report any cough, wheezing or hemoptysis. He is not reporting nausea, vomiting or abdominal pain.  He denies any change in his bowel habits.  He denies any hematochezia or melena.  He is not reporting frequency urgency or hesitancy. He does not report any hematuria or dysuria. He does not report any arthralgias or myalgias.  He denies any skin rashes or lesions.  He does not report any lymphadenopathy or petechiae.  He denies any skin rashes or lesions.   Remaining review of systems is negative.   Medications: I have reviewed the patient's current medications.  Current Outpatient Medications  Medication Sig Dispense Refill  . alendronate (FOSAMAX) 70 MG tablet Take 70 mg by mouth once a week. Take with a full glass of water on an empty stomach.    . enzalutamide (XTANDI) 40 MG capsule Take 4 capsules (160 mg total) by mouth daily. 120 capsule 0  . metoprolol succinate (TOPROL XL) 100 MG 24 hr tablet Take 1 tablet (100 mg total) by mouth daily. Take with or immediately following a meal. 90 tablet 3  . Multiple Vitamins-Minerals (MULTIVITAMIN WITH MINERALS) tablet Take 1 tablet by mouth every morning.     . polyethylene glycol (MIRALAX / GLYCOLAX) packet Take 17 g by mouth daily as needed for mild constipation. 14 each 0  . tamsulosin (FLOMAX) 0.4 MG CAPS capsule Take 0.4 mg by mouth daily.  11   No current facility-administered medications for this visit.      Allergies: No Known Allergies  Past Medical History, Surgical history, Social history, and Family History reviewed without any changes.  Physical Exam:  ECOG: 1   General appearance: Comfortable appearing without any discomfort Head: Normocephalic without any trauma Oropharynx: Mucous membranes are moist and pink without any thrush or ulcers. Eyes: Pupils are equal and round reactive to light. Lymph nodes: No cervical, supraclavicular, inguinal or axillary lymphadenopathy.   Heart:regular rate and rhythm.  S1 and S2 without leg edema. Lung: Clear without any rhonchi  or wheezes.  No dullness to percussion. Abdomin: Soft, nontender, nondistended with good bowel sounds.  No hepatosplenomegaly. Musculoskeletal: No joint deformity or effusion.  Full range of motion noted. Neurological: No deficits noted on motor, sensory and deep tendon reflex exam. Skin: No petechial rash or dryness.  Appeared moist.     Lab Results: Lab Results  Component Value Date   WBC 5.7 01/01/2018    HGB 13.1 01/01/2018   HCT 40.4 01/01/2018   MCV 93.1 01/01/2018   PLT 183 01/01/2018     Chemistry      Component Value Date/Time   NA 141 01/01/2018 0929   NA 140 06/29/2017 1333   K 4.2 01/01/2018 0929   K 4.8 06/29/2017 1333   CL 106 01/01/2018 0929   CO2 25 01/01/2018 0929   CO2 25 06/29/2017 1333   BUN 10 01/01/2018 0929   BUN 10.2 06/29/2017 1333   CREATININE 1.12 01/01/2018 0929   CREATININE 1.1 06/29/2017 1333      Component Value Date/Time   CALCIUM 9.8 01/01/2018 0929   CALCIUM 10.1 06/29/2017 1333   ALKPHOS 58 01/01/2018 0929   ALKPHOS 57 06/29/2017 1333   AST 13 01/01/2018 0929   AST 15 06/29/2017 1333   ALT 6 01/01/2018 0929   ALT 9 06/29/2017 1333   BILITOT 0.6 01/01/2018 0929   BILITOT 0.64 06/29/2017 1333       Impression and Plan:  84 year old man with  1.  Castration-resistant prostate cancer with disease to the bone documented in 2018 after he was initially diagnosed in 2017.    He continues to be on Xtandi despite a slow rise in his PSA.  Imaging studies obtained in June 2019 showed very little changes in his disease status.  Risks and benefits of continuing this medication was reviewed again and is agreeable to continue.  Different salvage therapy may be needed in the future if his PSA continues to rise.  2. Androgen depravation: His testosterone continues to be at a castrate level after orchiectomy.  Last testosterone on January 01, 2018 was less than 22.  3. Bone directed therapy: Continues to be on alendronate without any complications.  No new bone pain or pathological fractures.  4. Prognosis: His disease is incurable but his performance status is excellent and aggressive therapy is warranted.  5. Follow-up: In January 2020.  15  minutes was spent with the patient face-to-face today.  More than 50% of time was dedicated to reviewing his disease status, including imaging studies, laboratory data and coordinating plan of care.    Zola Button, MD 9/27/20199:13 AM

## 2018-04-06 NOTE — Telephone Encounter (Signed)
Appts scheduled AVS/Calendar printed per 9/27 los °

## 2018-04-07 LAB — PROSTATE-SPECIFIC AG, SERUM (LABCORP): Prostate Specific Ag, Serum: 13.7 ng/mL — ABNORMAL HIGH (ref 0.0–4.0)

## 2018-05-29 ENCOUNTER — Telehealth: Payer: Self-pay

## 2018-05-29 NOTE — Telephone Encounter (Signed)
Oral Oncology Patient Advocate Encounter  I received a fax from [employer] to renew the manufacturer assistance application for 2020. I called the patient to make sure his insurance, income and address was still the same and it is. Dr. Shadad signed the application and I faxed it today 05/29/18.  This encounter will be updated until final determination.  Kamarii Buren CPHT [facility] Pharm[facility] Patient Advocate [facility] Phone [phone] Fax [phone] 

## 2018-06-04 ENCOUNTER — Other Ambulatory Visit: Payer: Self-pay | Admitting: Oncology

## 2018-06-04 DIAGNOSIS — C61 Malignant neoplasm of prostate: Secondary | ICD-10-CM

## 2018-06-27 ENCOUNTER — Ambulatory Visit (INDEPENDENT_AMBULATORY_CARE_PROVIDER_SITE_OTHER): Payer: Medicare Other | Admitting: Family Medicine

## 2018-06-27 ENCOUNTER — Encounter: Payer: Self-pay | Admitting: Family Medicine

## 2018-06-27 VITALS — BP 154/96 | HR 96 | Temp 98.0°F | Ht 72.0 in | Wt 210.6 lb

## 2018-06-27 DIAGNOSIS — Z23 Encounter for immunization: Secondary | ICD-10-CM | POA: Diagnosis not present

## 2018-06-27 DIAGNOSIS — H539 Unspecified visual disturbance: Secondary | ICD-10-CM | POA: Diagnosis not present

## 2018-06-27 DIAGNOSIS — Z131 Encounter for screening for diabetes mellitus: Secondary | ICD-10-CM | POA: Diagnosis not present

## 2018-06-27 DIAGNOSIS — E860 Dehydration: Secondary | ICD-10-CM

## 2018-06-27 DIAGNOSIS — I10 Essential (primary) hypertension: Secondary | ICD-10-CM | POA: Diagnosis not present

## 2018-06-27 DIAGNOSIS — Z09 Encounter for follow-up examination after completed treatment for conditions other than malignant neoplasm: Secondary | ICD-10-CM

## 2018-06-27 DIAGNOSIS — C61 Malignant neoplasm of prostate: Secondary | ICD-10-CM | POA: Diagnosis not present

## 2018-06-27 DIAGNOSIS — H9193 Unspecified hearing loss, bilateral: Secondary | ICD-10-CM | POA: Diagnosis not present

## 2018-06-27 LAB — POCT URINALYSIS DIP (MANUAL ENTRY)
Bilirubin, UA: NEGATIVE
Blood, UA: NEGATIVE
Glucose, UA: NEGATIVE mg/dL
Ketones, POC UA: NEGATIVE mg/dL
Leukocytes, UA: NEGATIVE
Nitrite, UA: NEGATIVE
Protein Ur, POC: NEGATIVE mg/dL
Spec Grav, UA: <=1.005 — AB (ref 1.010–1.025)
Urobilinogen, UA: 0.2 E.U./dL
pH, UA: 6 (ref 5.0–8.0)

## 2018-06-27 NOTE — Progress Notes (Signed)
Annual Physical  Subjective:    Patient ID: Gregory [name], male    DOB: [birth year], 84 y.o.   MRN: [id number]  Chief Complaint  Patient presents with  . Medicare Wellness    A1C 5.5   HPI  Gregory [name] is a 84 year old male with a past medical history of Prostate Cancer, Hypertension, GERD, BPH, Bone Metastases. He is here today for his annual physical.   Current Status: Since his last office visit, he is doing well with no complaints. He arrives with the assistance of a cane.  He recently relocated from [place], [place], when his wife pasted away 4 years ago. He continues to follow up with Urology and Oncology for Prostate Cancer.  He denies visual changes, chest pain, cough, shortness of breath, heart palpitations, and falls. He has occasionally headaches and dizziness with position changes. Denies severe headaches, confusion, seizures, double vision, and blurred vision, nausea and vomiting.  He denies fevers, chills, fatigue, recent infections, weight loss, and night sweats. No reports of GI problems such as nausea, vomiting, diarrhea, and constipation. He has no reports of blood in stools, dysuria and hematuria. No depression or anxiety reported. He denies pain today.    Review of Systems  Constitutional: Negative.   HENT: Positive for hearing loss.   Eyes: Positive for visual disturbance.  Respiratory: Positive for shortness of breath.   Cardiovascular: Negative.   Gastrointestinal: Negative.   Endocrine: Negative.   Genitourinary: Negative.   Musculoskeletal: Negative.   Skin: Negative.   Allergic/Immunologic: Negative.   Neurological: Positive for dizziness and headaches.  Hematological: Negative.   Psychiatric/Behavioral: Negative.    Objective:   Physical Exam Constitutional:      Appearance: Normal appearance. He is normal weight.  HENT:     Head: Normocephalic and atraumatic.     Nose: Nose normal.     Mouth/Throat:     Mouth: Mucous membranes are moist.      Pharynx: Oropharynx is clear.  Eyes:     Extraocular Movements: Extraocular movements intact.     Conjunctiva/sclera: Conjunctivae normal.     Pupils: Pupils are equal, round, and reactive to light.  Neck:     Musculoskeletal: Normal range of motion and neck supple.  Cardiovascular:     Rate and Rhythm: Normal rate. Rhythm irregular.  Pulmonary:     Effort: Pulmonary effort is normal.     Breath sounds: Normal breath sounds.  Abdominal:     General: Abdomen is flat. Bowel sounds are normal.     Palpations: Abdomen is soft.  Musculoskeletal: Normal range of motion.  Skin:    General: Skin is warm and dry.  Neurological:     General: No focal deficit present.     Mental Status: He is alert and oriented to person, place, and time.  Psychiatric:        Mood and Affect: Mood normal.        Behavior: Behavior normal.        Thought Content: Thought content normal.        Judgment: Judgment normal.    Assessment & Plan:   1. Essential hypertension Continue Metoprolol as prescribed. He will continue to decrease high sodium intake, excessive alcohol intake, increase potassium intake, smoking cessation, and increase physical activity of at least 30 minutes of cardio activity daily. He will continue to follow Heart Healthy or DASH diet. - POCT urinalysis dipstick  2. Malignant neoplasm prostate ([place]) Continue to follow up with Urology  and Oncology as needed. Continue Xtandi and Flomax as prescribed.   3. Prostate cancer ([place])  4. Visual changes Patient refuses Optometry referral as this time.   5. Hearing decreased, bilateral Mild hearing loss r/t increasing age.   6. Dehydration He will increase fluids.   7. Screening for diabetes mellitus - POCT urinalysis dipstick  8. Need for immunization against influenza - Flu Vaccine QUAD 36+ mos IM  9. Need for pneumococcal vaccination - Pneumococcal conjugate vaccine 13-valent IM  10. Follow up He will follow up in 6 months.   No orders of the defined types were placed in this encounter.  Gregory Becton,  MSN, FNP-C Patient [place] [address] [place], [place] [address] [phone]

## 2018-07-06 NOTE — Telephone Encounter (Signed)
Oral Oncology Patient Advocate Encounter  I called [employer] to follow up on the status of the renewal application. Gregory [name] needs income documents from the patient.  I called the patient and he will be bringing in the document on Tuesday 07/10/18.  [place] Patient [place] Phone [phone] Fax [phone]

## 2018-07-10 NOTE — Telephone Encounter (Signed)
Oral Oncology Patient Advocate Encounter  I met Gregory [name] in the lobby today 07/10/18. He brought his SSI letter and I made a copy of it. I faxed his income document to [place] today 07/10/18.  This encounter will be updated until final determination  [place] Patient [place] Phone [phone] Fax [phone]

## 2018-07-16 ENCOUNTER — Other Ambulatory Visit: Payer: Self-pay | Admitting: Oncology

## 2018-07-16 DIAGNOSIS — C61 Malignant neoplasm of prostate: Secondary | ICD-10-CM

## 2018-07-17 ENCOUNTER — Telehealth: Payer: Self-pay

## 2018-07-17 NOTE — Telephone Encounter (Signed)
Oral Oncology Patient Advocate Encounter  I called [employer] to follow up and they have put Medicare patients applications on hold for now due to grant funding being open for prostate cancer.  I was able to secure the patient a grant and the information will be in a separate encounter. Gillermina Phy will now be filled at [facility].  [place] Patient [place] Phone [phone] Fax [phone]

## 2018-07-17 NOTE — Telephone Encounter (Signed)
Oral Oncology Patient Advocate Encounter  I was successful at securing a grant with [name] (PAF) for [phone]. This will keep the out of pocket expense for Xtandi at $0. The grant information is as follows and has been shared with [place].  Approval dates: 07/16/18-07/16/19 ID: [id number] Group: [id number] BIN: [id number] PCN: [id number]  I called the patient and gave him the good news, I also explained that the Gillermina Phy would now be filled at [facility]. Patient stated he is now taking 2 capsules a day and has 56 capsules on hand right now so we are going to schedule his Gillermina Phy to ship to him on 1/27. Gregory [name] verbalized understanding and great appreciation.   Gregory [name] Phone [phone] Fax [phone]

## 2018-07-18 ENCOUNTER — Inpatient Hospital Stay (HOSPITAL_BASED_OUTPATIENT_CLINIC_OR_DEPARTMENT_OTHER): Payer: Medicare Other | Admitting: Oncology

## 2018-07-18 ENCOUNTER — Inpatient Hospital Stay: Payer: Medicare Other | Attending: Oncology

## 2018-07-18 ENCOUNTER — Telehealth: Payer: Self-pay | Admitting: Pharmacist

## 2018-07-18 ENCOUNTER — Telehealth: Payer: Self-pay | Admitting: Oncology

## 2018-07-18 VITALS — BP 161/81 | HR 103 | Temp 97.7°F | Resp 18 | Ht 72.0 in | Wt 207.8 lb

## 2018-07-18 DIAGNOSIS — C61 Malignant neoplasm of prostate: Secondary | ICD-10-CM | POA: Diagnosis not present

## 2018-07-18 DIAGNOSIS — C7951 Secondary malignant neoplasm of bone: Secondary | ICD-10-CM | POA: Diagnosis not present

## 2018-07-18 DIAGNOSIS — Z923 Personal history of irradiation: Secondary | ICD-10-CM

## 2018-07-18 DIAGNOSIS — Z79899 Other long term (current) drug therapy: Secondary | ICD-10-CM | POA: Insufficient documentation

## 2018-07-18 DIAGNOSIS — Z79818 Long term (current) use of other agents affecting estrogen receptors and estrogen levels: Secondary | ICD-10-CM | POA: Diagnosis not present

## 2018-07-18 LAB — CBC WITH DIFFERENTIAL (CANCER CENTER ONLY)
Abs Immature Granulocytes: 0.02 10*3/uL (ref 0.00–0.07)
BASOS PCT: 0 %
Basophils Absolute: 0 10*3/uL (ref 0.0–0.1)
EOS ABS: 0.1 10*3/uL (ref 0.0–0.5)
Eosinophils Relative: 2 %
HCT: 39.2 % (ref 39.0–52.0)
Hemoglobin: 12.6 g/dL — ABNORMAL LOW (ref 13.0–17.0)
Immature Granulocytes: 0 %
Lymphocytes Relative: 28 %
Lymphs Abs: 2.3 10*3/uL (ref 0.7–4.0)
MCH: 29.6 pg (ref 26.0–34.0)
MCHC: 32.1 g/dL (ref 30.0–36.0)
MCV: 92.2 fL (ref 80.0–100.0)
MONOS PCT: 7 %
Monocytes Absolute: 0.6 10*3/uL (ref 0.1–1.0)
NRBC: 0 % (ref 0.0–0.2)
Neutro Abs: 5.1 10*3/uL (ref 1.7–7.7)
Neutrophils Relative %: 63 %
PLATELETS: 326 10*3/uL (ref 150–400)
RBC: 4.25 MIL/uL (ref 4.22–5.81)
RDW: 13.4 % (ref 11.5–15.5)
WBC: 8.2 10*3/uL (ref 4.0–10.5)

## 2018-07-18 LAB — CMP (CANCER CENTER ONLY)
ALT: 13 U/L (ref 0–44)
AST: 14 U/L — AB (ref 15–41)
Albumin: 3.6 g/dL (ref 3.5–5.0)
Alkaline Phosphatase: 60 U/L (ref 38–126)
Anion gap: 11 (ref 5–15)
BUN: 12 mg/dL (ref 8–23)
CO2: 27 mmol/L (ref 22–32)
Calcium: 9.6 mg/dL (ref 8.9–10.3)
Chloride: 104 mmol/L (ref 98–111)
Creatinine: 1.1 mg/dL (ref 0.61–1.24)
Glucose, Bld: 116 mg/dL — ABNORMAL HIGH (ref 70–99)
POTASSIUM: 4.4 mmol/L (ref 3.5–5.1)
SODIUM: 142 mmol/L (ref 135–145)
Total Bilirubin: 0.4 mg/dL (ref 0.3–1.2)
Total Protein: 7.5 g/dL (ref 6.5–8.1)

## 2018-07-18 MED ORDER — ENZALUTAMIDE 40 MG PO CAPS: 80.0000 mg | ORAL_CAPSULE | Freq: Every day | ORAL | 2 refills | Status: DC

## 2018-07-18 NOTE — Telephone Encounter (Signed)
Oral Chemotherapy Pharmacist Encounter  Follow-Up Form  Spoke with patient in exam room today to follow up regarding patient's oral chemotherapy medication: Xtandi (enzalutamide) for the treatment of metastatic, castration-resistant prostate cancer, patient is status post orchiectomy, planned duration until disease progression or unacceptable toxicity  Original Start date of oral chemotherapy: 01/22/2017  Pt is doing well today  Pt reports 0 tablets/doses of Xtandi 40mg  capsules, 2 capsules (80mg ) by mouth one daily without regard to food, missed in the last month.  Dose was decreased to 80mg  daily after office visit on 01/04/2018. Patient takes his Xtandi at 12 noon each day.  Pt reports the following side effects: Major improvement in fatigue since Xtandi dose reduction.  Patient does still experience intermittent hot flashes that do not occur on a daily basis and are not that bothersome.  Pertinent labs reviewed: Okay for continued treatment, noted rising PSA, patient will remain on Xtandi at this time.  Other Issues: Patient is being transitioned from receiving his Xtandi through manufacturer assistance program and will now receive his Gillermina Phy from the [facility], copayment will be covered with a foundation grant. Prescription for Xtandi 80 mg daily has been sent to the pharmacy, written with enough refills to last until next office visit. Next fill of Gillermina Phy will ship from the [facility] pharmacy on 08/06/2018 to deliver at patient's home on 08/07/2018. Patient has quantity #56 capsules remaining at home.  Patient knows to call the office with questions or concerns. Oral Oncology Clinic will continue to follow.  Johny Drilling, PharmD, BCPS, BCOP  07/18/2018 9:34 AM Oral Oncology Clinic [phone]

## 2018-07-18 NOTE — Telephone Encounter (Signed)
Printed calendar and avs. °

## 2018-07-18 NOTE — Progress Notes (Signed)
Hematology and Oncology Follow Up Visit  Gregory [name] [id number] Mar 18, 1929 85 y.o. 07/18/2018 9:24 AM [name], FNPStroud, [name], FNP   Principle Diagnosis: 85 year old man with castration-resistant prostate cancer documented in 2017.  He was originally diagnosed with Gleason score 4+5 = 9 and a PSA of 18.7 with bone disease in 2017.  Prior Therapy: He is status post TURP procedure and orchiectomy in February 2017. He also received prophylactic radiation to the right femoral hip for a total of 30 gray in 10 fractions in the care of Dr. Tammi Klippel completed in April 2017.  He developed castration resistant disease in July 2017.  He received Xofigo for a total of 6 treatments completed in March 2018. PSA was 5.15 in 10/26/2016 with castrate level testosterone. .   Current therapy: Xtandi 160 mg daily started in 01/2017.  He is currently taking 80 mg daily since June 2019.  Interim History: [name] presents today for a follow-up visit.  Since the last visit, he reports no major complaints in his health.  He has tolerated Xtandi at 80 mg daily much better.  He reported his main issue are related to unusual dreams and sensations that have resolved at this time.  He denies any bone pain, pathological fractures or worsening performance status.  He eats well and continues to attend to activities of daily living.  He denies any recent falls or syncope.  He denied any headaches, blurry vision, syncope or seizures.  He denies any alteration in mental status or lethargy.  He does not report any fevers, chills, sweats He does not report any chest pain, palpitation, orthopnea or leg edema. He is not report any cough, wheezing or hemoptysis. He is not reporting nausea, vomiting or early satiety.  He denies any constipation or diarrhea.  He is not reporting frequency urgency, hematuria or dysuria.  He does not report any bone pain or pathological fractures.  He denies any skin ecchymosis or  petechiae.  He does not report any easy bruising or bleeding tendency.  He denies any mood changes.  Remaining review of systems is negative.   Medications: I have reviewed the patient's current medications.  Current Outpatient Medications  Medication Sig Dispense Refill  . alendronate (FOSAMAX) 70 MG tablet Take 70 mg by mouth once a week. Take with a full glass of water on an empty stomach.    . metoprolol succinate (TOPROL XL) 100 MG 24 hr tablet Take 1 tablet (100 mg total) by mouth daily. Take with or immediately following a meal. 90 tablet 3  . Multiple Vitamins-Minerals (MULTIVITAMIN WITH MINERALS) tablet Take 1 tablet by mouth every morning.     . polyethylene glycol (MIRALAX / GLYCOLAX) packet Take 17 g by mouth daily as needed for mild constipation. 14 each 0  . tamsulosin (FLOMAX) 0.4 MG CAPS capsule Take 0.4 mg by mouth daily.  11  . XTANDI 40 MG capsule TAKE 4 CAPSULES BY MOUTH DAILY 120 capsule 0   No current facility-administered medications for this visit.      Allergies: No Known Allergies  Past Medical History, Surgical history, Social history, and Family History reviewed without any changes.  Physical Exam: Blood pressure (!) 161/81, pulse (!) 103, temperature 97.7 F (36.5 C), temperature source Oral, resp. rate 18, height 6' (1.829 m), weight 207 lb 12.8 oz (94.3 kg), SpO2 100 %.   ECOG: 1   General appearance: Alert, awake without any distress. Head: Atraumatic without abnormalities Oropharynx: Without any thrush  or ulcers. Eyes: No scleral icterus. Lymph nodes: No lymphadenopathy noted in the cervical, supraclavicular, or axillary nodes Heart:regular rate and rhythm, without any murmurs or gallops.   Lung: Clear to auscultation without any rhonchi, wheezes or dullness to percussion. Abdomin: Soft, nontender without any shifting dullness or ascites. Musculoskeletal: No clubbing or cyanosis. Neurological: No motor or sensory deficits. Skin: No rashes or  lesions.     Lab Results: Lab Results  Component Value Date   WBC 8.2 07/18/2018   HGB 12.6 (L) 07/18/2018   HCT 39.2 07/18/2018   MCV 92.2 07/18/2018   PLT 326 07/18/2018     Chemistry      Component Value Date/Time   NA 147 (H) 04/06/2018 0905   NA 140 06/29/2017 1333   K 4.7 04/06/2018 0905   K 4.8 06/29/2017 1333   CL 106 04/06/2018 0905   CO2 28 04/06/2018 0905   CO2 25 06/29/2017 1333   BUN 11 04/06/2018 0905   BUN 10.2 06/29/2017 1333   CREATININE 1.21 04/06/2018 0905   CREATININE 1.1 06/29/2017 1333      Component Value Date/Time   CALCIUM 10.3 04/06/2018 0905   CALCIUM 10.1 06/29/2017 1333   ALKPHOS 55 04/06/2018 0905   ALKPHOS 57 06/29/2017 1333   AST 13 (L) 04/06/2018 0905   AST 15 06/29/2017 1333   ALT 8 04/06/2018 0905   ALT 9 06/29/2017 1333   BILITOT 0.6 04/06/2018 0905   BILITOT 0.64 06/29/2017 1333     Results for Gregory [name] (MRN [id number]) as of 07/18/2018 08:35  Ref. Range 01/01/2018 09:29 04/06/2018 09:05  Prostate Specific Ag, Serum Latest Ref Range: 0.0 - 4.0 ng/mL 7.7 (H) 13.7 (H)    Impression and Plan:  85 year old man with  1.  Advanced prostate cancer with disease to the bone that is currently castration-resistant documented in 2017.  He remains on Xtandi and has tolerated therapy well.  His PSA continues to rise but he is overall asymptomatic.  Staging work-up in June 2019 showed stable disease at that time.  Risks and benefits of continuing this therapy was discussed today.  Alternative options would be systemic chemotherapy which is unnecessary at this time.  After discussion today is agreeable to continue at 80 mg daily for better tolerance.   2. Androgen depravation: He is status post orchiectomy without any need for additional androgen deprivation.  Testosterone remains at a castrate level.  3. Bone directed therapy: He remains on calcium and vitamin supplements.  He is also on alendronate.  No bone pain or pathological  fractures.  4. Prognosis: Aggressive therapy remains warranted at this time despite his age and the fact that he has incurable disease.  Performance status is excellent at this time.  5. Follow-up: In 3 months to follow his progress..  15  minutes was spent with the patient face-to-face today.  More than 50% of time was dedicated to updating his disease status, treatment options and answering questions regarding future plan of care.Zola Button, MD 1/8/20209:23 AM

## 2018-07-19 LAB — PROSTATE-SPECIFIC AG, SERUM (LABCORP): PROSTATE SPECIFIC AG, SERUM: 30 ng/mL — AB (ref 0.0–4.0)

## 2018-08-06 MED FILL — XTANDI 40 MG CAPSULE: 40 | 30 days supply | Qty: 60 | Fill #0

## 2018-08-06 NOTE — Telephone Encounter (Signed)
Oral Oncology Patient Advocate Encounter  Confirmed with [place] that Gregory [name] was shipped on 08/06/18 with a $0 copay using PAF grant.   [place] Patient [place] Phone [phone] Fax [phone]

## 2018-09-10 MED FILL — XTANDI 40 MG CAPSULE: 40 | 30 days supply | Qty: 60 | Fill #1

## 2018-10-04 ENCOUNTER — Telehealth: Payer: Self-pay | Admitting: Pharmacist

## 2018-10-04 NOTE — Telephone Encounter (Signed)
Oral Chemotherapy Pharmacist Encounter  Follow-Up Form  Spoke with patient today to follow up regarding patient's oral chemotherapy medication: Xtandi (enzalutamide) for the treatment of metastatic, castration-resistant prostate cancer, patient is status post orchiectomy, planned duration until disease progression or unacceptable toxicity  Original Start date of oral chemotherapy: 01/22/2017  Pt is doing well today  Pt reports 0 tablets/doses of Xtandi 40mg  capsules, 2 capsules (80mg ) by mouth one daily without regard to food, missed in the last month.  Dose was decreased to 80mg  daily after office visit on 01/04/2018. Patient takes his Xtandi at 12 noon each day.  Pt reports the following side effects: Major improvement in fatigue since Xtandi dose reduction, does remain tired but is able to perform ADLs.  Patient does still experience intermittent hot flashes that do not occur on a daily basis and are not that bothersome. Patient states he has a persistent cough for quite some time, but not has a runny nose over the past few days. He denies fever. He will contact the office if his status worsens.   Pertinent labs reviewed: OK for continued treatment. Noted PSA is increasing. Next check on 10/17/2018.  Other Issues: I will coordinate with the pharmacy to ensure next refill of Gillermina Phy is delivered in time to prevent break in therapy. Next fill of Gillermina Phy will ship from the pharmacy on Tuesday 3/30 for delivery to patient's home on Wed 4/1.  Confirmed office visits for 10/17/2018.  Patient knows to call the office with questions or concerns. Oral Oncology Clinic will continue to follow.  Johny Drilling, PharmD, BCPS, BCOP 10/04/2018 9:37 AM Oral Oncology Clinic [phone]

## 2018-10-08 ENCOUNTER — Telehealth: Payer: Self-pay

## 2018-10-08 MED FILL — XTANDI 40 MG CAPSULE: 40 | 30 days supply | Qty: 60 | Fill #2

## 2018-10-08 NOTE — Telephone Encounter (Signed)
Oral Oncology Patient Advocate Encounter  [place] notified me that the PAF grant was not picking up the [place]. I checked the grant balance and it said $0. I called PAF because based on the grant amount versus how much his copays have been, he should still have money left. PAF stated that [facility] has been submitting payments to them as well. The patient does have a grant balance of $0.   I called the patient and went over this information with him. I explained that there is no additional grants open at this time but I could help him apply for manufacturer assistance. He will be coming in tomorrow to sign the application.  While we were discussing this I asked about his income, based on this information he may be eligible for LIS. I helped him apply for LIS over the phone and will give him the receipt information when he comes in tomorrow.  The manufacturer assistance information will be updated in a separate encounter.   [place] Patient [place] Phone [phone] Fax [phone] 10/08/2018   10:31 AM

## 2018-10-08 NOTE — Telephone Encounter (Signed)
Oral Oncology Patient Advocate Encounter   Was successful in securing patient an $79 grant from [facility] [place] [facility]) to provide copayment coverage for [place].  This will keep the out of pocket expense at $0.      The billing information is as follows and has been shared with [place].   Member ID: [id number] Group ID: [id number] RxBin: [id number] Dates of Eligibility: 10/08/18 through 10/07/19  I called the patient and gave him the good news, he verbalized understanding and great appreciation. I also let him know to be expecting the Xtandi sometime this week to be mailed from [facility].  [place] Patient [place] Phone [phone] Fax [phone] 10/08/2018    3:55 PM

## 2018-10-16 ENCOUNTER — Other Ambulatory Visit: Payer: Self-pay | Admitting: Oncology

## 2018-10-16 DIAGNOSIS — C61 Malignant neoplasm of prostate: Secondary | ICD-10-CM

## 2018-10-17 ENCOUNTER — Other Ambulatory Visit: Payer: Self-pay

## 2018-10-17 ENCOUNTER — Inpatient Hospital Stay: Payer: Medicare Other | Attending: Oncology

## 2018-10-17 ENCOUNTER — Inpatient Hospital Stay (HOSPITAL_BASED_OUTPATIENT_CLINIC_OR_DEPARTMENT_OTHER): Payer: Medicare Other | Admitting: Oncology

## 2018-10-17 VITALS — BP 132/81 | HR 113 | Temp 98.5°F | Resp 17 | Ht 72.0 in | Wt 215.6 lb

## 2018-10-17 DIAGNOSIS — Z79899 Other long term (current) drug therapy: Secondary | ICD-10-CM

## 2018-10-17 DIAGNOSIS — C61 Malignant neoplasm of prostate: Secondary | ICD-10-CM

## 2018-10-17 DIAGNOSIS — Z923 Personal history of irradiation: Secondary | ICD-10-CM | POA: Insufficient documentation

## 2018-10-17 DIAGNOSIS — C7951 Secondary malignant neoplasm of bone: Secondary | ICD-10-CM

## 2018-10-17 LAB — CBC WITH DIFFERENTIAL (CANCER CENTER ONLY)
Abs Immature Granulocytes: 0.01 10*3/uL (ref 0.00–0.07)
Basophils Absolute: 0 10*3/uL (ref 0.0–0.1)
Basophils Relative: 0 %
Eosinophils Absolute: 0.1 10*3/uL (ref 0.0–0.5)
Eosinophils Relative: 1 %
HCT: 39.7 % (ref 39.0–52.0)
Hemoglobin: 12.5 g/dL — ABNORMAL LOW (ref 13.0–17.0)
Immature Granulocytes: 0 %
Lymphocytes Relative: 38 %
Lymphs Abs: 3 10*3/uL (ref 0.7–4.0)
MCH: 29.1 pg (ref 26.0–34.0)
MCHC: 31.5 g/dL (ref 30.0–36.0)
MCV: 92.5 fL (ref 80.0–100.0)
Monocytes Absolute: 0.6 10*3/uL (ref 0.1–1.0)
Monocytes Relative: 7 %
Neutro Abs: 4.3 10*3/uL (ref 1.7–7.7)
Neutrophils Relative %: 54 %
Platelet Count: 198 10*3/uL (ref 150–400)
RBC: 4.29 MIL/uL (ref 4.22–5.81)
RDW: 14 % (ref 11.5–15.5)
WBC Count: 8 10*3/uL (ref 4.0–10.5)
nRBC: 0 % (ref 0.0–0.2)

## 2018-10-17 LAB — CMP (CANCER CENTER ONLY)
ALT: 10 U/L (ref 0–44)
AST: 14 U/L — ABNORMAL LOW (ref 15–41)
Albumin: 3.7 g/dL (ref 3.5–5.0)
Alkaline Phosphatase: 63 U/L (ref 38–126)
Anion gap: 11 (ref 5–15)
BUN: 12 mg/dL (ref 8–23)
CO2: 26 mmol/L (ref 22–32)
Calcium: 9.5 mg/dL (ref 8.9–10.3)
Chloride: 103 mmol/L (ref 98–111)
Creatinine: 1.26 mg/dL — ABNORMAL HIGH (ref 0.61–1.24)
GFR, Est AFR Am: 59 mL/min — ABNORMAL LOW (ref >60)
GFR, Estimated: 51 mL/min — ABNORMAL LOW (ref >60)
Glucose, Bld: 140 mg/dL — ABNORMAL HIGH (ref 70–99)
Potassium: 4.2 mmol/L (ref 3.5–5.1)
Sodium: 140 mmol/L (ref 135–145)
Total Bilirubin: 0.6 mg/dL (ref 0.3–1.2)
Total Protein: 7.3 g/dL (ref 6.5–8.1)

## 2018-10-17 NOTE — Progress Notes (Signed)
Hematology and Oncology Follow Up Visit  Gregory [name] [id number] Dec 04, 1928 85 y.o. 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  He has castration-resistant after presenting with Gleason score 4+5 = 9 and a PSA of 18.7 with bone disease in 2017.  Prior Therapy: He is status post TURP procedure and orchiectomy in February 2017. He also received prophylactic radiation to the right femoral hip for a total of 30 gray in 10 fractions in the care of Dr. Tammi Klippel completed in April 2017.  He developed castration resistant disease in July 2017.  He received Xofigo for a total of 6 treatments completed in March 2018. PSA was 5.15 in 10/26/2016 with castrate level testosterone. .   Current therapy: Xtandi 160 mg daily started in 01/2017.  He is currently taking 80 mg daily since June 2019.  Interim History: Gregory [name] returns today for a repeat evaluation.  Since the last visit, she continues to tolerate Xtandi without any major complaints.  He denies any recent fatigue or syncope.  He denies any alteration mental status or confusion.  His appetite and performance status remains unchanged.  He continues to ambulate with the help of a cane without any recent falls.  Patient denied any alteration mental status, neuropathy, confusion or dizziness.  Denies any headaches or lethargy.  Denies any night sweats, weight loss or changes in appetite.  Denied orthopnea, dyspnea on exertion or chest discomfort.  Denies shortness of breath, difficulty breathing hemoptysis or cough.  Denies any abdominal distention, nausea, early satiety or dyspepsia.  Denies any hematuria, frequency, dysuria or nocturia.  Denies any skin irritation, dryness or rash.  Denies any ecchymosis or petechiae.  Denies any lymphadenopathy or clotting.  Denies any heat or cold intolerance.  Denies any anxiety or  depression.  Remaining review of system is negative.       Medications: I have reviewed the patient's current medications.  Current Outpatient Medications  Medication Sig Dispense Refill  . alendronate (FOSAMAX) 70 MG tablet Take 70 mg by mouth once a week. Take with a full glass of water on an empty stomach.    . enzalutamide (XTANDI) 40 MG capsule Take 2 capsules (80 mg total) by mouth daily. 60 capsule 2  . metoprolol succinate (TOPROL XL) 100 MG 24 hr tablet Take 1 tablet (100 mg total) by mouth daily. Take with or immediately following a meal. 90 tablet 3  . Multiple Vitamins-Minerals (MULTIVITAMIN WITH MINERALS) tablet Take 1 tablet by mouth every morning.     . polyethylene glycol (MIRALAX / GLYCOLAX) packet Take 17 g by mouth daily as needed for mild constipation. 14 each 0  . tamsulosin (FLOMAX) 0.4 MG CAPS capsule Take 0.4 mg by mouth daily.  11   No current facility-administered medications for this visit.      Allergies: No Known Allergies  Past Medical History, Surgical history, Social history, and Family History reviewed without any changes.  Physical Exam: Blood pressure 132/81, pulse (!) 113, temperature 98.5 F (36.9 C), temperature source Oral, resp. rate 17, height 6' (1.829 m), weight 215 lb 9.6 oz (97.8 kg), SpO2 99 %.   ECOG: 1   General appearance: Comfortable appearing without any discomfort Head: Normocephalic without any trauma Oropharynx: Mucous membranes are moist and pink without any thrush or ulcers. Eyes: Pupils are equal and round reactive to light. Lymph nodes: No cervical, supraclavicular, inguinal  or axillary lymphadenopathy.   Heart:regular rate and rhythm.  S1 and S2 without leg edema. Lung: Clear without any rhonchi or wheezes.  No dullness to percussion. Abdomin: Soft, nontender, nondistended with good bowel sounds.  No hepatosplenomegaly. Musculoskeletal: No joint deformity or effusion.  Full range of motion noted. Neurological: No  deficits noted on motor, sensory and deep tendon reflex exam. Skin: No petechial rash or dryness.  Appeared moist.       Lab Results: Lab Results  Component Value Date   WBC 8.0 10/17/2018   HGB 12.5 (L) 10/17/2018   HCT 39.7 10/17/2018   MCV 92.5 10/17/2018   PLT 198 10/17/2018     Chemistry      Component Value Date/Time   NA 142 07/18/2018 0852   NA 140 06/29/2017 1333   K 4.4 07/18/2018 0852   K 4.8 06/29/2017 1333   CL 104 07/18/2018 0852   CO2 27 07/18/2018 0852   CO2 25 06/29/2017 1333   BUN 12 07/18/2018 0852   BUN 10.2 06/29/2017 1333   CREATININE 1.10 07/18/2018 0852   CREATININE 1.1 06/29/2017 1333      Component Value Date/Time   CALCIUM 9.6 07/18/2018 0852   CALCIUM 10.1 06/29/2017 1333   ALKPHOS 60 07/18/2018 0852   ALKPHOS 57 06/29/2017 1333   AST 14 (L) 07/18/2018 0852   AST 15 06/29/2017 1333   ALT 13 07/18/2018 0852   ALT 9 06/29/2017 1333   BILITOT 0.4 07/18/2018 0852   BILITOT 0.64 06/29/2017 1333      Results for Gregory [name] (MRN [id number]) as of 10/17/2018 12:18  Ref. Range 01/01/2018 09:29 04/06/2018 09:05 07/18/2018 08:52  Prostate Specific Ag, Serum Latest Ref Range: 0.0 - 4.0 ng/mL 7.7 (H) 13.7 (H) 30.0 (H)    Impression and Plan:  85 year old man with  1.  Castration-resistant prostate cancer with disease to the bone.    He continues to tolerate Xtandi at the current dose without any recent complaints or issues.  He does not have any symptoms to suggest cancer progression.  His PSA continues to rise at this time although he is asymptomatic.  Treatment options moving forward were reviewed which include continuing Xtandi, consideration for systemic chemotherapy and potentially Xofigo.  I recommended continuing Xtandi at this time and perform staging evaluation in the near future.  If his imaging studies show progression of disease different salvage therapy may be needed.  Given his advanced age I would only recommend change of  treatment if he becomes symptomatic.   2. Androgen depravation: Testosterone level remains castrate after orchiectomy.  No additional treatment is needed.  3. Bone directed therapy: He continues to receive Xgeva under the care of [facility].  4. Prognosis: Therapy remains palliative although his performance status remains adequate and aggressive therapy is warranted at least for the time being.  5. Follow-up: We will be in 3 months.  25  minutes was spent with the patient face-to-face today.  More than 50% of time was spent on reviewing his disease status, treatment options and answering questions regarding future plan of care.Zola Button, MD 4/8/202012:19 PM

## 2018-10-18 ENCOUNTER — Telehealth: Payer: Self-pay | Admitting: Oncology

## 2018-10-18 LAB — PROSTATE-SPECIFIC AG, SERUM (LABCORP): Prostate Specific Ag, Serum: 35.8 ng/mL — ABNORMAL HIGH (ref 0.0–4.0)

## 2018-10-18 NOTE — Telephone Encounter (Signed)
Scheduled appt per 4/8 los.  Patient daughter aware of appt.

## 2018-11-01 ENCOUNTER — Other Ambulatory Visit: Payer: Self-pay | Admitting: Oncology

## 2018-11-01 DIAGNOSIS — C61 Malignant neoplasm of prostate: Secondary | ICD-10-CM

## 2018-11-05 MED FILL — XTANDI 40 MG CAPSULE: 40 | 30 days supply | Qty: 60 | Fill #0

## 2018-12-06 MED FILL — XTANDI 40 MG CAPSULE: 40 | 30 days supply | Qty: 60 | Fill #1

## 2018-12-25 ENCOUNTER — Telehealth: Payer: Self-pay

## 2018-12-26 ENCOUNTER — Other Ambulatory Visit: Payer: Self-pay

## 2018-12-26 DIAGNOSIS — I10 Essential (primary) hypertension: Secondary | ICD-10-CM

## 2018-12-26 MED ORDER — METOPROLOL SUCCINATE ER 100 MG PO TB24: 100.0000 mg | ORAL_TABLET | Freq: Every day | ORAL | 0 refills | Status: DC

## 2018-12-26 NOTE — Telephone Encounter (Signed)
Medication sent to pharmacy and patient is aware

## 2018-12-27 ENCOUNTER — Ambulatory Visit: Payer: Self-pay | Admitting: Family Medicine

## 2018-12-27 ENCOUNTER — Telehealth: Payer: Self-pay

## 2018-12-27 NOTE — Telephone Encounter (Signed)
Patient is negative for the cov-19 screening and will be coming into office

## 2018-12-28 ENCOUNTER — Ambulatory Visit (INDEPENDENT_AMBULATORY_CARE_PROVIDER_SITE_OTHER): Payer: Medicare Other | Admitting: Family Medicine

## 2018-12-28 ENCOUNTER — Other Ambulatory Visit: Payer: Self-pay

## 2018-12-28 ENCOUNTER — Encounter: Payer: Self-pay | Admitting: Family Medicine

## 2018-12-28 VITALS — BP 118/72 | HR 66 | Temp 98.0°F | Ht 72.0 in | Wt 215.0 lb

## 2018-12-28 DIAGNOSIS — H9193 Unspecified hearing loss, bilateral: Secondary | ICD-10-CM

## 2018-12-28 DIAGNOSIS — C61 Malignant neoplasm of prostate: Secondary | ICD-10-CM

## 2018-12-28 DIAGNOSIS — Z09 Encounter for follow-up examination after completed treatment for conditions other than malignant neoplasm: Secondary | ICD-10-CM

## 2018-12-28 DIAGNOSIS — I10 Essential (primary) hypertension: Secondary | ICD-10-CM

## 2018-12-28 DIAGNOSIS — H539 Unspecified visual disturbance: Secondary | ICD-10-CM | POA: Diagnosis not present

## 2018-12-28 LAB — POCT URINALYSIS DIP (MANUAL ENTRY)
Bilirubin, UA: NEGATIVE
Blood, UA: NEGATIVE
Glucose, UA: NEGATIVE mg/dL
Ketones, POC UA: NEGATIVE mg/dL
Leukocytes, UA: NEGATIVE
Nitrite, UA: NEGATIVE
Protein Ur, POC: NEGATIVE mg/dL
Spec Grav, UA: 1.01 (ref 1.010–1.025)
Urobilinogen, UA: 0.2 E.U./dL
pH, UA: 6 (ref 5.0–8.0)

## 2018-12-28 NOTE — Progress Notes (Signed)
Patient [facility]   Established Patient Office Visit  Subjective:  Patient ID: Gregory [name], male    DOB: [birth year]  Age: 85 y.o. MRN: [id number]  CC:  Chief Complaint  Patient presents with  . Follow-up    chronic condition     HPI Gregory [name] is a 85 year old male who presents for follow up today.   Past Medical History:  Diagnosis Date  . Bone metastases ([place])   . BPH (benign prostatic hyperplasia)   . Death of wife 66  . Foley catheter in place    Urinary Retension  . GERD (gastroesophageal reflux disease)   . Hypertension   . Prostate cancer [facility])   urologist- dr dahlstedt/  oncologist-  dr manning    Gleason 4+5,  PSA 18.74,  w/  Visceral and Extensive Bones METS--  pallitive radiation therapy and hormone therapy    Current Status: Since his last office visit, he is doing well with no complaints. He has follow up appointment with Urology and Oncology next month for history of Prostate Cancer.  He recently relocated to [facility] from [place]. He denies visual changes, chest pain, cough, shortness of breath, heart palpitations, and falls. He continues to have visual and hearing problems, but declines further assessment at this time. He has occasional headaches and dizziness with position changes. Denies severe headaches, confusion, seizures, double vision, and blurred vision, nausea and vomiting. He recently lost his wife, and is managing his grief as good as to be expected. His anxiety is mild. He denies fevers, chills, fatigue, recent infections, weight loss, and night sweats. No reports of GI problems such as nausea, vomiting, diarrhea, and constipation. He has no reports of blood in stools, dysuria and hematuria. He denies pain today.      Past Surgical History:  Procedure Laterality Date  . MULTIPLE TOOTH EXTRACTIONS  1980's  . ORCHIECTOMY Bilateral 08/27/2015   Procedure: ORCHIECTOMY;  Surgeon: Franchot Gallo, MD;   Location: [facility];  Service: Urology;  Laterality: Bilateral;  . PROSTATE BIOPSY  1/16 /2017  . TRANSURETHRAL RESECTION OF PROSTATE N/A 08/27/2015   Procedure: TRANSURETHRAL RESECTION OF THE PROSTATE WITH GYRUS INSTRUMENTS;  Surgeon: Franchot Gallo, MD;  Location: [facility];  Service: Urology;  Laterality: N/A;    Family History  Problem Relation Age of Onset  . Tuberculosis Mother 38  . Other Father 22    Social History   Socioeconomic History  . Marital status: Divorced    Spouse name: Not on file  . Number of children: Not on file  . Years of education: Not on file  . Highest education level: Not on file  Occupational History  . Occupation: Retired  Scientific laboratory technician  . Financial resource strain: Not on file  . Food insecurity    Worry: Not on file    Inability: Not on file  . Transportation needs    Medical: Not on file    Non-medical: Not on file  Tobacco Use  . Smoking status: Former Smoker    Packs/day: 0.25    Years: 30.00    Pack years: 7.50    Types: Cigarettes    Start date: 07/11/1992  . Smokeless tobacco: Never Used  Substance and Sexual Activity  . Alcohol use: No    Alcohol/week: 0.0 standard drinks  . Drug use: No  . Sexual activity: Never  Lifestyle  . Physical activity    Days  per week: Not on file    Minutes per session: Not on file  . Stress: Not on file  Relationships  . Social Herbalist on phone: Not on file    Gets together: Not on file    Attends religious service: Not on file    Active member of club or organization: Not on file    Attends meetings of clubs or organizations: Not on file    Relationship status: Not on file  . Intimate partner violence    Fear of current or ex partner: Not on file    Emotionally abused: Not on file    Physically abused: Not on file    Forced sexual activity: Not on file  Other Topics Concern  . Not on file  Social History Narrative  . Not on file     Outpatient Medications Prior to Visit  Medication Sig Dispense Refill  . metoprolol succinate (TOPROL XL) 100 MG 24 hr tablet Take 1 tablet (100 mg total) by mouth daily. Take with or immediately following a meal. 90 tablet 0  . Multiple Vitamins-Minerals (MULTIVITAMIN WITH MINERALS) tablet Take 1 tablet by mouth every morning.     . polyethylene glycol (MIRALAX / GLYCOLAX) packet Take 17 g by mouth daily as needed for mild constipation. 14 each 0  . tamsulosin (FLOMAX) 0.4 MG CAPS capsule Take 0.4 mg by mouth daily.  11  . XTANDI 40 MG capsule TAKE 2 CAPSULES (80 MG TOTAL) BY MOUTH DAILY. 60 capsule 2  . alendronate (FOSAMAX) 70 MG tablet Take 70 mg by mouth once a week. Take with a full glass of water on an empty stomach.     No facility-administered medications prior to visit.     No Known Allergies  ROS Review of Systems  Constitutional: Negative.   HENT: Positive for hearing loss (age related. ).   Eyes: Positive for visual disturbance.  Respiratory: Negative.   Cardiovascular: Negative.   Gastrointestinal: Negative.   Endocrine: Negative.   Genitourinary: Negative.   Musculoskeletal: Negative.   Skin: Negative.   Allergic/Immunologic: Negative.   Neurological: Negative.   Hematological: Negative.   Psychiatric/Behavioral: Negative.       Objective:    Physical Exam  Constitutional: He is oriented to person, place, and time. He appears well-developed and well-nourished.  HENT:  Head: Normocephalic and atraumatic.  Eyes: Conjunctivae are normal.  Neck: Normal range of motion. Neck supple.  Cardiovascular: Normal rate, regular rhythm and normal heart sounds.  Pulmonary/Chest: Effort normal and breath sounds normal.  Abdominal: Soft. Bowel sounds are normal.  Musculoskeletal: Normal range of motion.  Neurological: He is alert and oriented to person, place, and time. He has normal reflexes.  Skin: Skin is warm and dry.  Psychiatric: He has a normal mood and affect.  His behavior is normal. Judgment and thought content normal.  Nursing note and vitals reviewed.   BP 118/72   Pulse 66   Temp 98 F (36.7 C) (Oral)   Ht 6' (1.829 m)   Wt 215 lb (97.5 kg)   SpO2 95%   BMI 29.16 kg/m  Wt Readings from Last 3 Encounters:  12/28/18 215 lb (97.5 kg)  10/17/18 215 lb 9.6 oz (97.8 kg)  07/18/18 207 lb 12.8 oz (94.3 kg)     There are no preventive care reminders to display for this patient.  There are no preventive care reminders to display for this patient.  Lab Results  Component Value Date  TSH 1.82 12/23/2016   Lab Results  Component Value Date   WBC 8.0 10/17/2018   HGB 12.5 (L) 10/17/2018   HCT 39.7 10/17/2018   MCV 92.5 10/17/2018   PLT 198 10/17/2018   Lab Results  Component Value Date   NA 140 10/17/2018   K 4.2 10/17/2018   CHLORIDE 105 06/29/2017   CO2 26 10/17/2018   GLUCOSE 140 (H) 10/17/2018   BUN 12 10/17/2018   CREATININE 1.26 (H) 10/17/2018   BILITOT 0.6 10/17/2018   ALKPHOS 63 10/17/2018   AST 14 (L) 10/17/2018   ALT 10 10/17/2018   PROT 7.3 10/17/2018   ALBUMIN 3.7 10/17/2018   CALCIUM 9.5 10/17/2018   ANIONGAP 11 10/17/2018   EGFR >60 06/29/2017   No results found for: CHOL No results found for: HDL No results found for: LDLCALC No results found for: TRIG No results found for: CHOLHDL Lab Results  Component Value Date   HGBA1C 5.7 (H) 12/23/2016      Assessment & Plan:   1. Essential hypertension The current medical regimen is effective; blood pressure is stable at 118/72 today; continue present plan and medications as prescribed. He will continue to decrease high sodium intake, excessive alcohol intake, increase potassium intake, smoking cessation, and increase physical activity of at least 30 minutes of cardio activity daily. He will continue to follow Heart Healthy or DASH diet. - POCT urinalysis dipstick  2. Prostate cancer ([place]) Stable. He will continue Xtandi injections as prescribed.   3.  Malignant neoplasm prostate ([place]) Continue to follow up with with Urology and Oncology as needed.   4. Hearing decreased, bilateral  5. Visual changes  6. Follow up He will follow up in 6 months.    No orders of the defined types were placed in this encounter.   Orders Placed This Encounter  Procedures  . POCT urinalysis dipstick    Referral Orders  No referral(s) requested today   Kathe Becton,  MSN, FNP-BC Patient [facility], [place] [phone]   Problem List Items Addressed This Visit      Cardiovascular and Mediastinum   Essential hypertension - Primary   Relevant Orders   POCT urinalysis dipstick (Completed)     Genitourinary   Malignant neoplasm prostate ([place])   Prostate cancer ([place])    Other Visit Diagnoses    Hearing decreased, bilateral       Visual changes       Follow up          No orders of the defined types were placed in this encounter.   Follow-up: Return in about 6 months (around 06/29/2019).    [name], FNP

## 2018-12-29 DIAGNOSIS — H539 Unspecified visual disturbance: Secondary | ICD-10-CM | POA: Insufficient documentation

## 2018-12-29 DIAGNOSIS — H9193 Unspecified hearing loss, bilateral: Secondary | ICD-10-CM | POA: Insufficient documentation

## 2019-01-08 MED FILL — XTANDI 40 MG CAPSULE: 40 | 30 days supply | Qty: 60 | Fill #2

## 2019-01-18 ENCOUNTER — Ambulatory Visit (HOSPITAL_COMMUNITY)
Admission: RE | Admit: 2019-01-18 | Discharge: 2019-01-18 | Disposition: A | Discharge status: Home / Self Care | Payer: Medicare Other | Source: Ambulatory Visit | Attending: Oncology | Admitting: Oncology

## 2019-01-18 ENCOUNTER — Inpatient Hospital Stay: Payer: Medicare Other | Attending: Oncology

## 2019-01-18 ENCOUNTER — Encounter (HOSPITAL_COMMUNITY): Payer: Self-pay

## 2019-01-18 ENCOUNTER — Other Ambulatory Visit: Payer: Self-pay

## 2019-01-18 DIAGNOSIS — R9721 Rising PSA following treatment for malignant neoplasm of prostate: Secondary | ICD-10-CM | POA: Diagnosis not present

## 2019-01-18 DIAGNOSIS — C7951 Secondary malignant neoplasm of bone: Secondary | ICD-10-CM | POA: Diagnosis not present

## 2019-01-18 DIAGNOSIS — C61 Malignant neoplasm of prostate: Secondary | ICD-10-CM

## 2019-01-18 DIAGNOSIS — Z79899 Other long term (current) drug therapy: Secondary | ICD-10-CM | POA: Insufficient documentation

## 2019-01-18 LAB — CBC WITH DIFFERENTIAL (CANCER CENTER ONLY)
Abs Immature Granulocytes: 0.01 10*3/uL (ref 0.00–0.07)
Basophils Absolute: 0 10*3/uL (ref 0.0–0.1)
Basophils Relative: 0 %
Eosinophils Absolute: 0.4 10*3/uL (ref 0.0–0.5)
Eosinophils Relative: 5 %
HCT: 39.3 % (ref 39.0–52.0)
Hemoglobin: 12.8 g/dL — ABNORMAL LOW (ref 13.0–17.0)
Immature Granulocytes: 0 %
Lymphocytes Relative: 36 %
Lymphs Abs: 2.6 10*3/uL (ref 0.7–4.0)
MCH: 29.9 pg (ref 26.0–34.0)
MCHC: 32.6 g/dL (ref 30.0–36.0)
MCV: 91.8 fL (ref 80.0–100.0)
Monocytes Absolute: 0.5 10*3/uL (ref 0.1–1.0)
Monocytes Relative: 8 %
Neutro Abs: 3.7 10*3/uL (ref 1.7–7.7)
Neutrophils Relative %: 51 %
Platelet Count: 210 10*3/uL (ref 150–400)
RBC: 4.28 MIL/uL (ref 4.22–5.81)
RDW: 13.8 % (ref 11.5–15.5)
WBC Count: 7.2 10*3/uL (ref 4.0–10.5)
nRBC: 0 % (ref 0.0–0.2)

## 2019-01-18 LAB — CMP (CANCER CENTER ONLY)
ALT: 9 U/L (ref 0–44)
AST: 15 U/L (ref 15–41)
Albumin: 3.7 g/dL (ref 3.5–5.0)
Alkaline Phosphatase: 72 U/L (ref 38–126)
Anion gap: 9 (ref 5–15)
BUN: 14 mg/dL (ref 8–23)
CO2: 27 mmol/L (ref 22–32)
Calcium: 9.6 mg/dL (ref 8.9–10.3)
Chloride: 105 mmol/L (ref 98–111)
Creatinine: 1.14 mg/dL (ref 0.61–1.24)
GFR, Est AFR Am: >60 mL/min (ref >60)
GFR, Estimated: 58 mL/min — ABNORMAL LOW (ref >60)
Glucose, Bld: 116 mg/dL — ABNORMAL HIGH (ref 70–99)
Potassium: 4 mmol/L (ref 3.5–5.1)
Sodium: 141 mmol/L (ref 135–145)
Total Bilirubin: 0.7 mg/dL (ref 0.3–1.2)
Total Protein: 7.4 g/dL (ref 6.5–8.1)

## 2019-01-18 MED ORDER — TECHNETIUM TC 99M MEDRONATE IV KIT
20.0000 | PACK | Freq: Once | INTRAVENOUS | Status: AC | PRN
  Administered 2019-01-18: 20 via INTRAVENOUS

## 2019-01-18 MED ORDER — IOHEXOL 300 MG/ML  SOLN
100.0000 mL | Freq: Once | INTRAMUSCULAR | Status: AC | PRN
  Administered 2019-01-18: 100 mL via INTRAVENOUS

## 2019-01-18 MED ORDER — SODIUM CHLORIDE (PF) 0.9 % IJ SOLN
INTRAMUSCULAR | Status: AC
  Filled 2019-01-18: qty 50

## 2019-01-19 LAB — PROSTATE-SPECIFIC AG, SERUM (LABCORP): Prostate Specific Ag, Serum: 64.1 ng/mL — ABNORMAL HIGH (ref 0.0–4.0)

## 2019-01-23 ENCOUNTER — Inpatient Hospital Stay (HOSPITAL_BASED_OUTPATIENT_CLINIC_OR_DEPARTMENT_OTHER): Payer: Medicare Other | Admitting: Oncology

## 2019-01-23 ENCOUNTER — Other Ambulatory Visit: Payer: Self-pay

## 2019-01-23 VITALS — BP 139/80 | HR 63 | Temp 97.6°F | Resp 17 | Ht 72.0 in | Wt 215.9 lb

## 2019-01-23 DIAGNOSIS — R9721 Rising PSA following treatment for malignant neoplasm of prostate: Secondary | ICD-10-CM

## 2019-01-23 DIAGNOSIS — Z79899 Other long term (current) drug therapy: Secondary | ICD-10-CM | POA: Diagnosis not present

## 2019-01-23 DIAGNOSIS — C7951 Secondary malignant neoplasm of bone: Secondary | ICD-10-CM

## 2019-01-23 DIAGNOSIS — C61 Malignant neoplasm of prostate: Secondary | ICD-10-CM

## 2019-01-23 NOTE — Progress Notes (Signed)
Hematology and Oncology Follow Up Visit  Gregory [name] [id number] Oct 21, 1928 85 y.o. 01/23/2019 9:38 AM Gregory [name], FNPStroud, Gregory Lunch, FNP   Principle Diagnosis: 85 year old man with castration-resistant prostate cancer with disease to the bone diagnosed in 2017.  He was found to have Gleason score 4+5 = 9 and a PSA of 18.7 at that time.  Prior Therapy: He is status post TURP procedure and orchiectomy in February 2017. He also received prophylactic radiation to the right femoral hip for a total of 30 gray in 10 fractions in the care of Dr. Tammi Klippel completed in April 2017.  He developed castration resistant disease in July 2017.  He received Xofigo for a total of 6 treatments completed in March 2018. PSA was 5.15 in 10/26/2016 with castrate level testosterone. .   Current therapy: Xtandi 160 mg daily started in 01/2017.  He is currently taking 80 mg daily since June 2019.  Interim History: Gregory [name] is here for a follow-up visit.  Since the last visit, he reports no major changes in his health.  He continues to tolerate Xtandi without any recent hospitalization or illnesses.  He denies any worsening fatigue or seizures.  He denies any pain or discomfort.  He continues to be independent and ambulating without any major difficulties.  His son lives with him but for the most part it does activities of daily living without any issues.  He denied headaches, blurry vision, syncope or seizures.  Denies any fevers, chills or sweats.  Denied chest pain, palpitation, orthopnea or leg edema.  Denied cough, wheezing or hemoptysis.  Denied nausea, vomiting or abdominal pain.  Denies any constipation or diarrhea.  Denies any frequency urgency or hesitancy.  Denies any arthralgias or myalgias.  Denies any skin rashes or lesions.  Denies any bleeding or clotting tendency.  Denies any easy bruising.  Denies any hair or nail changes.  Denies any anxiety or depression.  Remaining review of system is  negative.        Medications: Medications are reviewed without anything today. Current Outpatient Medications  Medication Sig Dispense Refill  . metoprolol succinate (TOPROL XL) 100 MG 24 hr tablet Take 1 tablet (100 mg total) by mouth daily. Take with or immediately following a meal. 90 tablet 0  . Multiple Vitamins-Minerals (MULTIVITAMIN WITH MINERALS) tablet Take 1 tablet by mouth every morning.     . polyethylene glycol (MIRALAX / GLYCOLAX) packet Take 17 g by mouth daily as needed for mild constipation. 14 each 0  . tamsulosin (FLOMAX) 0.4 MG CAPS capsule Take 0.4 mg by mouth daily.  11  . XTANDI 40 MG capsule TAKE 2 CAPSULES (80 MG TOTAL) BY MOUTH DAILY. 60 capsule 2   No current facility-administered medications for this visit.      Allergies: No Known Allergies  Past Medical History, Surgical history, Social history, and Family History updated without any changes at this time  Physical Exam: Blood pressure 139/80, pulse 63, temperature 97.6 F (36.4 C), temperature source Oral, resp. rate 17, height 6' (1.829 m), weight 215 lb 14.4 oz (97.9 kg), SpO2 100 %.   ECOG: 1    Gregory [name] appearance: Alert, awake without any distress. Head: Atraumatic without abnormalities Oropharynx: Without any thrush or ulcers. Eyes: No scleral icterus. Lymph nodes: No lymphadenopathy noted in the cervical, supraclavicular, or axillary nodes Heart:regular rate and rhythm, without any murmurs or gallops.   Lung: Clear to auscultation without any rhonchi, wheezes or dullness to percussion. Abdomin: Soft, nontender  without any shifting dullness or ascites. Musculoskeletal: No clubbing or cyanosis. Neurological: No motor or sensory deficits. Skin: No rashes or lesions.      Lab Results: Lab Results  Component Value Date   WBC 7.2 01/18/2019   HGB 12.8 (L) 01/18/2019   HCT 39.3 01/18/2019   MCV 91.8 01/18/2019   PLT 210 01/18/2019     Chemistry      Component Value Date/Time    NA 141 01/18/2019 0834   NA 140 06/29/2017 1333   K 4.0 01/18/2019 0834   K 4.8 06/29/2017 1333   CL 105 01/18/2019 0834   CO2 27 01/18/2019 0834   CO2 25 06/29/2017 1333   BUN 14 01/18/2019 0834   BUN 10.2 06/29/2017 1333   CREATININE 1.14 01/18/2019 0834   CREATININE 1.1 06/29/2017 1333      Component Value Date/Time   CALCIUM 9.6 01/18/2019 0834   CALCIUM 10.1 06/29/2017 1333   ALKPHOS 72 01/18/2019 0834   ALKPHOS 57 06/29/2017 1333   AST 15 01/18/2019 0834   AST 15 06/29/2017 1333   ALT 9 01/18/2019 0834   ALT 9 06/29/2017 1333   BILITOT 0.7 01/18/2019 0834   BILITOT 0.64 06/29/2017 1333      Results for Gregory [name], Gregory [name] (MRN [id number]) as of 01/23/2019 09:30  Ref. Range 10/17/2018 11:50 01/18/2019 08:34  Prostate Specific Ag, Serum Latest Ref Range: 0.0 - 4.0 ng/mL 35.8 (H) 64.1 (H)   FINDINGS: New foci of abnormal tracer accumulation are identified at the LEFT supra-acetabular region, at the RIGHT sacrum, and inferior RIGHT iliac bone adjacent to the SI joint consistent with osseous metastases.  New focus of uptake at either inferior RIGHT scapula or posterolateral RIGHT fourth rib.  Again identified uptake in the thoracic spine at T4 unchanged.  Subtle uptake at a rib lesion, lateral RIGHT sixth rib, new.  Chronic uptake at proximal RIGHT femur.  Subtle uptake at proximal LEFT humerus at the level of the surgical neck.  Expected urinary tract and soft tissue distribution of tracer.  IMPRESSION: Progressive osseous metastatic disease.  Impression and Plan:  85 year old man with  1.  Advanced prostate cancer with disease to the bone that is currently castration-resistant.   He is currently on Xtandi with a rising PSA indicating progression of disease.  CT scan and bone scan obtained on 01/18/2019 were personally reviewed and discussed with the patient today.  He does not have any visceral metastasis at this time although he is experiencing worsening  bony disease based on bone scan but not CT scan.  Treatment options were reviewed today which include continuing Xtandi, switching to [place], systemic chemotherapy or Xofigo.  At this time, the fact that he is completely asymptomatic and his disease progression is minimal I recommended continuing Xtandi although switching to [place] if he develops bone disease would be the next option.  Systemic chemotherapy would be an option as well although he is a marginal candidate for it.  He benefits from [place] is limited after exposure to [place].  2. Androgen depravation: He status post orchiectomy without any additional imaging deprivation needed.  3. Bone directed therapy: I recommended continuing Xgeva which she is currently receiving under the care of Dr. Diona Fanti.  4. Prognosis: His disease is incurable although his performance status is adequate and aggressive measures are warranted.  5. Follow-up: In 3 months for repeat evaluation and assessing the need for different salvage therapy.  25  minutes was spent with the patient face-to-face today.  More than 50% of time was dedicated to reviewing his disease status, reviewing imaging studies, treatment options and addressing questions regarding future plan of care.    Zola Button, MD 7/15/20209:38 AM

## 2019-01-25 ENCOUNTER — Telehealth: Payer: Self-pay | Admitting: Oncology

## 2019-01-25 NOTE — Telephone Encounter (Signed)
Called and left msg for patient. Mailed printout  °

## 2019-02-04 ENCOUNTER — Other Ambulatory Visit: Payer: Self-pay | Admitting: Oncology

## 2019-02-04 DIAGNOSIS — C61 Malignant neoplasm of prostate: Secondary | ICD-10-CM

## 2019-02-07 MED FILL — XTANDI 40 MG CAPSULE: 40 | 30 days supply | Qty: 60 | Fill #0

## 2019-03-11 MED FILL — XTANDI 40 MG CAPSULE: 40 | 30 days supply | Qty: 60 | Fill #1

## 2019-03-19 ENCOUNTER — Other Ambulatory Visit: Payer: Self-pay | Admitting: Family Medicine

## 2019-03-19 DIAGNOSIS — I10 Essential (primary) hypertension: Secondary | ICD-10-CM

## 2019-04-04 MED FILL — XTANDI 40 MG CAPSULE: 40 | 30 days supply | Qty: 60 | Fill #2

## 2019-04-25 ENCOUNTER — Inpatient Hospital Stay: Payer: Medicare Other | Attending: Oncology | Admitting: Oncology

## 2019-04-25 ENCOUNTER — Inpatient Hospital Stay: Payer: Medicare Other

## 2019-04-25 ENCOUNTER — Other Ambulatory Visit: Payer: Self-pay

## 2019-04-25 VITALS — BP 152/84 | HR 105 | Temp 97.9°F | Resp 18 | Ht 72.0 in | Wt 215.7 lb

## 2019-04-25 DIAGNOSIS — R972 Elevated prostate specific antigen [PSA]: Secondary | ICD-10-CM | POA: Insufficient documentation

## 2019-04-25 DIAGNOSIS — C61 Malignant neoplasm of prostate: Secondary | ICD-10-CM

## 2019-04-25 DIAGNOSIS — E291 Testicular hypofunction: Secondary | ICD-10-CM | POA: Diagnosis not present

## 2019-04-25 DIAGNOSIS — Z9079 Acquired absence of other genital organ(s): Secondary | ICD-10-CM | POA: Insufficient documentation

## 2019-04-25 DIAGNOSIS — C7951 Secondary malignant neoplasm of bone: Secondary | ICD-10-CM | POA: Diagnosis not present

## 2019-04-25 LAB — CBC WITH DIFFERENTIAL (CANCER CENTER ONLY)
Abs Immature Granulocytes: 0.02 10*3/uL (ref 0.00–0.07)
Basophils Absolute: 0 10*3/uL (ref 0.0–0.1)
Basophils Relative: 0 %
Eosinophils Absolute: 0.3 10*3/uL (ref 0.0–0.5)
Eosinophils Relative: 3 %
HCT: 38.5 % — ABNORMAL LOW (ref 39.0–52.0)
Hemoglobin: 12.5 g/dL — ABNORMAL LOW (ref 13.0–17.0)
Immature Granulocytes: 0 %
Lymphocytes Relative: 31 %
Lymphs Abs: 2.4 10*3/uL (ref 0.7–4.0)
MCH: 29.6 pg (ref 26.0–34.0)
MCHC: 32.5 g/dL (ref 30.0–36.0)
MCV: 91.2 fL (ref 80.0–100.0)
Monocytes Absolute: 0.5 10*3/uL (ref 0.1–1.0)
Monocytes Relative: 6 %
Neutro Abs: 4.5 10*3/uL (ref 1.7–7.7)
Neutrophils Relative %: 60 %
Platelet Count: 225 10*3/uL (ref 150–400)
RBC: 4.22 MIL/uL (ref 4.22–5.81)
RDW: 14.1 % (ref 11.5–15.5)
WBC Count: 7.6 10*3/uL (ref 4.0–10.5)
nRBC: 0 % (ref 0.0–0.2)

## 2019-04-25 LAB — CMP (CANCER CENTER ONLY)
ALT: 7 U/L (ref 0–44)
AST: 13 U/L — ABNORMAL LOW (ref 15–41)
Albumin: 3.7 g/dL (ref 3.5–5.0)
Alkaline Phosphatase: 74 U/L (ref 38–126)
Anion gap: 12 (ref 5–15)
BUN: 11 mg/dL (ref 8–23)
CO2: 26 mmol/L (ref 22–32)
Calcium: 9.5 mg/dL (ref 8.9–10.3)
Chloride: 103 mmol/L (ref 98–111)
Creatinine: 1.15 mg/dL (ref 0.61–1.24)
GFR, Est AFR Am: >60 mL/min (ref >60)
GFR, Estimated: 57 mL/min — ABNORMAL LOW (ref >60)
Glucose, Bld: 132 mg/dL — ABNORMAL HIGH (ref 70–99)
Potassium: 3.8 mmol/L (ref 3.5–5.1)
Sodium: 141 mmol/L (ref 135–145)
Total Bilirubin: 0.4 mg/dL (ref 0.3–1.2)
Total Protein: 7.6 g/dL (ref 6.5–8.1)

## 2019-04-25 NOTE — Progress Notes (Signed)
Hematology and Oncology Follow Up Visit  Gregory [name] [id number] [birth year] 85 y.o. 04/25/2019 9:29 AM Gregory [name], FNPStroud, Gregory Lunch, FNP   Principle Diagnosis: 85 year old man with advanced prostate cancer with disease to the bone diagnosed in 2017.  He has castration-resistant after presenting with Gleason score 4+5 = 9 and a PSA of 18.7.  He did have Xofigo  Prior Therapy: He is status post TURP procedure and orchiectomy in February 2017. He also received prophylactic radiation to the right femoral hip for a total of 30 gray in 10 fractions in the care of Dr. Tammi [name] completed in April 2017.  He developed castration resistant disease in July 2017.  He received Xofigo for a total of 6 treatments completed in March 2018. PSA was 5.15 in 10/26/2016 with castrate level testosterone. .   Current therapy: Xtandi 160 mg daily started in 01/2017.  He is currently taking 80 mg daily since June 2019.  Interim History: Gregory [name] returns for repeat evaluation.  Since last visit, he reports no major changes in his health.  He denies any recent hospitalization or illnesses.  He denies any recent falls or syncope.  Denies any increased pain or discomfort.  Denies any back pain, hip pain or pathological fractures.  Formal status and quality of life remains unchanged.  He continues to live independently and attends activities of daily living without any decline.  Patient denied any alteration mental status, neuropathy, confusion or dizziness.  Denies any headaches or lethargy.  Denies any night sweats, weight loss or changes in appetite.  Denied orthopnea, dyspnea on exertion or chest discomfort.  Denies shortness of breath, difficulty breathing hemoptysis or cough.  Denies any abdominal distention, nausea, early satiety or dyspepsia.  Denies any hematuria, frequency, dysuria or nocturia.  Denies any skin irritation, dryness or rash.  Denies any ecchymosis or petechiae.  Denies any lymphadenopathy  or clotting.  Denies any heat or cold intolerance.  Denies any anxiety or depression.  Remaining review of system is negative.           Medications: Reviewed without any changes. Current Outpatient Medications  Medication Sig Dispense Refill  . metoprolol succinate (TOPROL-XL) 100 MG 24 hr tablet TAKE 1 TABLET (100 MG TOTAL) BY MOUTH DAILY. TAKE WITH OR IMMEDIATELY FOLLOWING A MEAL. 90 tablet 0  . Multiple Vitamins-Minerals (MULTIVITAMIN WITH MINERALS) tablet Take 1 tablet by mouth every morning.     . polyethylene glycol (MIRALAX / GLYCOLAX) packet Take 17 g by mouth daily as needed for mild constipation. 14 each 0  . tamsulosin (FLOMAX) 0.4 MG CAPS capsule Take 0.4 mg by mouth daily.  11  . XTANDI 40 MG capsule TAKE 2 CAPSULES (80 MG TOTAL) BY MOUTH DAILY. 60 capsule 2   No current facility-administered medications for this visit.      Allergies: No Known Allergies  Past Medical History, Surgical history, Social history, and Family History without any changes on review.  Physical Exam: Blood pressure (!) 152/84, pulse (!) 105, temperature 97.9 F (36.6 C), temperature source Oral, resp. rate 18, height 6' (1.829 m), weight 215 lb 11.2 oz (97.8 kg), SpO2 100 %.   ECOG: 1    General appearance: Comfortable appearing without any discomfort Head: Normocephalic without any trauma Oropharynx: Mucous membranes are moist and pink without any thrush or ulcers. Eyes: Pupils are equal and round reactive to light. Lymph nodes: No cervical, supraclavicular, inguinal or axillary lymphadenopathy.   Heart:regular rate and rhythm.  S1 and S2  without leg edema. Lung: Clear without any rhonchi or wheezes.  No dullness to percussion. Abdomin: Soft, nontender, nondistended with good bowel sounds.  No hepatosplenomegaly. Musculoskeletal: No joint deformity or effusion.  Full range of motion noted. Neurological: No deficits noted on motor, sensory and deep tendon reflex exam. Skin: No  petechial rash or dryness.  Appeared moist.       Lab Results: Lab Results  Component Value Date   WBC 7.6 04/25/2019   HGB 12.5 (L) 04/25/2019   HCT 38.5 (L) 04/25/2019   MCV 91.2 04/25/2019   PLT 225 04/25/2019     Chemistry      Component Value Date/Time   NA 141 01/18/2019 0834   NA 140 06/29/2017 1333   K 4.0 01/18/2019 0834   K 4.8 06/29/2017 1333   CL 105 01/18/2019 0834   CO2 27 01/18/2019 0834   CO2 25 06/29/2017 1333   BUN 14 01/18/2019 0834   BUN 10.2 06/29/2017 1333   CREATININE 1.14 01/18/2019 0834   CREATININE 1.1 06/29/2017 1333      Component Value Date/Time   CALCIUM 9.6 01/18/2019 0834   CALCIUM 10.1 06/29/2017 1333   ALKPHOS 72 01/18/2019 0834   ALKPHOS 57 06/29/2017 1333   AST 15 01/18/2019 0834   AST 15 06/29/2017 1333   ALT 9 01/18/2019 0834   ALT 9 06/29/2017 1333   BILITOT 0.7 01/18/2019 0834   BILITOT 0.64 06/29/2017 1333       Results for Gregory [name], Gregory [name] (MRN [id number]) as of 04/25/2019 09:20  Ref. Range 10/17/2018 11:50 01/18/2019 08:34  Prostate Specific Ag, Serum Latest Ref Range: 0.0 - 4.0 ng/mL 35.8 (H) 64.1 (H)     Impression and Plan:  85 year old man with  1.  Castration-resistant prostate cancer with disease to the bone diagnosed in 2017.    He has tolerated Xtandi without any major complications although he is developing progression of disease with a rise in his PSA and worsening bony metastasis.  The natural course of this disease and treatment options were reiterated again.  He has been exposed to [place] in the past and switching to [place] might have minimal response given exposure to [place].  Options of therapy at this time would be to switch systemic chemotherapy or using a PARP inhibitor if he harbors the appropriate mutation.  Given the fact that he is completely asymptomatic and has a reasonable quality of life, he would like to defer the option of chemotherapy.  I will obtain next generation sequencing from a blood  sample and investigate whether he harbors an actionable mutation specifically for PARP inhibitors.  2. Androgen depravation: No additional androgen deprivation is needed.  He is status post orchiectomy.  3. Bone directed therapy: He is currently following with Dr. Diona Fanti regarding this issue and receiving Xgeva.  4. Prognosis: Therapy remains palliative at this time and his quality of life remains reasonable.  Performance status is adequate and aggressive measures are warranted at this time.  5. Follow-up: We will continue to follow his clinical status every 3 months and sooner if needed.  25  minutes was spent with the patient face-to-face today.  More than 50% of time was spent on updating his disease status, reviewing laboratory data, treatment options review and answering questions regarding future plan of care.    Zola Button, MD 10/15/20209:29 AM

## 2019-04-26 LAB — PROSTATE-SPECIFIC AG, SERUM (LABCORP): Prostate Specific Ag, Serum: 203 ng/mL — ABNORMAL HIGH (ref 0.0–4.0)

## 2019-04-29 ENCOUNTER — Other Ambulatory Visit: Payer: Self-pay | Admitting: Oncology

## 2019-04-29 DIAGNOSIS — C61 Malignant neoplasm of prostate: Secondary | ICD-10-CM

## 2019-05-07 MED FILL — XTANDI 40 MG CAPSULE: 40 | 30 days supply | Qty: 60 | Fill #0

## 2019-05-23 ENCOUNTER — Other Ambulatory Visit: Payer: Self-pay

## 2019-05-23 ENCOUNTER — Encounter: Payer: Self-pay | Admitting: Oncology

## 2019-05-23 ENCOUNTER — Inpatient Hospital Stay: Payer: Medicare Other | Attending: Oncology

## 2019-05-23 DIAGNOSIS — C61 Malignant neoplasm of prostate: Secondary | ICD-10-CM | POA: Diagnosis present

## 2019-05-23 LAB — CMP (CANCER CENTER ONLY)
ALT: 8 U/L (ref 0–44)
AST: 15 U/L (ref 15–41)
Albumin: 3.8 g/dL (ref 3.5–5.0)
Alkaline Phosphatase: 79 U/L (ref 38–126)
Anion gap: 10 (ref 5–15)
BUN: 17 mg/dL (ref 8–23)
CO2: 29 mmol/L (ref 22–32)
Calcium: 10.2 mg/dL (ref 8.9–10.3)
Chloride: 104 mmol/L (ref 98–111)
Creatinine: 1.09 mg/dL (ref 0.61–1.24)
GFR, Est AFR Am: >60 mL/min (ref >60)
GFR, Estimated: >60 mL/min (ref >60)
Glucose, Bld: 103 mg/dL — ABNORMAL HIGH (ref 70–99)
Potassium: 4.9 mmol/L (ref 3.5–5.1)
Sodium: 143 mmol/L (ref 135–145)
Total Bilirubin: 0.5 mg/dL (ref 0.3–1.2)
Total Protein: 7.7 g/dL (ref 6.5–8.1)

## 2019-05-23 LAB — CBC WITH DIFFERENTIAL (CANCER CENTER ONLY)
Abs Immature Granulocytes: 0.04 10*3/uL (ref 0.00–0.07)
Basophils Absolute: 0 10*3/uL (ref 0.0–0.1)
Basophils Relative: 0 %
Eosinophils Absolute: 0.2 10*3/uL (ref 0.0–0.5)
Eosinophils Relative: 2 %
HCT: 40.1 % (ref 39.0–52.0)
Hemoglobin: 12.8 g/dL — ABNORMAL LOW (ref 13.0–17.0)
Immature Granulocytes: 1 %
Lymphocytes Relative: 35 %
Lymphs Abs: 2.9 10*3/uL (ref 0.7–4.0)
MCH: 29.7 pg (ref 26.0–34.0)
MCHC: 31.9 g/dL (ref 30.0–36.0)
MCV: 93 fL (ref 80.0–100.0)
Monocytes Absolute: 0.6 10*3/uL (ref 0.1–1.0)
Monocytes Relative: 7 %
Neutro Abs: 4.4 10*3/uL (ref 1.7–7.7)
Neutrophils Relative %: 55 %
Platelet Count: 227 10*3/uL (ref 150–400)
RBC: 4.31 MIL/uL (ref 4.22–5.81)
RDW: 13.9 % (ref 11.5–15.5)
WBC Count: 8.1 10*3/uL (ref 4.0–10.5)
nRBC: 0 % (ref 0.0–0.2)

## 2019-05-24 LAB — PROSTATE-SPECIFIC AG, SERUM (LABCORP): Prostate Specific Ag, Serum: 310 ng/mL — ABNORMAL HIGH (ref 0.0–4.0)

## 2019-06-10 MED FILL — XTANDI 40 MG CAPSULE: 40 | 30 days supply | Qty: 60 | Fill #1

## 2019-06-27 LAB — GUARDANT 360

## 2019-06-28 ENCOUNTER — Other Ambulatory Visit: Payer: Self-pay | Admitting: Family Medicine

## 2019-06-28 DIAGNOSIS — I10 Essential (primary) hypertension: Secondary | ICD-10-CM

## 2019-07-01 ENCOUNTER — Ambulatory Visit (INDEPENDENT_AMBULATORY_CARE_PROVIDER_SITE_OTHER): Payer: Medicare Other | Admitting: Family Medicine

## 2019-07-01 ENCOUNTER — Encounter: Payer: Self-pay | Admitting: Family Medicine

## 2019-07-01 ENCOUNTER — Other Ambulatory Visit: Payer: Self-pay

## 2019-07-01 VITALS — BP 142/72 | HR 98 | Temp 97.9°F | Ht 72.0 in | Wt 211.0 lb

## 2019-07-01 DIAGNOSIS — E559 Vitamin D deficiency, unspecified: Secondary | ICD-10-CM

## 2019-07-01 DIAGNOSIS — Z131 Encounter for screening for diabetes mellitus: Secondary | ICD-10-CM

## 2019-07-01 DIAGNOSIS — H9193 Unspecified hearing loss, bilateral: Secondary | ICD-10-CM | POA: Diagnosis not present

## 2019-07-01 DIAGNOSIS — Z23 Encounter for immunization: Secondary | ICD-10-CM

## 2019-07-01 DIAGNOSIS — C61 Malignant neoplasm of prostate: Secondary | ICD-10-CM

## 2019-07-01 DIAGNOSIS — H539 Unspecified visual disturbance: Secondary | ICD-10-CM

## 2019-07-01 DIAGNOSIS — Z09 Encounter for follow-up examination after completed treatment for conditions other than malignant neoplasm: Secondary | ICD-10-CM

## 2019-07-01 DIAGNOSIS — R739 Hyperglycemia, unspecified: Secondary | ICD-10-CM | POA: Diagnosis not present

## 2019-07-01 DIAGNOSIS — I10 Essential (primary) hypertension: Secondary | ICD-10-CM

## 2019-07-01 DIAGNOSIS — E538 Deficiency of other specified B group vitamins: Secondary | ICD-10-CM

## 2019-07-01 LAB — POCT URINALYSIS DIPSTICK
Bilirubin, UA: NEGATIVE
Blood, UA: NEGATIVE
Glucose, UA: NEGATIVE
Ketones, UA: NEGATIVE
Leukocytes, UA: NEGATIVE
Nitrite, UA: NEGATIVE
Protein, UA: NEGATIVE
Spec Grav, UA: <=1.005 — AB (ref 1.010–1.025)
Urobilinogen, UA: 0.2 E.U./dL
pH, UA: 6 (ref 5.0–8.0)

## 2019-07-01 LAB — POCT GLYCOSYLATED HEMOGLOBIN (HGB A1C): Hemoglobin A1C: 5.8 % — AB (ref 4.0–5.6)

## 2019-07-01 LAB — GLUCOSE, POCT (MANUAL RESULT ENTRY): POC Glucose: 111 mg/dl — AB (ref 70–99)

## 2019-07-01 NOTE — Progress Notes (Signed)
Patient [facility]   Established Patient Office Visit  Subjective:  Patient ID: Gregory [name], male    DOB: May 13, 1929  Age: 85 y.o. MRN: [id number]  CC:  Chief Complaint  Patient presents with  . Follow-up    HTN    HPI Gregory [name] is a 85 year old who presents for Follow Up today.   Past Medical History:  Diagnosis Date  . Bone metastases ([place])   . BPH (benign prostatic hyperplasia)   . Death of wife 32  . Foley catheter in place    Urinary Retension  . GERD (gastroesophageal reflux disease)   . Hypertension   . Prostate cancer [facility])   urologist- dr dahlstedt/  oncologist-  dr manning    Gleason 4+5,  PSA 18.74,  w/  Visceral and Extensive Bones METS--  pallitive radiation therapy and hormone therapy   Current Status: Since his last office visit, he is doing well with no complaints. He denies visual changes, chest pain, cough, shortness of breath, heart palpitations, and falls. He has occasional headaches and dizziness with position changes. Denies severe headaches, confusion, seizures, double vision, and blurred vision, nausea and vomiting. He continues to follow up with Urology as needed for history of Prostate Cancer. He continues to get Xtandi injection monthly. He denies fevers, chills, recent infections, weight loss, and night sweats. No reports of GI problems such as nausea, vomiting, diarrhea, and constipation. He has no reports of blood in stools, dysuria and hematuria. No depression or anxiety reported today. He denies suicidal ideations, homicidal ideations, or auditory hallucinations. He denies pain today.   Past Surgical History:  Procedure Laterality Date  . MULTIPLE TOOTH EXTRACTIONS  1980's  . ORCHIECTOMY Bilateral 08/27/2015   Procedure: ORCHIECTOMY;  Surgeon: Franchot Gallo, MD;  Location: [facility];  Service: Urology;  Laterality: Bilateral;  . PROSTATE BIOPSY  1/16 /2017  .  TRANSURETHRAL RESECTION OF PROSTATE N/A 08/27/2015   Procedure: TRANSURETHRAL RESECTION OF THE PROSTATE WITH GYRUS INSTRUMENTS;  Surgeon: Franchot Gallo, MD;  [facility];  Service: Urology;  Laterality: N/A;    Family History  Problem Relation Age of Onset  . Tuberculosis Mother 12  . Other Father 21    Social History   Socioeconomic History  . Marital status: Divorced    Spouse name: Not on file  . Number of children: Not on file  . Years of education: Not on file  . Highest education level: Not on file  Occupational History  . Occupation: Retired  Tobacco Use  . Smoking status: Former Smoker    Packs/day: 0.25    Years: 30.00    Pack years: 7.50    Types: Cigarettes    Start date: 07/11/1992  . Smokeless tobacco: Never Used  Substance and Sexual Activity  . Alcohol use: No    Alcohol/week: 0.0 standard drinks  . Drug use: No  . Sexual activity: Never  Other Topics Concern  . Not on file  Social History Narrative  . Not on file   Social Determinants of Health   Financial Resource Strain:   . Difficulty of Paying Living Expenses: Not on file  Food Insecurity:   . Worried About Charity fundraiser in the Last Year: Not on file  . Ran Out of Food in the Last Year: Not on file  Transportation Needs:   . Lack of Transportation (Medical): Not on file  . Lack of  Transportation (Non-Medical): Not on file  Physical Activity:   . Days of Exercise per Week: Not on file  . Minutes of Exercise per Session: Not on file  Stress:   . Feeling of Stress : Not on file  Social Connections:   . Frequency of Communication with Friends and Family: Not on file  . Frequency of Social Gatherings with Friends and Family: Not on file  . Attends Religious Services: Not on file  . Active Member of Clubs or Organizations: Not on file  . Attends Archivist Meetings: Not on file  . Marital Status: Not on file  Intimate Partner Violence:   . Fear of  Current or Ex-Partner: Not on file  . Emotionally Abused: Not on file  . Physically Abused: Not on file  . Sexually Abused: Not on file    Outpatient Medications Prior to Visit  Medication Sig Dispense Refill  . metoprolol succinate (TOPROL-XL) 100 MG 24 hr tablet TAKE 1 TABLET (100 MG TOTAL) BY MOUTH DAILY. TAKE WITH OR IMMEDIATELY FOLLOWING A MEAL. 90 tablet 0  . Multiple Vitamins-Minerals (MULTIVITAMIN WITH MINERALS) tablet Take 1 tablet by mouth every morning.     . polyethylene glycol (MIRALAX / GLYCOLAX) packet Take 17 g by mouth daily as needed for mild constipation. 14 each 0  . tamsulosin (FLOMAX) 0.4 MG CAPS capsule Take 0.4 mg by mouth daily.  11  . XTANDI 40 MG capsule TAKE 2 CAPSULES (80 MG TOTAL) BY MOUTH DAILY. 60 capsule 2   No facility-administered medications prior to visit.    No Known Allergies  ROS Review of Systems  Constitutional: Negative.   HENT: Negative.   Eyes: Negative.   Respiratory: Negative.   Cardiovascular: Negative.   Gastrointestinal: Negative.   Endocrine: Negative.   Genitourinary: Negative.        History of Prostate Cancer  Musculoskeletal: Positive for arthralgias (generalized. ).  Skin: Negative.   Allergic/Immunologic: Negative.   Neurological: Positive for dizziness (occasional ) and headaches (occasional ).  Hematological: Negative.   Psychiatric/Behavioral: Negative.    Objective:    Physical Exam  Constitutional: He is oriented to person, place, and time. He appears well-developed and well-nourished.  Cane.   HENT:  Head: Normocephalic and atraumatic.  Eyes: Conjunctivae are normal.  Cardiovascular: Normal rate, regular rhythm, normal heart sounds and intact distal pulses.  Pulmonary/Chest: Effort normal and breath sounds normal.  Abdominal: Soft. Bowel sounds are normal.  Musculoskeletal:        General: Normal range of motion.     Cervical back: Normal range of motion and neck supple.  Neurological: He is alert and  oriented to person, place, and time. He has normal reflexes.  Skin: Skin is warm and dry.  Psychiatric: He has a normal mood and affect. His behavior is normal. Judgment and thought content normal.  Nursing note and vitals reviewed.   BP (!) 142/72   Pulse 98   Temp 97.9 F (36.6 C) (Oral)   Ht 6' (1.829 m)   Wt 211 lb (95.7 kg)   SpO2 95%   BMI 28.62 kg/m  Wt Readings from Last 3 Encounters:  07/01/19 211 lb (95.7 kg)  04/25/19 215 lb 11.2 oz (97.8 kg)  01/23/19 215 lb 14.4 oz (97.9 kg)     Health Maintenance Due  Topic Date Due  . PNA vac Low Risk Adult (2 of 2 - PPSV23) 06/28/2019    There are no preventive care reminders to display for this  patient.  Lab Results  Component Value Date   TSH 1.82 12/23/2016   Lab Results  Component Value Date   WBC 8.1 05/23/2019   HGB 12.8 (L) 05/23/2019   HCT 40.1 05/23/2019   MCV 93.0 05/23/2019   PLT 227 05/23/2019   Lab Results  Component Value Date   NA 143 05/23/2019   K 4.9 05/23/2019   CHLORIDE 105 06/29/2017   CO2 29 05/23/2019   GLUCOSE 103 (H) 05/23/2019   BUN 17 05/23/2019   CREATININE 1.09 05/23/2019   BILITOT 0.5 05/23/2019   ALKPHOS 79 05/23/2019   AST 15 05/23/2019   ALT 8 05/23/2019   PROT 7.7 05/23/2019   ALBUMIN 3.8 05/23/2019   CALCIUM 10.2 05/23/2019   ANIONGAP 10 05/23/2019   EGFR >60 06/29/2017   No results found for: CHOL No results found for: HDL No results found for: LDLCALC No results found for: TRIG No results found for: [facility] Lab Results  Component Value Date   HGBA1C 5.8 (A) 07/01/2019      Assessment & Plan:   1. Malignant neoplasm prostate ([place])  2. Prostate cancer [facility]) He will continue to follow up with Urology as needed.  3. Essential hypertension The current medical regimen is effective; blood pressure is stable at 142/72 today; continue present plan and medications as prescribed. He will continue to take medications as prescribed, to decrease high sodium intake,  excessive alcohol intake, increase potassium intake, smoking cessation, and increase physical activity of at least 30 minutes of cardio activity daily. He will continue to follow Heart Healthy or DASH diet. - Urinalysis Dipstick  4. Hyperglycemia Blood glucose level at 111. And Hgb A1c at 5.8.  She will continue medication as prescribed, to decrease foods/beverages high in sugars and carbs and follow Heart Healthy or DASH diet. Increase physical activity to at least 30 minutes cardio exercise daily.  - POCT HgB A1C - Glucose (CBG)  5. Hearing decreased, bilateral  6. Visual changes  7. Screening for diabetes mellitus  8. Follow up He will follow up in 6 months.    No orders of the defined types were placed in this encounter.   Orders Placed This Encounter  Procedures  . Flu Vaccine QUAD 6+ mos PF IM (Fluarix Quad PF)  . Urinalysis Dipstick  . POCT HgB A1C  . Glucose (CBG)    Referral Orders  No referral(s) requested today    Kathe Becton,  MSN, FNP-BC [address] [place], [place] [address] [phone]- fax   Problem List Items Addressed This Visit      Cardiovascular and Mediastinum   Essential hypertension   Relevant Orders   Urinalysis Dipstick (Completed)     Genitourinary   Malignant neoplasm prostate ([place]) - Primary   Prostate cancer ([place])     Other   Hearing decreased, bilateral   Hyperglycemia   Relevant Orders   POCT HgB A1C (Completed)   Glucose (CBG) (Completed)   Visual changes    Other Visit Diagnoses    Follow up       Screening for diabetes mellitus          No orders of the defined types were placed in this encounter.   Follow-up: No follow-ups on file.    [name], FNP

## 2019-07-02 LAB — VITAMIN D 25 HYDROXY (VIT D DEFICIENCY, FRACTURES): Vit D, 25-Hydroxy: 39.1 ng/mL (ref 30.0–100.0)

## 2019-07-02 LAB — LIPID PANEL
Chol/HDL Ratio: 4.1 ratio (ref 0.0–5.0)
Cholesterol, Total: 183 mg/dL (ref 100–199)
HDL: 45 mg/dL (ref >39)
LDL Chol Calc (NIH): 111 mg/dL — ABNORMAL HIGH (ref 0–99)
Triglycerides: 152 mg/dL — ABNORMAL HIGH (ref 0–149)
VLDL Cholesterol Cal: 27 mg/dL (ref 5–40)

## 2019-07-02 LAB — TSH: TSH: 1.96 u[IU]/mL (ref 0.450–4.500)

## 2019-07-02 LAB — VITAMIN B12: Vitamin B-12: 1357 pg/mL — ABNORMAL HIGH (ref 232–1245)

## 2019-07-09 MED FILL — XTANDI 40 MG CAPSULE: 40 | 30 days supply | Qty: 60 | Fill #2

## 2019-07-18 ENCOUNTER — Telehealth: Payer: Self-pay | Admitting: Pharmacy Technician

## 2019-07-18 NOTE — Telephone Encounter (Signed)
Oral Oncology Patient Advocate Encounter  Patient has been approved for copay assistance with [employer] (TAF).  [employer] will cover all copayment expenses for Xtandi for the remainder of the calendar year.    The billing information is as follows and has been shared with [place].   Member ID: [id number] Group ID: [id number] PCN: [id number] BIN: [id number] Eligibility Dates: 07/12/19 to 07/10/20  Fund: [place] Patient [place] Phone [phone] Fax [phone] 07/18/2019 9:15 AM

## 2019-07-25 ENCOUNTER — Inpatient Hospital Stay: Payer: Medicare Other

## 2019-07-25 ENCOUNTER — Other Ambulatory Visit: Payer: Self-pay

## 2019-07-25 ENCOUNTER — Telehealth: Payer: Self-pay | Admitting: Oncology

## 2019-07-25 ENCOUNTER — Inpatient Hospital Stay: Payer: Medicare Other | Attending: Oncology | Admitting: Oncology

## 2019-07-25 VITALS — BP 144/77 | HR 93 | Temp 98.1°F | Resp 17 | Ht 72.0 in | Wt 203.7 lb

## 2019-07-25 DIAGNOSIS — C61 Malignant neoplasm of prostate: Secondary | ICD-10-CM

## 2019-07-25 DIAGNOSIS — R634 Abnormal weight loss: Secondary | ICD-10-CM | POA: Diagnosis not present

## 2019-07-25 DIAGNOSIS — R5383 Other fatigue: Secondary | ICD-10-CM | POA: Insufficient documentation

## 2019-07-25 DIAGNOSIS — Z9079 Acquired absence of other genital organ(s): Secondary | ICD-10-CM | POA: Diagnosis not present

## 2019-07-25 LAB — CMP (CANCER CENTER ONLY)
ALT: 45 U/L — ABNORMAL HIGH (ref 0–44)
AST: 50 U/L — ABNORMAL HIGH (ref 15–41)
Albumin: 2.8 g/dL — ABNORMAL LOW (ref 3.5–5.0)
Alkaline Phosphatase: 140 U/L — ABNORMAL HIGH (ref 38–126)
Anion gap: 12 (ref 5–15)
BUN: 12 mg/dL (ref 8–23)
CO2: 23 mmol/L (ref 22–32)
Calcium: 8.4 mg/dL — ABNORMAL LOW (ref 8.9–10.3)
Chloride: 104 mmol/L (ref 98–111)
Creatinine: 1.25 mg/dL — ABNORMAL HIGH (ref 0.61–1.24)
GFR, Est AFR Am: 59 mL/min — ABNORMAL LOW (ref >60)
GFR, Estimated: 51 mL/min — ABNORMAL LOW (ref >60)
Glucose, Bld: 199 mg/dL — ABNORMAL HIGH (ref 70–99)
Potassium: 3.5 mmol/L (ref 3.5–5.1)
Sodium: 139 mmol/L (ref 135–145)
Total Bilirubin: 0.9 mg/dL (ref 0.3–1.2)
Total Protein: 7.9 g/dL (ref 6.5–8.1)

## 2019-07-25 LAB — CBC WITH DIFFERENTIAL (CANCER CENTER ONLY)
Abs Immature Granulocytes: 0.07 10*3/uL (ref 0.00–0.07)
Basophils Absolute: 0 10*3/uL (ref 0.0–0.1)
Basophils Relative: 0 %
Eosinophils Absolute: 0 10*3/uL (ref 0.0–0.5)
Eosinophils Relative: 0 %
HCT: 31.5 % — ABNORMAL LOW (ref 39.0–52.0)
Hemoglobin: 10.1 g/dL — ABNORMAL LOW (ref 13.0–17.0)
Immature Granulocytes: 1 %
Lymphocytes Relative: 31 %
Lymphs Abs: 3.3 10*3/uL (ref 0.7–4.0)
MCH: 28.2 pg (ref 26.0–34.0)
MCHC: 32.1 g/dL (ref 30.0–36.0)
MCV: 88 fL (ref 80.0–100.0)
Monocytes Absolute: 0.5 10*3/uL (ref 0.1–1.0)
Monocytes Relative: 5 %
Neutro Abs: 7 10*3/uL (ref 1.7–7.7)
Neutrophils Relative %: 63 %
Platelet Count: 309 10*3/uL (ref 150–400)
RBC: 3.58 MIL/uL — ABNORMAL LOW (ref 4.22–5.81)
RDW: 14.1 % (ref 11.5–15.5)
WBC Count: 11 10*3/uL — ABNORMAL HIGH (ref 4.0–10.5)
nRBC: 0 % (ref 0.0–0.2)

## 2019-07-25 NOTE — Telephone Encounter (Signed)
Scheduled appt per 1/14 los.  Sent a message to HIM pool to get a calendar mailed out. 

## 2019-07-25 NOTE — Progress Notes (Signed)
Hematology and Oncology Follow Up Visit  Gregory [name]:[id number] March 12, 1929 86 y.o. 07/25/2019 9:40 AM Gregory [name], FNPStroud, Gregory Lunch, FNP   Principle Diagnosis: 86 year old man with castration-resistant prostate cancer with disease to the bone diagnosed in 2017.  He was found to have Gleason score 4+5 = 9 and a PSA of 18.7 at the time of diagnosis.  Prior Therapy: He is status post TURP procedure and orchiectomy in February 2017. He also received prophylactic radiation to the right femoral hip for a total of 30 gray in 10 fractions in the care of Dr. Tammi [name] completed in April 2017.  He developed castration resistant disease in July 2017.  He received Xofigo for a total of 6 treatments completed in March 2018. PSA was 5.15 in 10/26/2016 with castrate level testosterone. .   Current therapy: Xtandi 160 mg daily started in 01/2017.  The dose was reduced to 80 mg daily since June 2019 for better tolerance.  Interim History: Gregory [name] is here for repeat evaluation.  Since the last visit, he is experiencing more decline in his overall health.  He is not reporting any bone pain or pathological fractures but does report generalized fatigue and weight loss.  His mobility is reasonable although slowly declining.  He denies recent hospitalization or illnesses.  He denies any falls or syncope.           Medications: Updated on review.. Current Outpatient Medications  Medication Sig Dispense Refill  . metoprolol succinate (TOPROL-XL) 100 MG 24 hr tablet TAKE 1 TABLET (100 MG TOTAL) BY MOUTH DAILY. TAKE WITH OR IMMEDIATELY FOLLOWING A MEAL. 90 tablet 0  . Multiple Vitamins-Minerals (MULTIVITAMIN WITH MINERALS) tablet Take 1 tablet by mouth every morning.     . polyethylene glycol (MIRALAX / GLYCOLAX) packet Take 17 g by mouth daily as needed for mild constipation. 14 each 0  . tamsulosin (FLOMAX) 0.4 MG CAPS capsule Take 0.4 mg by mouth daily.  11  . XTANDI 40 MG capsule TAKE 2  CAPSULES (80 MG TOTAL) BY MOUTH DAILY. 60 capsule 2   No current facility-administered medications for this visit.     Allergies: No Known Allergies    Physical Exam: Blood pressure (!) 144/77, pulse 93, temperature 98.1 F (36.7 C), temperature source Temporal, resp. rate 17, height 6' (1.829 m), weight 203 lb 11.2 oz (92.4 kg), SpO2 100 %.   ECOG: 1    General appearance: Alert, awake without any distress. Head: Atraumatic without abnormalities Oropharynx: Without any thrush or ulcers. Eyes: No scleral icterus. Lymph nodes: No lymphadenopathy noted in the cervical, supraclavicular, or axillary nodes Heart:regular rate and rhythm, without any murmurs or gallops.   Lung: Clear to auscultation without any rhonchi, wheezes or dullness to percussion. Abdomin: Soft, nontender without any shifting dullness or ascites. Musculoskeletal: No clubbing or cyanosis. Neurological: No motor or sensory deficits. Skin: No rashes or lesions.        Lab Results: Lab Results  Component Value Date   WBC 11.0 (H) 07/25/2019   HGB 10.1 (L) 07/25/2019   HCT 31.5 (L) 07/25/2019   MCV 88.0 07/25/2019   PLT 309 07/25/2019     Chemistry      Component Value Date/Time   NA 143 05/23/2019 1109   NA 140 06/29/2017 1333   K 4.9 05/23/2019 1109   K 4.8 06/29/2017 1333   CL 104 05/23/2019 1109   CO2 29 05/23/2019 1109   CO2 25 06/29/2017 1333   BUN 17 05/23/2019  1109   BUN 10.2 06/29/2017 1333   CREATININE 1.09 05/23/2019 1109   CREATININE 1.1 06/29/2017 1333      Component Value Date/Time   CALCIUM 10.2 05/23/2019 1109   CALCIUM 10.1 06/29/2017 1333   ALKPHOS 79 05/23/2019 1109   ALKPHOS 57 06/29/2017 1333   AST 15 05/23/2019 1109   AST 15 06/29/2017 1333   ALT 8 05/23/2019 1109   ALT 9 06/29/2017 1333   BILITOT 0.5 05/23/2019 1109   BILITOT 0.64 06/29/2017 1333      Results for Gregory [name] (MRN [id number]) as of 07/25/2019 09:26  Ref. Range 05/23/2019 11:09  Prostate  Specific Ag, Serum Latest Ref Range: 0.0 - 4.0 ng/mL 310.0 (H)       Impression and Plan:  86 year old man with  1.  Advanced prostate cancer with disease to the bone is currently castration-resistant since 2017.   He is experiencing rapid progression of disease as evident by his rise in his PSA as well as recent clinical decline.  The natural course of this disease was reviewed today in detail as well as treatment options.  These options would include Xofigo or systemic chemotherapy.  He is not a candidate for PARP inhibitor given the fact that he has no actionable mutation at this time.  The rationale for using these medications as well as complication associated with him were reviewed.  I do not feel that Gregory [name] will offer much palliation at this time given the fact that he has really no bone pain.  Complication associated with chemotherapy was discussed to include nausea, vomiting, myelosuppression as well as neutropenia and neutropenic sepsis.  He is a skeptical about receiving chemotherapy given his overall age and overall prognosis.  After discussion today, he declined that option and opted to proceed with supportive care only.  I think this is a reasonable choice at this point.  I feel that chemotherapy can offer some transient palliation but given his rather aggressive disease this will be short-lived.  He also understands that he will continue to experience further decline will likely require hospice in the near future.  I recommend discontinuation of Xtandi as well.  2. Androgen depravation: He is status post orchiectomy without any additional androgen deprivation.  3. Bone directed therapy: He has been receiving Xgeva under the care of Dr. Diona [name].  4. Prognosis: Overall poor with limited life expectancy.  I recommended consideration for hospice in the near future as I anticipate further decline.  5. Follow-up: He will return in 2 months for repeat evaluation to assess his  overall status.  30  minutes was spent on this encounter.  Time was dedicated to reviewing his disease status, reviewing laboratory data, treatment options complications related to these options as well as our overall cancer prognosis and future plan of care.    Zola Button, MD 1/14/20219:40 AM

## 2019-07-26 LAB — PROSTATE-SPECIFIC AG, SERUM (LABCORP): Prostate Specific Ag, Serum: 524 ng/mL — ABNORMAL HIGH (ref 0.0–4.0)

## 2019-08-06 ENCOUNTER — Other Ambulatory Visit: Payer: Self-pay | Admitting: Oncology

## 2019-08-06 DIAGNOSIS — C61 Malignant neoplasm of prostate: Secondary | ICD-10-CM

## 2019-08-08 NOTE — Telephone Encounter (Signed)
Xtandi discontinued at 07/25/19 appointment.

## 2019-09-24 ENCOUNTER — Telehealth: Payer: Self-pay

## 2019-09-24 ENCOUNTER — Other Ambulatory Visit: Payer: Self-pay | Admitting: Family Medicine

## 2019-09-24 ENCOUNTER — Inpatient Hospital Stay: Payer: Medicare Other

## 2019-09-24 ENCOUNTER — Inpatient Hospital Stay: Payer: Medicare Other | Attending: Oncology | Admitting: Oncology

## 2019-09-24 ENCOUNTER — Telehealth: Payer: Self-pay | Admitting: Oncology

## 2019-09-24 DIAGNOSIS — I10 Essential (primary) hypertension: Secondary | ICD-10-CM

## 2019-09-24 NOTE — Telephone Encounter (Signed)
Called patient about missed appointments that were scheduled for today.  Patient states he did not know about appointments.  Scheduling message sent to reschedule missed appointments. Patient informed to expect a call from our scheduling department. Patient verbalized understanding.

## 2019-09-24 NOTE — Telephone Encounter (Signed)
R/s appt per 3/16 sch message - left message for pt with appt date and time

## 2019-10-10 ENCOUNTER — Inpatient Hospital Stay: Admitting: Oncology

## 2019-10-10 ENCOUNTER — Inpatient Hospital Stay

## 2019-12-30 ENCOUNTER — Ambulatory Visit: Payer: Self-pay | Admitting: Family Medicine

## 2020-01-01 ENCOUNTER — Inpatient Hospital Stay (HOSPITAL_COMMUNITY)
Admission: AD | Admit: 2020-01-01 | Discharge: 2020-01-03 | DRG: 542 | Disposition: A | Discharge status: Hospice | Attending: Internal Medicine | Admitting: Internal Medicine

## 2020-01-01 ENCOUNTER — Emergency Department (HOSPITAL_COMMUNITY)

## 2020-01-01 ENCOUNTER — Other Ambulatory Visit: Payer: Self-pay

## 2020-01-01 ENCOUNTER — Encounter (HOSPITAL_COMMUNITY): Payer: Self-pay | Admitting: Emergency Medicine

## 2020-01-01 DIAGNOSIS — Z7401 Bed confinement status: Secondary | ICD-10-CM

## 2020-01-01 DIAGNOSIS — D649 Anemia, unspecified: Secondary | ICD-10-CM

## 2020-01-01 DIAGNOSIS — I10 Essential (primary) hypertension: Secondary | ICD-10-CM | POA: Diagnosis present

## 2020-01-01 DIAGNOSIS — I110 Hypertensive heart disease with heart failure: Secondary | ICD-10-CM | POA: Diagnosis present

## 2020-01-01 DIAGNOSIS — Z6822 Body mass index (BMI) 22.0-22.9, adult: Secondary | ICD-10-CM

## 2020-01-01 DIAGNOSIS — Z20822 Contact with and (suspected) exposure to covid-19: Secondary | ICD-10-CM | POA: Diagnosis present

## 2020-01-01 DIAGNOSIS — C7952 Secondary malignant neoplasm of bone marrow: Principal | ICD-10-CM | POA: Diagnosis present

## 2020-01-01 DIAGNOSIS — C7951 Secondary malignant neoplasm of bone: Secondary | ICD-10-CM | POA: Diagnosis present

## 2020-01-01 DIAGNOSIS — Z923 Personal history of irradiation: Secondary | ICD-10-CM

## 2020-01-01 DIAGNOSIS — Z515 Encounter for palliative care: Secondary | ICD-10-CM | POA: Diagnosis present

## 2020-01-01 DIAGNOSIS — D7589 Other specified diseases of blood and blood-forming organs: Secondary | ICD-10-CM | POA: Diagnosis present

## 2020-01-01 DIAGNOSIS — K219 Gastro-esophageal reflux disease without esophagitis: Secondary | ICD-10-CM | POA: Diagnosis present

## 2020-01-01 DIAGNOSIS — E43 Unspecified severe protein-calorie malnutrition: Secondary | ICD-10-CM | POA: Diagnosis present

## 2020-01-01 DIAGNOSIS — N400 Enlarged prostate without lower urinary tract symptoms: Secondary | ICD-10-CM | POA: Diagnosis present

## 2020-01-01 DIAGNOSIS — Z66 Do not resuscitate: Secondary | ICD-10-CM | POA: Diagnosis present

## 2020-01-01 DIAGNOSIS — I5032 Chronic diastolic (congestive) heart failure: Secondary | ICD-10-CM | POA: Diagnosis present

## 2020-01-01 DIAGNOSIS — Z9221 Personal history of antineoplastic chemotherapy: Secondary | ICD-10-CM

## 2020-01-01 DIAGNOSIS — C61 Malignant neoplasm of prostate: Secondary | ICD-10-CM | POA: Diagnosis present

## 2020-01-01 DIAGNOSIS — D630 Anemia in neoplastic disease: Secondary | ICD-10-CM | POA: Diagnosis present

## 2020-01-01 DIAGNOSIS — R627 Adult failure to thrive: Secondary | ICD-10-CM | POA: Diagnosis present

## 2020-01-01 LAB — CBC WITH DIFFERENTIAL/PLATELET
Abs Immature Granulocytes: 0.24 10*3/uL — ABNORMAL HIGH (ref 0.00–0.07)
Basophils Absolute: 0 10*3/uL (ref 0.0–0.1)
Basophils Relative: 0 %
Eosinophils Absolute: 0.2 10*3/uL (ref 0.0–0.5)
Eosinophils Relative: 2 %
HCT: 13.5 % — ABNORMAL LOW (ref 39.0–52.0)
Hemoglobin: 3.8 g/dL — CL (ref 13.0–17.0)
Immature Granulocytes: 3 %
Lymphocytes Relative: 33 %
Lymphs Abs: 2.5 10*3/uL (ref 0.7–4.0)
MCH: 31.1 pg (ref 26.0–34.0)
MCHC: 28.1 g/dL — ABNORMAL LOW (ref 30.0–36.0)
MCV: 110.7 fL — ABNORMAL HIGH (ref 80.0–100.0)
Monocytes Absolute: 0.4 10*3/uL (ref 0.1–1.0)
Monocytes Relative: 5 %
Neutro Abs: 4.3 10*3/uL (ref 1.7–7.7)
Neutrophils Relative %: 57 %
Platelets: 88 10*3/uL — ABNORMAL LOW (ref 150–400)
RBC: 1.22 MIL/uL — ABNORMAL LOW (ref 4.22–5.81)
RDW: 22.9 % — ABNORMAL HIGH (ref 11.5–15.5)
WBC: 7.7 10*3/uL (ref 4.0–10.5)
nRBC: 2.2 % — ABNORMAL HIGH (ref 0.0–0.2)

## 2020-01-01 LAB — COMPREHENSIVE METABOLIC PANEL
ALT: 24 U/L (ref 0–44)
AST: 38 U/L (ref 15–41)
Albumin: 2.8 g/dL — ABNORMAL LOW (ref 3.5–5.0)
Alkaline Phosphatase: 143 U/L — ABNORMAL HIGH (ref 38–126)
Anion gap: 16 — ABNORMAL HIGH (ref 5–15)
BUN: 11 mg/dL (ref 8–23)
CO2: 22 mmol/L (ref 22–32)
Calcium: 8.8 mg/dL — ABNORMAL LOW (ref 8.9–10.3)
Chloride: 106 mmol/L (ref 98–111)
Creatinine, Ser: 0.82 mg/dL (ref 0.61–1.24)
GFR calc Af Amer: >60 mL/min (ref >60)
GFR calc non Af Amer: >60 mL/min (ref >60)
Glucose, Bld: 194 mg/dL — ABNORMAL HIGH (ref 70–99)
Potassium: 4.5 mmol/L (ref 3.5–5.1)
Sodium: 144 mmol/L (ref 135–145)
Total Bilirubin: 0.7 mg/dL (ref 0.3–1.2)
Total Protein: 7 g/dL (ref 6.5–8.1)

## 2020-01-01 LAB — PREPARE RBC (CROSSMATCH)

## 2020-01-01 LAB — SARS CORONAVIRUS 2 BY RT PCR (HOSPITAL ORDER, PERFORMED IN ~~LOC~~ HOSPITAL LAB): SARS Coronavirus 2: NEGATIVE

## 2020-01-01 LAB — POC OCCULT BLOOD, ED: Fecal Occult Bld: NEGATIVE

## 2020-01-01 LAB — ABO/RH: ABO/RH(D): A POS

## 2020-01-01 MED ORDER — ACETAMINOPHEN 325 MG PO TABS: 650.0000 mg | ORAL_TABLET | Freq: Four times a day (QID) | ORAL | Status: DC | PRN

## 2020-01-01 MED ORDER — POLYETHYLENE GLYCOL 3350 17 G PO PACK: 17.0000 g | PACK | Freq: Every day | ORAL | Status: DC | PRN

## 2020-01-01 MED ORDER — ONDANSETRON HCL 4 MG PO TABS: 4.0000 mg | ORAL_TABLET | Freq: Four times a day (QID) | ORAL | Status: DC | PRN

## 2020-01-01 MED ORDER — ENSURE ENLIVE PO LIQD
237.0000 mL | Freq: Two times a day (BID) | ORAL | Status: DC
  Administered 2020-01-02 – 2020-01-03 (×3): 237 mL via ORAL

## 2020-01-01 MED ORDER — LACTATED RINGERS IV BOLUS
1000.0000 mL | Freq: Once | INTRAVENOUS | Status: AC
  Administered 2020-01-01: 1000 mL via INTRAVENOUS

## 2020-01-01 MED ORDER — SODIUM CHLORIDE 0.9% IV SOLUTION: Freq: Once | INTRAVENOUS | Status: AC

## 2020-01-01 MED ORDER — ACETAMINOPHEN 650 MG RE SUPP
650.0000 mg | Freq: Four times a day (QID) | RECTAL | Status: DC | PRN
  Administered 2020-01-02: 650 mg via RECTAL
  Filled 2020-01-01: qty 1

## 2020-01-01 MED ORDER — METOPROLOL SUCCINATE ER 50 MG PO TB24: 100.0000 mg | ORAL_TABLET | Freq: Every day | ORAL | Status: DC

## 2020-01-01 MED ORDER — ONDANSETRON HCL 4 MG/2ML IJ SOLN: 4.0000 mg | Freq: Four times a day (QID) | INTRAMUSCULAR | Status: DC | PRN

## 2020-01-01 NOTE — H&P (Addendum)
History and Physical  Patient Name: Gregory [name]     ZOX:[id number]    DOB: 01-23-1929    DOA: 01/01/2020 PCP: [name], FNP  Patient coming from: Home  Chief Complaint: Syncope      HPI: Gregory [name] is a 86 y.o. M with hx of metastatic castrate resistant prostate cancer, HTN, and dCHF who presents with syncopal episode and confusion.  Patient is mostly confused, so there is a caveat that most history is collected from the family by phone.  Evidently the patient was in his normal state of health (mostly bedbound for the last several months, can make basic transfers with assistance maybe walk a few steps, but is interactive and without dementia) until about 1 week ago.  Starting in the last week, family have started to notice that he was more confused and disoriented and his speech, weaker, and had some "mouth twisting".  Then today he was on the bedside commode and syncopized.  In the ER, he was tachycardic, blood pressure normal.  Hemoglobin 3.8 and new macrocytosis MCV 110.  He was typed and screened and the hospitalist service were asked to evaluate for anemia.         ROS: Review of Systems  Constitutional: Negative for chills, fever and malaise/fatigue.  Respiratory: Negative for hemoptysis.   Cardiovascular: Negative for chest pain and palpitations.  Gastrointestinal: Negative for blood in stool and melena.  Genitourinary: Negative for hematuria.  Neurological: Positive for speech change, loss of consciousness and weakness. Negative for tremors, sensory change, focal weakness and seizures.          Past Medical History:  Diagnosis Date   Bone metastases (HCC)    BPH (benign prostatic hyperplasia)    Death of wife 10/16/2013   Foley catheter in place    Urinary Retension   GERD (gastroesophageal reflux disease)    Hypertension    Prostate cancer [facility])   urologist- dr dahlstedt/  oncologist-  dr manning    Gleason 4+5,  PSA 18.74,  w/  Visceral  and Extensive Bones METS--  pallitive radiation therapy and hormone therapy    Past Surgical History:  Procedure Laterality Date   MULTIPLE TOOTH EXTRACTIONS  1980's   ORCHIECTOMY Bilateral 08/27/2015   Procedure: ORCHIECTOMY;  Surgeon: Franchot Gallo, MD;  Location: [facility];  Service: Urology;  Laterality: Bilateral;   PROSTATE BIOPSY  1/16 10/17/2015   TRANSURETHRAL RESECTION OF PROSTATE N/A 08/27/2015   Procedure: TRANSURETHRAL RESECTION OF THE PROSTATE WITH GYRUS INSTRUMENTS;  Surgeon: Franchot Gallo, MD;  Location: [facility];  Service: Urology;  Laterality: N/A;    Social History: Patient lives with family at home.  The patient walks with a walker, but is mostly bedbound and uses a bedside commode.  Nonsmoker.  From [place] originally, worked in [place] for 40 years, now back.  No Known Allergies  Family history: family history includes Other (age of onset: 32) in his father; Tuberculosis (age of onset: 18) in his mother.  Prior to Admission medications   Medication Sig Start Date End Date Taking? Authorizing Provider  metoprolol succinate (TOPROL-XL) 100 MG 24 hr tablet TAKE 1 TABLET (100 MG TOTAL) BY MOUTH DAILY. TAKE WITH OR IMMEDIATELY FOLLOWING A MEAL. Patient not taking: Reported on 01/01/2020 09/24/19   [name], FNP  polyethylene glycol [facility] Floria Raveling) packet Take 17 g by mouth daily as needed for mild constipation. Patient not taking: Reported on 01/01/2020 09/05/15   Eliseo Squires,  Tomi Bamberger, DO       Physical Exam: BP 129/61 (BP Location: Right Arm)    Pulse (!) 106    Temp 98.2 F (36.8 C) (Oral)    Resp 16    SpO2 99%  General appearance: Thin elderly adult male, alert and in no obvious distress.   Eyes: Anicteric, conjunctiva pale, lids and lashes normal. PERRL.    ENT: No nasal deformity, discharge, epistaxis.  Hearing normal. OP moist without lesions.  Lips normal, edentulous.  Subungual pallor. Neck: No neck masses.  Trachea  midline.  No thyromegaly/tenderness. Lymph: No cervical or supraclavicular lymphadenopathy. Skin: Warm and dry.  Pale nailbeds.  No jaundice.  No suspicious rashes or lesions. Cardiac: RRR, nl S1-S2, no murmurs appreciated.  JVP not visible.  No LE edema.  Radial pulses 2+ and symmetric. Respiratory: Normal respiratory rate and rhythm.  CTAB without rales or wheezes. Abdomen: Abdomen soft.  No TTP or guarding. No ascites, distension, hepatosplenomegaly.   MSK: Diffuse loss of subcutaneous muscle mass and fat Neuro: Pupils equal round reactive to light, extraocular movements intact, speech is somewhat tangential and disoriented (he states that his name is " first name Beedle, last name Chidi".  Muscle strength is intact and symmetric. Psych: Sensorium intact and responding to questions, attention normal.  Behavior slightly disoriented.  Affect pleasant.  Judgment and insight appear impaired.     Labs on Admission:  I have personally reviewed following labs and imaging studies: CBC: Recent Labs  Lab 01/01/20 1229  WBC 7.7  NEUTROABS 4.3  HGB 3.8*  HCT 13.5*  MCV 110.7*  PLT 88*   Basic Metabolic Panel: Recent Labs  Lab 01/01/20 1229  NA 144  K 4.5  CL 106  CO2 22  GLUCOSE 194*  BUN 11  CREATININE 0.82  CALCIUM 8.8*   GFR: CrCl cannot be calculated (Unknown ideal weight.).  Liver Function Tests: Recent Labs  Lab 01/01/20 1229  AST 38  ALT 24  ALKPHOS 143*  BILITOT 0.7  PROT 7.0  ALBUMIN 2.8*      Radiological Exams on Admission: Personally reviewed chest x-ray shows no focal airspace or opacity.  Radiology read suggest diffuse sclerotic metastases: DG Chest Port 1 View  Result Date: 01/01/2020 CLINICAL DATA:  Syncope. EXAM: PORTABLE CHEST 1 VIEW COMPARISON:  09/02/2015 FINDINGS: Normal heart size. Chronic asymmetric elevation of right hemidiaphragm. No pleural effusions, pulmonary edema or airspace consolidation. Diffuse sclerotic bone metastases noted.  IMPRESSION: 1. No acute cardiopulmonary abnormalities. 2. Diffuse sclerotic bone metastases. Electronically Signed   By: Kerby Moors M.D.   On: 01/01/2020 13:16    EKG: Independently reviewed.  Sinus tachycardia, frequent PACs.  No ST changes..       Assessment/Plan   Symptomatic anemia No clinical blood loss at all.  FOBT negative.  Given the new profound macrocytosis, I suspect this anemia is due to infiltration of the bone marrow by his cancer.  In light of that it is incurable, will likely progress despite transfusion.  Transfusion is reasonable at this point from a palliative perspective given that he syncopized.  However I had a frank conversation with his daughter, Jeanne Ivan, primary decision maker, and explained that I suspect his anemia will return regardless of treatment.  At that point, likely comfort measures in a monitored setting would be best, she expressed understanding.  -transfuse 2 units tonight -evaluate symptoms after 2 units, if improved, can discharge back to home tomorrow with Hospice following. -If symptoms do not respond  to transfusion and his oral intake is poor, could consider transition to residential hospice from here     Metastatic prostate cancer Diagnosed years ago, metastatic for several years.  Castration resistant since 2018, was on Xtandi until last Jan 2021.  At that point, his functional status had declined significantly and his cancer was progressing on Xtandi, so all chemo was stopped, and he continued supportive cares only.  I spoke with Hospice.  He does not appear to be ready for [facility] at this time, but they will follow closely after discharge in case he deteriorates.     Hypertension Chronic diastolic CHF Blood pressure normal, no evidence of fluid overload. -Caution with overly aggressive transfusion -Hold home metoprolol  Severe protein calorie malnutrition     DVT prophylaxis: SCDs  Code Status: DNR  Family  Communication: DaughterPaulette  Disposition Plan: Anticipate transfusion tonight, re-evaluation tomorrow, possibly home after transfusion Consults called:  Admission status: OBSERVATION   At the point of initial evaluation, it is my clinical opinion that admission for OBSERVATION is reasonable and necessary because the patient's presenting complaints in the context of their chronic conditions represent sufficient risk of deterioration or significant morbidity to constitute reasonable grounds for close observation in the hospital setting, but that the patient may be medically stable for discharge from the hospital within 24 to 48 hours.    Medical decision making: Patient seen at 4:15 PM on 01/01/2020.  The patient was discussed with Dr. Maryan Rued.  What exists of the patient's chart was reviewed in depth and summarized above.  Clinical condition: stable.        [facility] Please page though [place] or Epic secure chat:  For password, contact charge nurse

## 2020-01-01 NOTE — ED Notes (Signed)
MD at bedside. 

## 2020-01-01 NOTE — Progress Notes (Addendum)
[facility])  Mr. Letts is our current hospice patient with a diagnosis of prostate cancer.  Family called our triage number this am requesting a call back.  Our triage was unable to reach them back.  Notified by ED staff that family contacted EMS to seek evaluation in the ED.  With hospice, pt has a DNR.  Noted hgb and that Mr. Camper would like to be treated.  Treatment decisions should be directed by the patient and his family.   [facility] will follow and resume services once he d/c from the hospital.  Granddaughter Jeanne Ivan is his NOK.  Venia Carbon RN, BSN, [facility] Liaison (epic chat or [phone])  **Per hospice SW, pt family may be open to residential hospice, if the pt is agreeable.  Today, 6/23, we do not have any more beds at [facility].  Anticipated hospital admission, discuss if they are willing to go to [facility] for end of life care once a bed is available.  [facility] will continue to follow and update hospital and family.  **Paulette states current plan will be to d/c home once ready and not [employer].  Will continue to follow to see what the best disposition plan will be after he is medically optimized.

## 2020-01-01 NOTE — Progress Notes (Addendum)
[facility] [address] [facility]) Hospitalized Hospice Patient  Gregory [name] is under hospice services with a terminal diagnosis of carcinoma of the prostate per Dr. Karie Georges with [facility]. Gregory [name] has been feeling increasingly weak and hallucinating per his family. Family activated EMS for further evaluation.  Hospice was notified by the ED staff. He is admitted with syncope related to anemia. This is a related hospital admission.  Pt is alert and close to his baseline, attempting to joke. Granddaughter, Gregory [name], says he is acting more like himself.  He is admitted for anemia and will be transfused. Currently, plan will be to d/c back home once medically optimized.  V/S:  98.2 oral, 129/61, HR 106, RR 16, SPO2 99% on O2 via [place] @ 3 lpm I&O: 1,000/ 0 Labs: alk phos 143, albumin 2.8, RBC 1.22, hgb 3.8, HCT 13.5, MCV 110, plt 88 Diagnostics:  Portable chest shows metastases  Problem List - symptomatic anemia - hgb 3.8, no overt bleeding, MCV 110, plans to transfuse 2 U PRBCs, if better can d/c in am - metastatic prostate cancer - on hospice  D/C planning - return home with family for now Gregory [name] - appear clear, family does not want any aggressive measures Family - updated IDT - updated  Once pt is ready for d/c, he will need ambulance transportation home.  Please use [facility] non-emergency transport as they contract this service for our active pts.  Venia Carbon RN, BSN, [facility] Liaison

## 2020-01-01 NOTE — ED Triage Notes (Signed)
Per [facility] pt from home where had a syncopal episode while on the toilet. EMS reports patient is a hospice patient. Family could not find DNR form at the home to send with EMS.

## 2020-01-01 NOTE — ED Provider Notes (Signed)
[facility] DEPT Provider Note   CSN: [id number] Arrival date & time: 01/01/20  1151     History Chief Complaint  Patient presents with  . Loss of Consciousness    Gregory [name] is a 86 y.o. male.  Patient is an 86 year old male with history of prostate cancer with bony mets on ongoing oral therapy, hypertension, GERD, diastolic heart dysfunction who is presenting today after a syncopal event at home.  Since January patient has noticed weight loss, poor appetite, fatigue and decreased mobility but reports its been more pronounced lately.  He states he feels tired all the time but denies significant shortness of breath and has no chest pain, abdominal pain.  Patient reports no black stools or bloody stools that he has noticed.  It was reported that today patient was getting up to go to the bathroom when he had a syncopal event.  Patient denies any palpitations, fever but has had a cough which is difficult to discern how long its been going on.  Patient is a hospice patient and family does confirm that he is a DNR.  When asked if patient would want a blood transfusion he said of course.  The history is provided by the patient, medical records and the EMS personnel.  Loss of Consciousness      Past Medical History:  Diagnosis Date  . Bone metastases ([place])   . BPH (benign prostatic hyperplasia)   . Death of wife 64  . Foley catheter in place    Urinary Retension  . GERD (gastroesophageal reflux disease)   . Hypertension   . Prostate cancer [facility])   urologist- dr dahlstedt/  oncologist-  dr manning    Gleason 4+5,  PSA 18.74,  w/  Visceral and Extensive Bones METS--  pallitive radiation therapy and hormone therapy    Patient Active Problem List   Diagnosis Date Noted  . Hearing decreased, bilateral 12/29/2018  . Visual changes 12/29/2018  . Difficult intravenous access   . Elevated troponin 09/01/2015  . Diastolic dysfunction with acute on  chronic heart failure ([place]) 09/01/2015  . AKI (acute kidney injury) ([place]) 08/30/2015  . Hyperkalemia 08/30/2015  . Prostate cancer ([place]) 08/29/2015  . Hematuria   . S/P TURP   . Urinary retention   . Metastatic adenocarcinoma to bone ([place]) 08/21/2015  . Essential hypertension 04/14/2015  . Malignant neoplasm prostate ([place]) 04/14/2015  . Hyperglycemia 04/14/2015  . Other fatigue 04/14/2015    Past Surgical History:  Procedure Laterality Date  . MULTIPLE TOOTH EXTRACTIONS  1980's  . ORCHIECTOMY Bilateral 08/27/2015   Procedure: ORCHIECTOMY;  Surgeon: Franchot Gallo, MD;  [facility];  Service: Urology;  Laterality: Bilateral;  . PROSTATE BIOPSY  1/16 /2017  . TRANSURETHRAL RESECTION OF PROSTATE N/A 08/27/2015   Procedure: TRANSURETHRAL RESECTION OF THE PROSTATE WITH GYRUS INSTRUMENTS;  Surgeon: Franchot Gallo, MD;  Location[facility]  Service: Urology;  Laterality: N/A;       Family History  Problem Relation Age of Onset  . Tuberculosis Mother 27  . Other Father 28    Social History   Tobacco Use  . Smoking status: Former Smoker    Packs/day: 0.25    Years: 30.00    Pack years: 7.50    Types: Cigarettes    Start date: 07/11/1992  . Smokeless tobacco: Never Used  Vaping Use  . Vaping Use: Never used  Substance Use Topics  . Alcohol use: No  Alcohol/week: 0.0 standard drinks  . Drug use: No    Home Medications Prior to Admission medications   Medication Sig Start Date End Date Taking? Authorizing Provider  metoprolol succinate (TOPROL-XL) 100 MG 24 hr tablet TAKE 1 TABLET (100 MG TOTAL) BY MOUTH DAILY. TAKE WITH OR IMMEDIATELY FOLLOWING A MEAL. 09/24/19   [name], FNP  Multiple Vitamins-Minerals (MULTIVITAMIN WITH MINERALS) tablet Take 1 tablet by mouth every morning.     [provider]  polyethylene glycol (MIRALAX / GLYCOLAX) packet Take 17 g by mouth daily as needed for mild constipation. 09/05/15    Geradine Girt, DO  tamsulosin (FLOMAX) 0.4 MG CAPS capsule Take 0.4 mg by mouth daily. 01/13/17   [provider]  XTANDI 40 MG capsule TAKE 2 CAPSULES (80 MG TOTAL) BY MOUTH DAILY. 08/06/19   Wyatt Portela, MD    Allergies    Patient has no known allergies.  Review of Systems   Review of Systems  Cardiovascular: Positive for syncope.  All other systems reviewed and are negative.   Physical Exam Updated Vital Signs BP (!) 122/56 (BP Location: Left Arm)   Pulse (!) 117   Temp 98.6 F (37 C) (Rectal)   Resp 20   SpO2 92%   Physical Exam Vitals and nursing note reviewed.  Constitutional:      General: He is not in acute distress.    Appearance: He is well-developed.  HENT:     Head: Normocephalic and atraumatic.     Nose: Nose normal.     Mouth/Throat:     Comments: Pale oral mucosa Eyes:     Pupils: Pupils are equal, round, and reactive to light.     Comments: Pale conjunctiva  Cardiovascular:     Rate and Rhythm: Regular rhythm. Tachycardia present.     Heart sounds: Murmur heard.      Comments: 1+ pulse in bilateral DP Pulmonary:     Effort: Pulmonary effort is normal. No respiratory distress.     Breath sounds: Normal breath sounds. No wheezing or rales.  Chest:     Chest wall: No tenderness.  Abdominal:     General: There is no distension.     Palpations: Abdomen is soft.     Tenderness: There is no abdominal tenderness. There is no guarding or rebound.  Musculoskeletal:        General: No tenderness. Normal range of motion.     Cervical back: Normal range of motion and neck supple.     Right lower leg: No edema.     Left lower leg: No edema.     Comments: Feet are cool to the touch  Skin:    General: Skin is warm and dry.     Findings: No erythema or rash.  Neurological:     General: No focal deficit present.     Mental Status: He is alert and oriented to person, place, and time. Mental status is at baseline.  Psychiatric:        Mood and  Affect: Mood normal.        Behavior: Behavior normal.        Thought Content: Thought content normal.     ED Results / Procedures / Treatments   Labs (all labs ordered are listed, but only abnormal results are displayed) Labs Reviewed  CBC WITH DIFFERENTIAL/PLATELET - Abnormal; Notable for the following components:      Result Value   RBC 1.22 (*)  Hemoglobin 3.8 (*)    HCT 13.5 (*)    MCV 110.7 (*)    MCHC 28.1 (*)    RDW 22.9 (*)    Platelets 88 (*)    nRBC 2.2 (*)    All other components within normal limits  COMPREHENSIVE METABOLIC PANEL - Abnormal; Notable for the following components:   Glucose, Bld 194 (*)    Calcium 8.8 (*)    Albumin 2.8 (*)    Alkaline Phosphatase 143 (*)    Anion gap 16 (*)    All other components within normal limits  POC OCCULT BLOOD, ED  TYPE AND SCREEN  PREPARE RBC (CROSSMATCH)  ABO/RH    EKG EKG Interpretation  Date/Time:  Wednesday January 01 2020 12:09:41 EDT Ventricular Rate:  111 PR Interval:    QRS Duration: 92 QT Interval:  277 QTC Calculation: 377 R Axis:   78 Text Interpretation: Sinus tachycardia Atrial premature complexes RSR' in V1 or V2, right VCD or RVH Nonspecific ST and T wave abnormality Baseline wander in lead(s) V6 No significant change since last tracing Confirmed by Blanchie Dessert ([id number]) on 01/01/2020 12:22:38 PM   Radiology DG Chest Port 1 View  Result Date: 01/01/2020 CLINICAL DATA:  Syncope. EXAM: PORTABLE CHEST 1 VIEW COMPARISON:  09/02/2015 FINDINGS: Normal heart size. Chronic asymmetric elevation of right hemidiaphragm. No pleural effusions, pulmonary edema or airspace consolidation. Diffuse sclerotic bone metastases noted. IMPRESSION: 1. No acute cardiopulmonary abnormalities. 2. Diffuse sclerotic bone metastases. Electronically Signed   By: Kerby Moors M.D.   On: 01/01/2020 13:16    Procedures Procedures (including critical care time)  Medications Ordered in ED Medications  lactated ringers  bolus 1,000 mL (1,000 mLs Intravenous New Bag/Given 01/01/20 1240)    ED Course  I have reviewed the triage vital signs and the nursing notes.  Pertinent labs & imaging results that were available during my care of the patient were reviewed by me and considered in my medical decision making (see chart for details).    MDM Rules/Calculators/A&P                           Elderly male presenting today after a syncopal event at home.  Patient is found to be tachycardic here but stable blood pressure.  Patient appears extremely pale and with his complaints of being generally fatigued and now syncope today concern for anemia.  Patient denies any chest pain or shortness of breath but given cancer history would also be at risk for PE.  He does have a mild cough here but lower suspicion for infectious etiology as patient has normal temperature.  Patient's EKG with nonspecific ST changes that are not significantly different than prior EKGs.  Labs including a type and screen are pending.  Chest x-ray pending.  Patient also reports poor oral intake and also concerns for AKI or electrolyte abnormalities.  Will give bolus of fluid and reevaluate.  2:18 PM Chest x-ray without acute pathology.  Patient did respond to IV fluids with improvement in heart rate.  CMP without acute changes with chronic hypocalcemia but today hemoglobin is 3.8 and new thrombocytopenia as well with a platelet count of 88.  Last hemoglobin in January was 11.  Hemoccult was done but stool is brown in appearance.  Discussed with patient if he desires a blood transfusion and he reports he does.  Patient's daughter is on her way and will also discuss this with her.  Hospice  was notified.  CRITICAL CARE Performed by: Kaesha Kirsch Total critical care time: 30 minutes Critical care time was exclusive of separately billable procedures and treating other patients. Critical care was necessary to treat or prevent imminent or life-threatening  deterioration. Critical care was time spent personally by me on the following activities: development of treatment plan with patient and/or surrogate as well as nursing, discussions with consultants, evaluation of patient's response to treatment, examination of patient, obtaining history from patient or surrogate, ordering and performing treatments and interventions, ordering and review of laboratory studies, ordering and review of radiographic studies, pulse oximetry and re-evaluation of patient's condition.  MDM Number of Diagnoses or Management Options   Amount and/or Complexity of Data Reviewed Clinical lab tests: ordered and reviewed Tests in the radiology section of CPT: ordered and reviewed Tests in the medicine section of CPT: ordered and reviewed Decide to obtain previous medical records or to obtain history from someone other than the patient: yes Obtain history from someone other than the patient: yes Review and summarize past medical records: yes Discuss the patient with other providers: yes Independent visualization of images, tracings, or specimens: yes  Risk of Complications, Morbidity, and/or Mortality Presenting problems: high Diagnostic procedures: low Management options: moderate  Patient Progress Patient progress: improved    Final Clinical Impression(s) / ED Diagnoses Final diagnoses:  Symptomatic anemia    Rx / DC Orders ED Discharge Orders    None       Blanchie Dessert, MD 01/01/20 1420

## 2020-01-02 DIAGNOSIS — N400 Enlarged prostate without lower urinary tract symptoms: Secondary | ICD-10-CM | POA: Diagnosis present

## 2020-01-02 DIAGNOSIS — D630 Anemia in neoplastic disease: Secondary | ICD-10-CM | POA: Diagnosis present

## 2020-01-02 DIAGNOSIS — Z7401 Bed confinement status: Secondary | ICD-10-CM | POA: Diagnosis not present

## 2020-01-02 DIAGNOSIS — Z9221 Personal history of antineoplastic chemotherapy: Secondary | ICD-10-CM | POA: Diagnosis not present

## 2020-01-02 DIAGNOSIS — C7952 Secondary malignant neoplasm of bone marrow: Secondary | ICD-10-CM | POA: Diagnosis present

## 2020-01-02 DIAGNOSIS — Z6822 Body mass index (BMI) 22.0-22.9, adult: Secondary | ICD-10-CM | POA: Diagnosis not present

## 2020-01-02 DIAGNOSIS — I5032 Chronic diastolic (congestive) heart failure: Secondary | ICD-10-CM | POA: Diagnosis present

## 2020-01-02 DIAGNOSIS — C7951 Secondary malignant neoplasm of bone: Secondary | ICD-10-CM | POA: Diagnosis present

## 2020-01-02 DIAGNOSIS — Z515 Encounter for palliative care: Secondary | ICD-10-CM | POA: Diagnosis present

## 2020-01-02 DIAGNOSIS — C61 Malignant neoplasm of prostate: Secondary | ICD-10-CM | POA: Diagnosis present

## 2020-01-02 DIAGNOSIS — D7589 Other specified diseases of blood and blood-forming organs: Secondary | ICD-10-CM | POA: Diagnosis present

## 2020-01-02 DIAGNOSIS — K219 Gastro-esophageal reflux disease without esophagitis: Secondary | ICD-10-CM | POA: Diagnosis present

## 2020-01-02 DIAGNOSIS — Z66 Do not resuscitate: Secondary | ICD-10-CM | POA: Diagnosis present

## 2020-01-02 DIAGNOSIS — R627 Adult failure to thrive: Secondary | ICD-10-CM | POA: Diagnosis present

## 2020-01-02 DIAGNOSIS — D649 Anemia, unspecified: Secondary | ICD-10-CM | POA: Diagnosis present

## 2020-01-02 DIAGNOSIS — Z20822 Contact with and (suspected) exposure to covid-19: Secondary | ICD-10-CM | POA: Diagnosis present

## 2020-01-02 DIAGNOSIS — I110 Hypertensive heart disease with heart failure: Secondary | ICD-10-CM | POA: Diagnosis present

## 2020-01-02 DIAGNOSIS — Z923 Personal history of irradiation: Secondary | ICD-10-CM | POA: Diagnosis not present

## 2020-01-02 DIAGNOSIS — E43 Unspecified severe protein-calorie malnutrition: Secondary | ICD-10-CM | POA: Diagnosis present

## 2020-01-02 LAB — HEMOGLOBIN AND HEMATOCRIT, BLOOD
HCT: 24 % — ABNORMAL LOW (ref 39.0–52.0)
Hemoglobin: 7.8 g/dL — ABNORMAL LOW (ref 13.0–17.0)

## 2020-01-02 LAB — GLUCOSE, CAPILLARY: Glucose-Capillary: 150 mg/dL — ABNORMAL HIGH (ref 70–99)

## 2020-01-02 MED ORDER — LORAZEPAM 2 MG/ML IJ SOLN
0.5000 mg | Freq: Once | INTRAMUSCULAR | Status: AC
  Administered 2020-01-02: 0.5 mg via INTRAVENOUS
  Filled 2020-01-02: qty 1

## 2020-01-02 NOTE — Progress Notes (Signed)
PROGRESS NOTE    Gregory [name]    Code Status: DNR  RWE:[id number] DOB: [birth year] DOA: 01/01/2020 LOS: 1 days  PCP: [name], FNP CC:  Chief Complaint  Patient presents with  . Loss of Consciousness       Hospital Summary   This is an 86 year old male with history of metastatic castrate resistant prostate cancer previously on Xtandi and discontinued in January 2021, hypertension, diastolic heart failure who presented with a syncopal episode and confusion.  He has been mostly bedbound for the past several months until about 1 week ago at which point he has had noticeable decline by his family with more confusion, disorientation and generalized weakness.  He did syncopized on the bedside commode on 6/23 prompting him to come to the ED.  In the ER, he was tachycardic, blood pressure normal.  Hemoglobin 3.8 and new macrocytosis MCV 110.  He was transfused 2 units PRBCs.  Palliative care was consulted.   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 Syncope secondary to symptomatic anemia a. S/p 2 unit PRBCs with improved Hb b. Anemia probably from infiltration of the bone marrow by his cancer c. Palliative care on board, possible discharge to residential hospice when medically stable.  Appreciate further palliative care insight  2. Failure to thrive, likely secondary to progressive metastatic prostate cancer as well as anemia a. Nutritionist was consulted b. Appreciate further palliative care recommendations  3. Metastatic castrate resistant prostate cancer a. Diagnosed several years ago and castration resistant since 2018, previously on Xtandi until January 2021 when he was transition to supportive care only b. As above  4. Hypertension, stable  5. Compensated HFpEF  6. Severe protein  calorie malnutrition   DVT prophylaxis: SCDs Start: 01/01/20 1729   Family Communication: Patient's family has been updated by palliative care  Disposition Plan:  Status is: Inpatient  Remains inpatient appropriate because:Unsafe d/c plan   Dispo: The patient is from: Home              Anticipated d/c is to: TBD              Anticipated d/c date is: 1 day              Patient currently is not medically stable to d/c.          Pressure injury documentation    None  Consultants  Palliative care   Procedures  None  Antibiotics   Anti-infectives (From admission, onward)   None        Subjective   Patient is a poor historian but currently denies any complaints.  No overnight events.  Objective   Vitals:   01/01/20 2208 01/02/20 0154 01/02/20 0534 01/02/20 1340  BP: 131/67 (!) 111/57 108/60 107/63  Pulse: (!) 102 93 75 90  Resp: 16 16 15 18   Temp: 100 F (37.8 C) 100.3 F (37.9 C) 97.9 F (36.6 C) (!) 97.5 F (36.4 C)  TempSrc: Oral Axillary Oral   SpO2: 99% 100% 100%     Intake/Output Summary (Last 24 hours) at 01/02/2020 1623 Last data filed at 01/02/2020 1340 Gross per 24 hour  Intake 1094.59 ml  Output 700 ml  Net 394.59 ml   There were no vitals filed for this visit.  Examination:  Physical Exam Vitals and nursing note reviewed.  Constitutional:      Comments: Chronically ill-appearing Edentulous  HENT:     Head: Normocephalic and atraumatic.  Eyes:     Conjunctiva/sclera: Conjunctivae normal.  Cardiovascular:     Rate and Rhythm: Normal rate and regular rhythm.  Pulmonary:     Effort: Pulmonary effort is normal.     Breath sounds: Normal breath sounds.  Abdominal:     General: Abdomen is flat.     Palpations: Abdomen is soft.  Musculoskeletal:        General: No swelling or tenderness.  Skin:    Coloration: Skin is not jaundiced or pale.  Neurological:     Mental Status: He is alert.     Data Reviewed: I have  personally reviewed following labs and imaging studies  CBC: Recent Labs  Lab 01/01/20 1229 01/02/20 0039  WBC 7.7  --   NEUTROABS 4.3  --   HGB 3.8* 7.8*  HCT 13.5* 24.0*  MCV 110.7*  --   PLT 88*  --    Basic Metabolic Panel: Recent Labs  Lab 01/01/20 1229  NA 144  K 4.5  CL 106  CO2 22  GLUCOSE 194*  BUN 11  CREATININE 0.82  CALCIUM 8.8*   GFR: CrCl cannot be calculated (Unknown ideal weight.). Liver Function Tests: Recent Labs  Lab 01/01/20 1229  AST 38  ALT 24  ALKPHOS 143*  BILITOT 0.7  PROT 7.0  ALBUMIN 2.8*   No results for input(s): LIPASE, AMYLASE in the last 168 hours. No results for input(s): AMMONIA in the last 168 hours. Coagulation Profile: No results for input(s): INR, PROTIME in the last 168 hours. Cardiac Enzymes: No results for input(s): CKTOTAL, CKMB, CKMBINDEX, TROPONINI in the last 168 hours. BNP (last 3 results) No results for input(s): PROBNP in the last 8760 hours. HbA1C: No results for input(s): HGBA1C in the last 72 hours. CBG: Recent Labs  Lab 01/02/20 0241  GLUCAP 150*   Lipid Profile: No results for input(s): CHOL, HDL, LDLCALC, TRIG, CHOLHDL, LDLDIRECT in the last 72 hours. Thyroid Function Tests: No results for input(s): TSH, T4TOTAL, FREET4, T3FREE, THYROIDAB in the last 72 hours. Anemia Panel: No results for input(s): VITAMINB12, FOLATE, FERRITIN, TIBC, IRON, RETICCTPCT in the last 72 hours. Sepsis Labs: No results for input(s): PROCALCITON, LATICACIDVEN in the last 168 hours.  Recent Results (from the past 240 hour(s))  SARS Coronavirus 2 by RT PCR (hospital order, performed in [facility] hospital lab) Nasopharyngeal Nasopharyngeal Swab     Status: None   Collection Time: 01/01/20  3:17 PM   Specimen: Nasopharyngeal Swab  Result Value Ref Range Status   SARS Coronavirus 2 NEGATIVE NEGATIVE Final    Comment: (NOTE) SARS-CoV-2 target nucleic acids are NOT DETECTED.  The SARS-CoV-2 RNA is generally detectable  in upper and lower respiratory specimens during the acute phase of infection. The lowest concentration of SARS-CoV-2 viral copies this assay can detect is 250 copies / mL. A negative result does not preclude SARS-CoV-2 infection and should not be used as the sole basis for treatment or other patient management decisions.  A negative result may occur with improper specimen collection / handling, submission of specimen other than nasopharyngeal swab, presence of viral mutation(s) within the areas targeted by this assay, and inadequate number of viral copies (<250 copies / mL). A negative result must be combined with clinical observations, patient history, and epidemiological information.  Fact Sheet for Patients:  StrictlyIdeas.no  Fact Sheet for Healthcare Providers: BankingDealers.co.za  This test is not yet approved or  cleared by the [place] FDA and has been authorized for detection and/or diagnosis of SARS-CoV-2 by FDA under an Emergency Use Authorization (EUA).  This EUA will remain in effect (meaning this test can be used) for the duration of the COVID-19 declaration under Section 564(b)(1) of the Act, 21 U.S.C. section 360bbb-3(b)(1), unless the authorization is terminated or revoked sooner.  Performed at [facility], [place] [address], [place], [place] [address]          Radiology Studies: [facility] Chest Port 1 View  Result Date: 01/01/2020 CLINICAL DATA:  Syncope. EXAM: PORTABLE CHEST 1 VIEW COMPARISON:  09/02/2015 FINDINGS: Normal heart size. Chronic asymmetric elevation of right hemidiaphragm. No pleural effusions, pulmonary edema or airspace consolidation. Diffuse sclerotic bone metastases noted. IMPRESSION: 1. No acute cardiopulmonary abnormalities. 2. Diffuse sclerotic bone metastases. Electronically Signed   By: Kerby Moors M.D.   On: 01/01/2020 13:16        Scheduled Meds: . feeding  supplement (ENSURE ENLIVE)  237 mL Oral BID BM   Continuous Infusions:   Time spent: 26 minutes with over 50% of the time coordinating the patient's care    Harold Hedge, DO [facility] Pager [phone]  Call night coverage person covering after 7pm

## 2020-01-02 NOTE — Progress Notes (Signed)
Palliative Medicine RN Note: Consult order noted to discuss [employer] and [employer]. Gregory [name] is admitted under the GIP benefit of his hospice admission with [facility], [facility] ([facility]   Their team discusses [place] and discharge planning/location of care after discharge with family as part of the hospice care plan. I have sent a secure message to the [facility] liaisons, Dr Neysa Bonito, and our attending, Dr Rowe Pavy, to make sure everyone is aware of the situation and the request to discuss [facility] with Gregory Strey and his family.   At this time, we will continue to monitor his progress. I see he was hoping to go home this morning but ran a temp and had some seizure-like activity this morning.  Marjie Skiff Babita Amaker, RN, BSN, [facility] Palliative Medicine Team 01/02/2020 8:29 AM Office [phone]

## 2020-01-02 NOTE — Progress Notes (Signed)
Nutrition Brief Note  Patient identified on the Malnutrition Screening Tool (MST) Report  RD working remotely.  86 year old male with history of prostate cancer metastatic to bone on oral therapy and under hospice services,  HTN, GERD, dCHD presented after syncopal episode at home admitted for symptomatic anemia.  In ED, Hgb 3.8 and new macrocytosis MCV 110, s/p 2 units PRBC.  Per chart, last chemo in January 2021. Pt has been mostly bed bound for the last several months, able to make basic transfers with assistance and able to walk a few steps, but interactive at baseline and without dementia. Over the past week, family noticed pt was more confused, disoriented, and speech was weaker. Family reports noticeable wt loss, poor appetite, fatigue that has progressed since January. Last weight 203.28 lb on 07/25/19.  Noted fever 100.3 as well as seizure-like activity this morning. Anemia is suspected d/t infiltration of the bone marrow by cancer, likely to return regardless of treatment. Plans to d/c home with hospice vs transition to residential with comfort measures.  He is on a regular diet, and receiving Ensure supplement BID per medication review. Medications and labs reviewed. No further nutrition interventions warranted at this time. Please consult as needed.   Lajuan Lines, RD, LDN Clinical Nutrition After Hours/Weekend Pager # in [place]

## 2020-01-02 NOTE — Progress Notes (Signed)
Pt has had a couple episodes of seizure like shaking that last ~ 1 minute. Pt becomes unable to speak, but never looses consciousness. Temp is slightly elevated @ 100.3, but rest of VS wnl. Notified [facility] on-call hospitalist. New orders to give a dose of tylenol and one dose of 0.5mg  IV Ativan. New orders implemented and will continue to monitor.

## 2020-01-02 NOTE — Evaluation (Signed)
Physical Therapy Evaluation Patient Details Name: Gregory [name] MRN: [id number] DOB: 1929-06-08 Today's Date: 01/02/2020   History of Present Illness  Pt admitted with syncope 2* symptomatic anemia and now s/t tranfusion 2 units RBC with hgb 7.8.  Pt with hx of metastatic protate CA and diastolic CHF  Clinical Impression  Pt admitted as above and presenting with functional mobility limitations 2* generalized weakness, ambulatory balance deficits and limited endurance.  Pt should progress to dc home and states he has 24/7 assist of grandchildren as needed.  Pt would benefit from follow up HHPT to further address deficits and maximize IND and safety.    Follow Up Recommendations Home health PT;Supervision/Assistance - 24 hour    Equipment Recommendations  None recommended by PT    Recommendations for Other Services       Precautions / Restrictions Precautions Precautions: Fall Restrictions Weight Bearing Restrictions: No      Mobility  Bed Mobility Overal bed mobility: Needs Assistance Bed Mobility: Supine to Sit     Supine to sit: Min guard     General bed mobility comments: Increased time, min guard for safety and use of bed rail  Transfers Overall transfer level: Needs assistance Equipment used: Rolling walker (2 wheeled) Transfers: Sit to/from Stand Sit to Stand: Min assist;From elevated surface         General transfer comment: cues for use of UEs to self assist; min assist to bring wt up and fwd and to balance in initial standing  Ambulation/Gait Ambulation/Gait assistance: Min assist Gait Distance (Feet): 36 Feet Assistive device: Rolling walker (2 wheeled) Gait Pattern/deviations: Step-to pattern;Decreased step length - right;Decreased step length - left;Shuffle;Trunk flexed Gait velocity: decr   General Gait Details: cues for posture and position from RW with physical assist for balance and RW management  Stairs            Wheelchair  Mobility    Modified Rankin (Stroke Patients Only)       Balance Overall balance assessment: Needs assistance Sitting-balance support: No upper extremity supported;Feet supported Sitting balance-Leahy Scale: Good     Standing balance support: Bilateral upper extremity supported Standing balance-Leahy Scale: Poor                               Pertinent Vitals/Pain Pain Assessment: No/denies pain    Home Living Family/patient expects to be discharged to:: Private residence Living Arrangements: Other relatives (grandchildren) Available Help at Discharge: Family Type of Home: House Home Access: Level entry     Home Layout: One level Home Equipment: Environmental consultant - 4 wheels;Bedside commode      Prior Function Level of Independence: Needs assistance   Gait / Transfers Assistance Needed: Pt states he can walk 6 or 7 feet and then his legs give out  ADL's / Homemaking Assistance Needed: Assist of family        Hand Dominance        Extremity/Trunk Assessment   Upper Extremity Assessment Upper Extremity Assessment: Generalized weakness    Lower Extremity Assessment Lower Extremity Assessment: Generalized weakness       Communication   Communication: Expressive difficulties  Cognition Arousal/Alertness: Awake/alert Behavior During Therapy: WFL for tasks assessed/performed Overall Cognitive Status: Within Functional Limits for tasks assessed  General Comments      Exercises     Assessment/Plan    PT Assessment Patient needs continued PT services  PT Problem List Decreased strength;Decreased activity tolerance;Decreased balance;Decreased mobility;Decreased knowledge of use of DME       PT Treatment Interventions DME instruction;Gait training;Functional mobility training;Therapeutic activities;Therapeutic exercise;Balance training;Patient/family education    PT Goals (Current goals can be  found in the Care Plan section)  Acute Rehab PT Goals Patient Stated Goal: Home PT Goal Formulation: With patient Time For Goal Achievement: 01/16/20 Potential to Achieve Goals: Fair    Frequency Min 3X/week   Barriers to discharge        Co-evaluation               AM-PAC PT "6 Clicks" Mobility  Outcome Measure Help needed turning from your back to your side while in a flat bed without using bedrails?: A Little Help needed moving from lying on your back to sitting on the side of a flat bed without using bedrails?: A Little Help needed moving to and from a bed to a chair (including a wheelchair)?: A Little Help needed standing up from a chair using your arms (e.g., wheelchair or bedside chair)?: A Little Help needed to walk in hospital room?: A Little Help needed climbing 3-5 steps with a railing? : A Lot 6 Click Score: 17    End of Session Equipment Utilized During Treatment: Gait belt Activity Tolerance: Patient tolerated treatment well;Patient limited by fatigue Patient left: in chair;with call bell/phone within reach;with chair alarm set Nurse Communication: Mobility status PT Visit Diagnosis: Difficulty in walking, not elsewhere classified (R26.2);Muscle weakness (generalized) (M62.81)    Time: 0240-9735 PT Time Calculation (min) (ACUTE ONLY): 26 min   Charges:   PT Evaluation $PT Eval Low Complexity: 1 Low PT Treatments $Gait Training: 8-22 mins        [place] Pager [phone] Office [phone]   [facility] 01/02/2020, 5:38 PM

## 2020-01-02 NOTE — Plan of Care (Signed)

## 2020-01-02 NOTE — Progress Notes (Addendum)
[facility] [address] [facility]) Hospitalized Hospice Patient    Gregory [name] is under hospice services with a terminal diagnosis of carcinoma of the prostate per Dr. Karie Georges with [facility]. Gregory [name] has been feeling increasingly weak and hallucinating per his family. Family activated EMS for further evaluation.  Hospice was notified by the ED staff. He is admitted with syncope related to anemia. This is a related hospital admission.   Visited patient at bedside. Pt AOx2, denied pain or dyspnea.  Pt was pleasantly confused.  Physical therapy at bedside to evaluate patient.  No family present.  Bedside RN reports pt drank supplement with good appetite, attempted to feed self from lunch tray.  Spoke by phone with daughter Gregory [name] who reports being told that patient is in his last few days to two weeks.  Gregory [name] stated that if that is true then she would want pt to go to [facility]. [facility] MD to review chart to assess eligibility.   V/S:  97.5 oral, 107/63, 90 HR, SpO2 100% on O2 via [place] @ 3 lpm I&O: 120/250 Labs: only new lab:  hgb 7.8 Diagnostics:  Portable chest shows metastases   Problem List 1. Syncope secondary to symptomatic anemia a. S/p 2 unit PRBCs with improved Hb b. Anemia probably from infiltration of the bone marrow by his cancer c. Palliative care on board, possible discharge to residential hospice when medically stable.  Appreciate further palliative care insight   2. Failure to thrive, likely secondary to progressive metastatic prostate cancer as well as anemia a. Nutritionist was consulted b. Appreciate further palliative care recommendations   3. Metastatic castrate resistant prostate cancer a. Diagnosed several years ago and castration resistant since 2018, previously on Xtandi until January 2021 when he was transition to supportive care only b. As above    D/C planning - Daughter Gregory [name] reports she would want him to go to [facility] if he were in his last two  weeks GOC - appear clear, family does not want any aggressive measures Family - spoke with Gregory [name] by phone IDT - updated   Once pt is ready for d/c, he will need ambulance transportation home.  Please use [facility] non-emergency transport as they contract this service for our active pts.    Gregory [name], BSN, Dentist  [phone] (24h on call)

## 2020-01-03 LAB — CBC
HCT: 23.1 % — ABNORMAL LOW (ref 39.0–52.0)
Hemoglobin: 7.2 g/dL — ABNORMAL LOW (ref 13.0–17.0)
MCH: 30.3 pg (ref 26.0–34.0)
MCHC: 31.2 g/dL (ref 30.0–36.0)
MCV: 97.1 fL (ref 80.0–100.0)
Platelets: 62 10*3/uL — ABNORMAL LOW (ref 150–400)
RBC: 2.38 MIL/uL — ABNORMAL LOW (ref 4.22–5.81)
RDW: 20.5 % — ABNORMAL HIGH (ref 11.5–15.5)
WBC: 5.8 10*3/uL (ref 4.0–10.5)
nRBC: 2.1 % — ABNORMAL HIGH (ref 0.0–0.2)

## 2020-01-03 NOTE — Plan of Care (Signed)
Writer spoke with Pt's daughter regarding discharge instructions. She verbalized understanding. All questions were answered. SW called EMS for transport.

## 2020-01-03 NOTE — Discharge Summary (Signed)
Physician Discharge Summary  Gregory [name]:[id number] DOB: Jun 04, 1929   PCP: [name], FNP  Admit date: 01/01/2020 Discharge date: 01/03/2020 Length of Stay: 2 days   Code Status: DNR  Admitted From:  Home Discharged to:  Home Hospice Discharge Condition: poor prognosis  Recommendations for Outpatient Follow-up   1. Follow up with hospice services  Hospital Summary   This is an 86 year old male with history of metastatic castrate resistant prostate cancer previously on Xtandi and discontinued in January 2021, hypertension, diastolic heart failure who presented with a syncopal episode and confusion.  He has been mostly bedbound for the past several months until about 1 week ago at which point he has had noticeable decline by his family with more confusion, disorientation and generalized weakness.  He did syncopized on the bedside commode on 6/23 prompting him to come to the ED.  In the ER, he was tachycardic, blood pressure normal. Hemoglobin 3.8 and new macrocytosis MCV 110.  He was transfused 2 units PRBCs.  Palliative care was consulted.  Patient did not have recurrence of syncope and had improvement of symptoms. Unfortunately he has a poor prognosis of days to weeks. 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 Syncope secondary to symptomatic anemia a. S/p 2 unit PRBCs with improved Hb from presentation but continues to down trend again.  b. Anemia probably from infiltration of the bone marrow by his cancer which is not curable. Family does not want aggressive measures so will hold off on further transfusions, especially since the patient has improved symptoms. c. Discharge with hospice  services  2. Failure to thrive, likely secondary to progressive metastatic prostate cancer as well as anemia a. Nutritionist was consulted b. Appreciate further palliative care recommendations  3. Metastatic castrate resistant prostate cancer a. Diagnosed several years ago and castration resistant since 2018, previously on Xtandi until January 2021 when he was transition to supportive care only b. As above  4. Hypertension, stable  5. Compensated HFpEF  6. Severe protein calorie malnutrition    Consultants  . Palliative care  Procedures  . none  Antibiotics   Anti-infectives (From admission, onward)   None       Subjective  Patient seen and examined at bedside no acute distress and resting comfortably.  No events overnight.  Tolerating diet. In good spirits and anticipating discharge.   Denies any chest pain, shortness of breath, fever, nausea, vomiting, urinary or bowel complaints. Otherwise ROS negative    Objective   Discharge Exam: Vitals:   01/02/20 2027 01/03/20 0506  BP: 123/69 135/70  Pulse: 91 74  Resp: 16 16  Temp: 97.7 F (36.5 C) 97.8 F (36.6 C)  SpO2: 100% 99%   Vitals:   01/02/20 1340 01/02/20 1901 01/02/20 2027 01/03/20 0506  BP: 107/63  123/69 135/70  Pulse: 90  91 74  Resp: 18  16 16   Temp: (!) 97.5 F (36.4 C)  97.7 F (36.5 C) 97.8 F (36.6 C)  TempSrc:   Oral Oral  SpO2:   100% 99%  Weight:  75.9 kg    Height:  6' (1.829 m)      Physical Exam Vitals and nursing note reviewed.  Constitutional:  Appearance: Normal appearance.  HENT:     Head: Normocephalic and atraumatic.  Eyes:     Conjunctiva/sclera: Conjunctivae normal.  Cardiovascular:     Rate and Rhythm: Normal rate and regular rhythm.  Pulmonary:     Effort: Pulmonary effort is normal.     Breath sounds: Normal breath sounds.  Abdominal:     General: Abdomen is flat.     Palpations: Abdomen is soft.  Musculoskeletal:        General: No swelling or  tenderness.  Skin:    Coloration: Skin is not jaundiced or pale.  Neurological:     Mental Status: He is alert. Mental status is at baseline.  Psychiatric:        Mood and Affect: Mood normal.        Behavior: Behavior normal.       The results of significant diagnostics from this hospitalization (including imaging, microbiology, ancillary and laboratory) are listed below for reference.     Microbiology: Recent Results (from the past 240 hour(s))  SARS Coronavirus 2 by RT PCR (hospital order, performed in [facility] hospital lab) Nasopharyngeal Nasopharyngeal Swab     Status: None   Collection Time: 01/01/20  3:17 PM   Specimen: Nasopharyngeal Swab  Result Value Ref Range Status   SARS Coronavirus 2 NEGATIVE NEGATIVE Final    Comment: (NOTE) SARS-CoV-2 target nucleic acids are NOT DETECTED.  The SARS-CoV-2 RNA is generally detectable in upper and lower respiratory specimens during the acute phase of infection. The lowest concentration of SARS-CoV-2 viral copies this assay can detect is 250 copies / mL. A negative result does not preclude SARS-CoV-2 infection and should not be used as the sole basis for treatment or other patient management decisions.  A negative result may occur with improper specimen collection / handling, submission of specimen other than nasopharyngeal swab, presence of viral mutation(s) within the areas targeted by this assay, and inadequate number of viral copies (<250 copies / mL). A negative result must be combined with clinical observations, patient history, and epidemiological information.  Fact Sheet for Patients:   StrictlyIdeas.no  Fact Sheet for Healthcare Providers: BankingDealers.co.za  This test is not yet approved or  cleared by the [place] FDA and has been authorized for detection and/or diagnosis of SARS-CoV-2 by FDA under an Emergency Use Authorization (EUA).  This EUA will  remain in effect (meaning this test can be used) for the duration of the COVID-19 declaration under Section 564(b)(1) of the Act, 21 U.S.C. section 360bbb-3(b)(1), unless the authorization is terminated or revoked sooner.  Performed at [facility], [place] [address], [place], [place] [address]      Labs: BNP (last 3 results) No results for input(s): BNP in the last 8760 hours. Basic Metabolic Panel: Recent Labs  Lab 01/01/20 1229  NA 144  K 4.5  CL 106  CO2 22  GLUCOSE 194*  BUN 11  CREATININE 0.82  CALCIUM 8.8*   Liver Function Tests: Recent Labs  Lab 01/01/20 1229  AST 38  ALT 24  ALKPHOS 143*  BILITOT 0.7  PROT 7.0  ALBUMIN 2.8*   No results for input(s): LIPASE, AMYLASE in the last 168 hours. No results for input(s): AMMONIA in the last 168 hours. CBC: Recent Labs  Lab 01/01/20 1229 01/02/20 0039 01/03/20 0253  WBC 7.7  --  5.8  NEUTROABS 4.3  --   --   HGB 3.8* 7.8* 7.2*  HCT 13.5* 24.0* 23.1*  MCV 110.7*  --  97.1  PLT 88*  --  62*   Cardiac Enzymes: No results for input(s): CKTOTAL, CKMB, CKMBINDEX, TROPONINI in the last 168 hours. BNP: Invalid input(s): POCBNP CBG: Recent Labs  Lab 01/02/20 0241  GLUCAP 150*   D-Dimer No results for input(s): DDIMER in the last 72 hours. Hgb A1c No results for input(s): HGBA1C in the last 72 hours. Lipid Profile No results for input(s): CHOL, HDL, LDLCALC, TRIG, CHOLHDL, LDLDIRECT in the last 72 hours. Thyroid function studies No results for input(s): TSH, T4TOTAL, T3FREE, THYROIDAB in the last 72 hours.  Invalid input(s): FREET3 Anemia work up No results for input(s): VITAMINB12, FOLATE, FERRITIN, TIBC, IRON, RETICCTPCT in the last 72 hours. Urinalysis    Component Value Date/Time   COLORURINE STRAW (A) 01/05/2017 1650   APPEARANCEUR CLEAR 01/05/2017 1650   LABSPEC 1.020 03/27/2017 1327   PHURINE 5.5 03/27/2017 1327   GLUCOSEU NEGATIVE 03/27/2017 1327   HGBUR MODERATE (A)  03/27/2017 1327   BILIRUBINUR Negative 07/01/2019 1339   KETONESUR negative 12/28/2018 1406   KETONESUR NEGATIVE 03/27/2017 1327   PROTEINUR Negative 07/01/2019 1339   PROTEINUR 100 (A) 03/27/2017 1327   UROBILINOGEN 0.2 07/01/2019 1339   UROBILINOGEN 0.2 03/27/2017 1327   NITRITE Negative 07/01/2019 1339   NITRITE POSITIVE (A) 03/27/2017 1327   LEUKOCYTESUR Negative 07/01/2019 1339   Sepsis Labs Invalid input(s): PROCALCITONIN,  WBC,  LACTICIDVEN Microbiology Recent Results (from the past 240 hour(s))  SARS Coronavirus 2 by RT PCR (hospital order, performed in [place] hospital lab) Nasopharyngeal Nasopharyngeal Swab     Status: None   Collection Time: 01/01/20  3:17 PM   Specimen: Nasopharyngeal Swab  Result Value Ref Range Status   SARS Coronavirus 2 NEGATIVE NEGATIVE Final    Comment: (NOTE) SARS-CoV-2 target nucleic acids are NOT DETECTED.  The SARS-CoV-2 RNA is generally detectable in upper and lower respiratory specimens during the acute phase of infection. The lowest concentration of SARS-CoV-2 viral copies this assay can detect is 250 copies / mL. A negative result does not preclude SARS-CoV-2 infection and should not be used as the sole basis for treatment or other patient management decisions.  A negative result may occur with improper specimen collection / handling, submission of specimen other than nasopharyngeal swab, presence of viral mutation(s) within the areas targeted by this assay, and inadequate number of viral copies (<250 copies / mL). A negative result must be combined with clinical observations, patient history, and epidemiological information.  Fact Sheet for Patients:   StrictlyIdeas.no  Fact Sheet for Healthcare Providers: BankingDealers.co.za  This test is not yet approved or  cleared by the [place] FDA and has been authorized for detection and/or diagnosis of SARS-CoV-2 by FDA under an  Emergency Use Authorization (EUA).  This EUA will remain in effect (meaning this test can be used) for the duration of the COVID-19 declaration under Section 564(b)(1) of the Act, 21 U.S.C. section 360bbb-3(b)(1), unless the authorization is terminated or revoked sooner.  Performed at [facility], [place] [address] [place], [place] [address]     Discharge Instructions     Discharge Instructions    Diet - low sodium heart healthy   Complete by: As directed    Discharge instructions   Complete by: As directed    - continue to work with hospice services   Increase activity slowly   Complete by: As directed      Allergies as of 01/03/2020   No Known Allergies  Medication List    TAKE these medications   metoprolol succinate 100 MG 24 hr tablet Commonly known as: TOPROL-XL TAKE 1 TABLET (100 MG TOTAL) BY MOUTH DAILY. TAKE WITH OR IMMEDIATELY FOLLOWING A MEAL.   polyethylene glycol 17 g packet Commonly known as: MIRALAX / GLYCOLAX Take 17 g by mouth daily as needed for mild constipation.       No Known Allergies   Dispo: The patient is from: Home              Anticipated d/c is to: Home              Anticipated d/c date is: today              Patient currently is medically stable to d/c.       Time coordinating discharge: Over 30 minutes   SIGNED:   Harold Hedge, D.O. [facility] Pager: [phone]  01/03/2020, 11:36 AM

## 2020-01-03 NOTE — TOC Transition Note (Signed)
Transition of Care [facility]) - CM/SW Discharge Note   Patient Details  Name: MAJESTIC BRISTER MRN: [id number] Date of Birth: 1928/09/15  Transition of Care [facility]) CM/SW Contact:  Lia Hopping, [place] Phone Number: 01/03/2020, 12:33 PM   Clinical Narrative:    CSW spoke with Chrislyn Edison Pace with [facility]. The patient is not yet appropriate to transfer to [facility] at this time. Hospice Services will visit the patient within 24 hours of his discharge. Chrislyn has been in close contact with the patient daughter Jeanne Ivan. She is aware of the plan and ready to received the patient at home.  The patient is already receiving PT at home for safety.  CSW called for [facility] to transport the patient home.  No other needs identified.    Final next level of care: Home w Hospice Care Barriers to Discharge: No Barriers Identified   Patient Goals and CMS Choice     Choice offered to / list presented to : NA  Discharge Placement                Patient to be transferred to facility by: [facility] -under hospice services Name of family member notified: Daughter Paulette Patient and family notified of of transfer: 01/03/20  Discharge Plan and Services                DME Arranged: N/A DME Agency: NA         HH Agency: NA        Social Determinants of Health (SDOH) Interventions     Readmission Risk Interventions No flowsheet data found.

## 2020-01-05 LAB — TYPE AND SCREEN
ABO/RH(D): A POS
Antibody Screen: NEGATIVE
Unit division: 0
Unit division: 0
Unit division: 0

## 2020-01-05 LAB — BPAM RBC
Blood Product Expiration Date: 202107072359
Blood Product Expiration Date: 202107072359
Blood Product Expiration Date: 202107072359
ISSUE DATE / TIME: 202106231451
ISSUE DATE / TIME: 202106231834
Unit Type and Rh: 6200
Unit Type and Rh: 6200
Unit Type and Rh: 6200

## 2020-01-28 ENCOUNTER — Telehealth: Payer: Self-pay

## 2020-01-28 NOTE — Telephone Encounter (Signed)
-----   Message from Wyatt Portela, MD sent at 01/28/2020 10:15 AM EDT ----- Regarding: RE: Juluis Rainier He does not need any transfusion at this time.  Please arrange for monthly CBC with a type and cross starting next week for the next 3 months.  Thanks.  ----- Message ----- From: Tami Lin, RN Sent: 01/28/2020  10:01 AM EDT To: Wyatt Portela, MD Subject: FYI                                            Patient stopped care for prostate CA and went to hospice in January. Patient's daughter called and stated patient and family want to stop hospice services and resume blood transfusions as needed at the [facility]. Per daughter patient was recently admitted in June for hemoglobin of 3.8 but continues to do well and is not confused and would like to resume transfusions as needed.  Lanelle Bal

## 2020-01-28 NOTE — Telephone Encounter (Signed)
Scheduling message sent. Patient's daughter is aware to expect a call from scheduling.

## 2020-01-29 ENCOUNTER — Telehealth: Payer: Self-pay | Admitting: Oncology

## 2020-01-29 NOTE — Telephone Encounter (Signed)
Scheduled appt per 7/20 sch msg - unable to reach daughter. Left message with appt date and time

## 2020-01-31 ENCOUNTER — Other Ambulatory Visit: Payer: Self-pay | Admitting: Oncology

## 2020-01-31 DIAGNOSIS — C61 Malignant neoplasm of prostate: Secondary | ICD-10-CM

## 2020-02-03 ENCOUNTER — Other Ambulatory Visit: Payer: Self-pay

## 2020-02-03 ENCOUNTER — Inpatient Hospital Stay: Attending: Oncology

## 2020-02-03 ENCOUNTER — Inpatient Hospital Stay

## 2020-02-03 ENCOUNTER — Other Ambulatory Visit: Payer: Self-pay | Admitting: Oncology

## 2020-02-03 DIAGNOSIS — C61 Malignant neoplasm of prostate: Secondary | ICD-10-CM

## 2020-02-03 DIAGNOSIS — D649 Anemia, unspecified: Secondary | ICD-10-CM | POA: Insufficient documentation

## 2020-02-03 LAB — CBC WITH DIFFERENTIAL (CANCER CENTER ONLY)
Abs Immature Granulocytes: 0.07 10*3/uL (ref 0.00–0.07)
Basophils Absolute: 0 10*3/uL (ref 0.0–0.1)
Basophils Relative: 0 %
Eosinophils Absolute: 0.1 10*3/uL (ref 0.0–0.5)
Eosinophils Relative: 1 %
HCT: 19.1 % — ABNORMAL LOW (ref 39.0–52.0)
Hemoglobin: 5.7 g/dL — CL (ref 13.0–17.0)
Immature Granulocytes: 1 %
Lymphocytes Relative: 33 %
Lymphs Abs: 3.2 10*3/uL (ref 0.7–4.0)
MCH: 30.5 pg (ref 26.0–34.0)
MCHC: 29.8 g/dL — ABNORMAL LOW (ref 30.0–36.0)
MCV: 102.1 fL — ABNORMAL HIGH (ref 80.0–100.0)
Monocytes Absolute: 0.4 10*3/uL (ref 0.1–1.0)
Monocytes Relative: 4 %
Neutro Abs: 5.8 10*3/uL (ref 1.7–7.7)
Neutrophils Relative %: 61 %
Platelet Count: 136 10*3/uL — ABNORMAL LOW (ref 150–400)
RBC: 1.87 MIL/uL — ABNORMAL LOW (ref 4.22–5.81)
RDW: 19.4 % — ABNORMAL HIGH (ref 11.5–15.5)
WBC Count: 9.5 10*3/uL (ref 4.0–10.5)
nRBC: 1.4 % — ABNORMAL HIGH (ref 0.0–0.2)

## 2020-02-03 LAB — PREPARE RBC (CROSSMATCH)

## 2020-02-03 LAB — SAMPLE TO BLOOD BANK

## 2020-02-03 MED ORDER — DIPHENHYDRAMINE HCL 25 MG PO CAPS
25.0000 mg | ORAL_CAPSULE | Freq: Once | ORAL | Status: AC
  Administered 2020-02-03: 25 mg via ORAL

## 2020-02-03 MED ORDER — ACETAMINOPHEN 325 MG PO TABS
ORAL_TABLET | ORAL | Status: AC
  Filled 2020-02-03: qty 2

## 2020-02-03 MED ORDER — ACETAMINOPHEN 325 MG PO TABS
650.0000 mg | ORAL_TABLET | Freq: Once | ORAL | Status: AC
  Administered 2020-02-03: 650 mg via ORAL

## 2020-02-03 MED ORDER — SODIUM CHLORIDE 0.9% IV SOLUTION
250.0000 mL | Freq: Once | INTRAVENOUS | Status: AC
  Administered 2020-02-03: 250 mL via INTRAVENOUS
  Filled 2020-02-03: qty 250

## 2020-02-03 MED ORDER — DIPHENHYDRAMINE HCL 25 MG PO CAPS
ORAL_CAPSULE | ORAL | Status: AC
  Filled 2020-02-03: qty 1

## 2020-02-03 NOTE — Patient Instructions (Signed)
Blood Transfusion, Adult, Care After This sheet gives you information about how to care for yourself after your procedure. Your doctor may also give you more specific instructions. If you have problems or questions, contact your doctor. What can I expect after the procedure? After the procedure, it is common to have:  Bruising and soreness at the IV site.  A fever or chills on the day of the procedure. This may be your body's response to the new blood cells received.  A headache. Follow these instructions at home: Insertion site care      Follow instructions from your doctor about how to take care of your insertion site. This is where an IV tube was put into your vein. Make sure you: ? Wash your hands with soap and water before and after you change your bandage (dressing). If you cannot use soap and water, use hand sanitizer. ? Change your bandage as told by your doctor.  Check your insertion site every day for signs of infection. Check for: ? Redness, swelling, or pain. ? Bleeding from the site. ? Warmth. ? Pus or a bad smell. General instructions  Take over-the-counter and prescription medicines only as told by your doctor.  Rest as told by your doctor.  Go back to your normal activities as told by your doctor.  Keep all follow-up visits as told by your doctor. This is important. Contact a doctor if:  You have itching or red, swollen areas of skin (hives).  You feel worried or nervous (anxious).  You feel weak after doing your normal activities.  You have redness, swelling, warmth, or pain around the insertion site.  You have blood coming from the insertion site, and the blood does not stop with pressure.  You have pus or a bad smell coming from the insertion site. Get help right away if:  You have signs of a serious reaction. This may be coming from an allergy or the body's defense system (immune system). Signs include: ? Trouble breathing or shortness of  breath. ? Swelling of the face or feeling warm (flushed). ? Fever or chills. ? Head, chest, or back pain. ? Dark pee (urine) or blood in the pee. ? Widespread rash. ? Fast heartbeat. ? Feeling dizzy or light-headed. You may receive your blood transfusion in an outpatient setting. If so, you will be told whom to contact to report any reactions. These symptoms may be an emergency. Do not wait to see if the symptoms will go away. Get medical help right away. Call your local emergency services (911 in the [place] Do not drive yourself to the hospital. Summary  Bruising and soreness at the IV site are common.  Check your insertion site every day for signs of infection.  Rest as told by your doctor. Go back to your normal activities as told by your doctor.  Get help right away if you have signs of a serious reaction. This information is not intended to replace advice given to you by your health care provider. Make sure you discuss any questions you have with your health care provider. Document Revised: 12/20/2018 Document Reviewed: 12/20/2018 Elsevier Patient Education  2020 Elsevier Inc.     Blood Transfusion, Adult A blood transfusion is a procedure in which you receive blood or a type of blood cell (blood component) through an IV. You may need a blood transfusion when your blood level is low. This may result from a bleeding disorder, illness, injury, or surgery. The blood may come   from a donor. You may also be able to donate blood for yourself (autologous blood donation) before a planned surgery. The blood given in a transfusion is made up of different blood components. You may receive:  Red blood cells. These carry oxygen to the cells in the body.  Platelets. These help your blood to clot.  Plasma. This is the liquid part of your blood. It carries proteins and other substances throughout the body.  White blood cells. These help you fight infections. If you have hemophilia or  another clotting disorder, you may also receive other types of blood products. Tell a health care provider about:  Any blood disorders you have.  Any previous reactions you have had during a blood transfusion.  Any allergies you have.  All medicines you are taking, including vitamins, herbs, eye drops, creams, and over-the-counter medicines.  Any surgeries you have had.  Any medical conditions you have, including any recent fever or cold symptoms.  Whether you are pregnant or may be pregnant. What are the risks? Generally, this is a safe procedure. However, problems may occur.  The most common problems include: ? A mild allergic reaction, such as red, swollen areas of skin (hives) and itching. ? Fever or chills. This may be the body's response to new blood cells received. This may occur during or up to 4 hours after the transfusion.  More serious problems may include: ? Transfusion-associated circulatory overload (TACO), or too much fluid in the lungs. This may cause breathing problems. ? A serious allergic reaction, such as difficulty breathing or swelling around the face and lips. ? Transfusion-related acute lung injury (TRALI), which causes breathing difficulty and low oxygen in the blood. This can occur within hours of the transfusion or several days later. ? Iron overload. This can happen after receiving many blood transfusions over a period of time. ? Infection or virus being transmitted. This is rare because donated blood is carefully tested before it is given. ? Hemolytic transfusion reaction. This is rare. It happens when your body's defense system (immune system)tries to attack the new blood cells. Symptoms may include fever, chills, nausea, low blood pressure, and low back or chest pain. ? Transfusion-associated graft-versus-host disease (TAGVHD). This is rare. It happens when donated cells attack your body's healthy tissues. What happens before the  procedure? Medicines Ask your health care provider about:  Changing or stopping your regular medicines. This is especially important if you are taking diabetes medicines or blood thinners.  Taking medicines such as aspirin and ibuprofen. These medicines can thin your blood. Do not take these medicines unless your health care provider tells you to take them.  Taking over-the-counter medicines, vitamins, herbs, and supplements. General instructions  Follow instructions from your health care provider about eating and drinking restrictions.  You will have a blood test to determine your blood type. This is necessary to know what kind of blood your body will accept and to match it to the donor blood.  If you are going to have a planned surgery, you may be able to do an autologous blood donation. This may be done in case you need to have a transfusion.  You will have your temperature, blood pressure, and pulse monitored before the transfusion.  If you have had an allergic reaction to a transfusion in the past, you may be given medicine to help prevent a reaction. This medicine may be given to you by mouth (orally) or through an IV.  Set aside time for   the blood transfusion. This procedure generally takes 1-4 hours to complete. What happens during the procedure?   An IV will be inserted into one of your veins.  The bag of donated blood will be attached to your IV. The blood will then enter through your vein.  Your temperature, blood pressure, and pulse will be monitored regularly during the transfusion. This monitoring is done to detect early signs of a transfusion reaction.  Tell your nurse right away if you have any of these symptoms during the transfusion: ? Shortness of breath or trouble breathing. ? Chest or back pain. ? Fever or chills. ? Hives or itching.  If you have any signs or symptoms of a reaction, your transfusion will be stopped and you may be given medicine.  When the  transfusion is complete, your IV will be removed.  Pressure may be applied to the IV site for a few minutes.  A bandage (dressing)will be applied. The procedure may vary among health care providers and hospitals. What happens after the procedure?  Your temperature, blood pressure, pulse, breathing rate, and blood oxygen level will be monitored until you leave the hospital or clinic.  Your blood may be tested to see how you are responding to the transfusion.  You may be warmed with fluids or blankets to maintain a normal body temperature.  If you receive your blood transfusion in an outpatient setting, you will be told whom to contact to report any reactions. Where to find more information For more information on blood transfusions, visit the American Red Cross: redcross.org Summary  A blood transfusion is a procedure in which you receive blood or a type of blood cell (blood component) through an IV.  The blood you receive may come from a donor or be donated by yourself (autologous blood donation) before a planned surgery.  The blood given in a transfusion is made up of different blood components. You may receive red blood cells, platelets, plasma, or white blood cells depending on the condition treated.  Your temperature, blood pressure, and pulse will be monitored before, during, and after the transfusion.  After the transfusion, your blood may be tested to see how your body has responded. This information is not intended to replace advice given to you by your health care provider. Make sure you discuss any questions you have with your health care provider. Document Revised: 12/20/2018 Document Reviewed: 12/20/2018 Elsevier Patient Education  2020 Elsevier Inc.   

## 2020-02-04 LAB — TYPE AND SCREEN
ABO/RH(D): A POS
Antibody Screen: NEGATIVE
Unit division: 0
Unit division: 0

## 2020-02-04 LAB — BPAM RBC
Blood Product Expiration Date: 202108172359
Blood Product Expiration Date: 202108172359
ISSUE DATE / TIME: 202107261348
ISSUE DATE / TIME: 202107261348
Unit Type and Rh: 6200
Unit Type and Rh: 6200

## 2020-02-04 LAB — PROSTATE-SPECIFIC AG, SERUM (LABCORP): Prostate Specific Ag, Serum: 1058 ng/mL — ABNORMAL HIGH (ref 0.0–4.0)

## 2020-03-02 ENCOUNTER — Telehealth (INDEPENDENT_AMBULATORY_CARE_PROVIDER_SITE_OTHER): Payer: Medicare Other | Admitting: Family Medicine

## 2020-03-02 ENCOUNTER — Other Ambulatory Visit: Payer: Self-pay

## 2020-03-02 DIAGNOSIS — D649 Anemia, unspecified: Secondary | ICD-10-CM | POA: Diagnosis not present

## 2020-03-02 DIAGNOSIS — Z515 Encounter for palliative care: Secondary | ICD-10-CM

## 2020-03-02 DIAGNOSIS — C61 Malignant neoplasm of prostate: Secondary | ICD-10-CM | POA: Diagnosis not present

## 2020-03-02 DIAGNOSIS — Z09 Encounter for follow-up examination after completed treatment for conditions other than malignant neoplasm: Secondary | ICD-10-CM

## 2020-03-02 DIAGNOSIS — I10 Essential (primary) hypertension: Secondary | ICD-10-CM | POA: Diagnosis not present

## 2020-03-02 NOTE — Progress Notes (Signed)
Virtual Visit via Telephone Note  I connected with Gregory [name] on 03/02/20 at  1:00 PM EDT by telephone and verified that I am speaking with the correct person using two identifiers.   I discussed the limitations, risks, security and privacy concerns of performing an evaluation and management service by telephone and the availability of in person appointments. I also discussed with the patient that there may be a patient responsible charge related to this service. The patient expressed understanding and agreed to proceed.   Televisit Today Patient Location: Home Provider Location: Office  History of Present Illness:  Social History   Socioeconomic History  . Marital status: Divorced    Spouse name: Not on file  . Number of children: Not on file  . Years of education: Not on file  . Highest education level: Not on file  Occupational History  . Occupation: Retired  Tobacco Use  . Smoking status: Former Smoker    Packs/day: 0.25    Years: 30.00    Pack years: 7.50    Types: Cigarettes    Start date: 07/11/1992  . Smokeless tobacco: Never Used  Vaping Use  . Vaping Use: Never used  Substance and Sexual Activity  . Alcohol use: No    Alcohol/week: 0.0 standard drinks  . Drug use: No  . Sexual activity: Never  Other Topics Concern  . Not on file  Social History Narrative  . Not on file   Social Determinants of Health   Financial Resource Strain:   . Difficulty of Paying Living Expenses: Not on file  Food Insecurity:   . Worried About Charity fundraiser in the Last Year: Not on file  . Ran Out of Food in the Last Year: Not on file  Transportation Needs:   . Lack of Transportation (Medical): Not on file  . Lack of Transportation (Non-Medical): Not on file  Physical Activity:   . Days of Exercise per Week: Not on file  . Minutes of Exercise per Session: Not on file  Stress:   . Feeling of Stress : Not on file  Social Connections:   . Frequency of Communication  with Friends and Family: Not on file  . Frequency of Social Gatherings with Friends and Family: Not on file  . Attends Religious Services: Not on file  . Active Member of Clubs or Organizations: Not on file  . Attends Archivist Meetings: Not on file  . Marital Status: Not on file  Intimate Partner Violence:   . Fear of Current or Ex-Partner: Not on file  . Emotionally Abused: Not on file  . Physically Abused: Not on file  . Sexually Abused: Not on file    Past Surgical History:  Procedure Laterality Date  . MULTIPLE TOOTH EXTRACTIONS  1980's  . ORCHIECTOMY Bilateral 08/27/2015   Procedure: ORCHIECTOMY;  Surgeon: Franchot Gallo, MD;  [facility];  Service: Urology;  Laterality: Bilateral;  . PROSTATE BIOPSY  1/16 /2017  . TRANSURETHRAL RESECTION OF PROSTATE N/A 08/27/2015   Procedure: TRANSURETHRAL RESECTION OF THE PROSTATE WITH GYRUS INSTRUMENTS;  Surgeon: Franchot Gallo, MD;  Location: [facility];  Service: Urology;  Laterality: N/A;    Family History  Problem Relation Age of Onset  . Tuberculosis Mother 68  . Other Father 79    Past Medical History:  Diagnosis Date  . Bone metastases ([place])   . BPH (benign prostatic hyperplasia)   . Death of wife 76  .  Foley catheter in place    Urinary Retension  . GERD (gastroesophageal reflux disease)   . Hypertension   . Prostate cancer [facility])   urologist- dr dahlstedt/  oncologist-  dr Tammi Klippel    Gleason 4+5,  PSA 18.74,  w/  Visceral and Extensive Bones METS--  pallitive radiation therapy and hormone therapy    No Known Allergies   Outpatient Encounter Medications as of 03/02/2020  Medication Sig Note  . metoprolol succinate (TOPROL-XL) 100 MG 24 hr tablet TAKE 1 TABLET (100 MG TOTAL) BY MOUTH DAILY. TAKE WITH OR IMMEDIATELY FOLLOWING A MEAL. 01/01/2020: LF 10/2019 #180  . polyethylene glycol (MIRALAX / GLYCOLAX) packet Take 17 g by mouth daily as needed for mild constipation.  06/27/2018: As needed  . tamsulosin (FLOMAX) 0.4 MG CAPS capsule Take 0.4 mg by mouth daily. (Patient not taking: Reported on 03/02/2020)    No facility-administered encounter medications on file as of 03/02/2020.    Current Status: Since his last office visit, he is doing well with no complaints. He is need of assistance with ADLs. His daughter and family members all are currently working and does not have time to take care of patient. Spoke with daughter today.  Patient has been with Hospice since 08/2019. Patient is currently following up with [facility] for monthly blood infusions, for symptomatic anemia, r/t his diagnosis of Prostate Cancer with Bone Mets diagnosed in 2017. He denies fevers, chills, fatigue, recent infections, weight loss, and night sweats. He has not had any headaches, visual changes, dizziness, and falls. No chest pain, heart palpitations, cough and shortness of breath reported. Denies GI problems such as nausea, vomiting, diarrhea, and constipation. He has no reports of blood in stools, dysuria and hematuria. No depression or anxiety, and denies suicidal ideations, homicidal ideations, or auditory hallucinations. He is taking all medications as prescribed. He denies pain today.   Observations/Objective:  Telephone Visit   Assessment and Plan:  1. Malignant neoplasm prostate ([place])  2. Prostate cancer ([place])  3. Hospice care patient  4. Symptomatic anemia  5. Essential hypertension He will continue to take medications as prescribed, to decrease high sodium intake, excessive alcohol intake, increase potassium intake, smoking cessation, and increase physical activity of at least 30 minutes of cardio activity daily. He will continue to follow Heart Healthy or DASH diet.  6. Follow up He will follow up as needed.   No orders of the defined types were placed in this encounter.   No orders of the defined types were placed in this encounter.  Referral Orders  No referral(s)  requested today    Kathe Becton,  MSN, FNP-BC [facility] [address] [place], [place] [address] [phone] fax  I discussed the assessment and treatment plan with the patient. The patient was provided an opportunity to ask questions and all were answered. The patient agreed with the plan and demonstrated an understanding of the instructions.   The patient was advised to call back or seek an in-person evaluation if the symptoms worsen or if the condition fails to improve as anticipated.  I provided 20 minutes of non-face-to-face time during this encounter.   [name], FNP

## 2020-03-05 ENCOUNTER — Inpatient Hospital Stay: Attending: Oncology

## 2020-03-05 ENCOUNTER — Inpatient Hospital Stay

## 2020-03-05 ENCOUNTER — Other Ambulatory Visit: Payer: Self-pay

## 2020-03-05 VITALS — BP 121/66 | HR 82 | Temp 97.8°F | Resp 18

## 2020-03-05 DIAGNOSIS — C61 Malignant neoplasm of prostate: Secondary | ICD-10-CM

## 2020-03-05 DIAGNOSIS — D649 Anemia, unspecified: Secondary | ICD-10-CM | POA: Diagnosis not present

## 2020-03-05 DIAGNOSIS — C7951 Secondary malignant neoplasm of bone: Secondary | ICD-10-CM

## 2020-03-05 LAB — CBC WITH DIFFERENTIAL (CANCER CENTER ONLY)
Abs Immature Granulocytes: 0.08 10*3/uL — ABNORMAL HIGH (ref 0.00–0.07)
Basophils Absolute: 0 10*3/uL (ref 0.0–0.1)
Basophils Relative: 0 %
Eosinophils Absolute: 0 10*3/uL (ref 0.0–0.5)
Eosinophils Relative: 0 %
HCT: 19.4 % — ABNORMAL LOW (ref 39.0–52.0)
Hemoglobin: 5.9 g/dL — CL (ref 13.0–17.0)
Immature Granulocytes: 1 %
Lymphocytes Relative: 31 %
Lymphs Abs: 3.3 10*3/uL (ref 0.7–4.0)
MCH: 29.8 pg (ref 26.0–34.0)
MCHC: 30.4 g/dL (ref 30.0–36.0)
MCV: 98 fL (ref 80.0–100.0)
Monocytes Absolute: 0.6 10*3/uL (ref 0.1–1.0)
Monocytes Relative: 6 %
Neutro Abs: 6.6 10*3/uL (ref 1.7–7.7)
Neutrophils Relative %: 62 %
Platelet Count: 162 10*3/uL (ref 150–400)
RBC: 1.98 MIL/uL — ABNORMAL LOW (ref 4.22–5.81)
RDW: 20.9 % — ABNORMAL HIGH (ref 11.5–15.5)
WBC Count: 10.6 10*3/uL — ABNORMAL HIGH (ref 4.0–10.5)
nRBC: 0.8 % — ABNORMAL HIGH (ref 0.0–0.2)

## 2020-03-05 LAB — SAMPLE TO BLOOD BANK

## 2020-03-05 LAB — PREPARE RBC (CROSSMATCH)

## 2020-03-05 MED ORDER — SODIUM CHLORIDE 0.9% IV SOLUTION
250.0000 mL | Freq: Once | INTRAVENOUS | Status: DC
  Filled 2020-03-05: qty 250

## 2020-03-05 MED ORDER — ACETAMINOPHEN 325 MG PO TABS
ORAL_TABLET | ORAL | Status: AC
  Filled 2020-03-05: qty 2

## 2020-03-05 MED ORDER — DIPHENHYDRAMINE HCL 25 MG PO CAPS
25.0000 mg | ORAL_CAPSULE | Freq: Once | ORAL | Status: AC
  Administered 2020-03-05: 25 mg via ORAL

## 2020-03-05 MED ORDER — ACETAMINOPHEN 325 MG PO TABS
650.0000 mg | ORAL_TABLET | Freq: Once | ORAL | Status: AC
  Administered 2020-03-05: 650 mg via ORAL

## 2020-03-05 MED ORDER — DIPHENHYDRAMINE HCL 25 MG PO CAPS
ORAL_CAPSULE | ORAL | Status: AC
  Filled 2020-03-05: qty 1

## 2020-03-05 NOTE — Patient Instructions (Signed)

## 2020-03-06 LAB — PROSTATE-SPECIFIC AG, SERUM (LABCORP): Prostate Specific Ag, Serum: 1047 ng/mL — ABNORMAL HIGH (ref 0.0–4.0)

## 2020-03-06 LAB — BPAM RBC
Blood Product Expiration Date: 202109152359
Blood Product Expiration Date: 202109162359
ISSUE DATE / TIME: 202108261342
ISSUE DATE / TIME: 202108261606
Unit Type and Rh: 6200
Unit Type and Rh: 6200

## 2020-03-06 LAB — TYPE AND SCREEN
ABO/RH(D): A POS
Antibody Screen: NEGATIVE
Unit division: 0
Unit division: 0

## 2020-03-12 ENCOUNTER — Telehealth: Payer: Self-pay

## 2020-03-12 NOTE — Telephone Encounter (Signed)
Rickey Barbara RN from [facility] called stating patient is revoking his hospice benefits today 03/12/20 at [place]. Dr Alen Blew made aware.

## 2020-03-31 ENCOUNTER — Telehealth: Payer: Self-pay

## 2020-03-31 NOTE — Telephone Encounter (Signed)
-----   Message from Rolland Bimler, RN sent at 03/27/2020  5:56 PM EDT ----- Called daughter on Friday after note left on desk with CB request.When asked about the nature of the call, Ms. Garcon stated she thought she was calling the PCP as she had spoken with their office earlier in week. Advised that Dr. Alen Blew was oncologist - Ms. Sivley stated that she will call PCP and thanked caller for returning call.  Sandi  ----- Message ----- From: Kennedy Bucker, LPN Sent: 1/42/3953   3:31 PM EDT To: Arlice Colt Pod 5   ----- Message ----- From: Wyatt Portela, MD Sent: 03/17/2020   4:14 PM EDT To: Kennedy Bucker, LPN  Ok to arrange for oxygen.  I recommend they call hospice again.  He is better off under their care.  Thanks ----- Message ----- From: Kennedy Bucker, LPN Sent: 2/0/2334   3:30 PM EDT To: Wyatt Portela, MD, [facility]  Patient's daughter called about patient needing a referral for oxygen at home. Patient revoked hospice benefits last week. Daughter states that [facility] called this morning stating they had to come pick up the oxygen tank and shower care since he was no longer under their care. Daughter states not having the shower chair is fine but he does need the oxygen. Daughter states that the patient is immobile. Please advise. Thank you.   Maudie Mercury

## 2020-04-06 ENCOUNTER — Inpatient Hospital Stay: Payer: Medicare Other | Attending: Oncology

## 2020-04-06 ENCOUNTER — Inpatient Hospital Stay: Payer: Medicare Other

## 2020-04-06 ENCOUNTER — Other Ambulatory Visit: Payer: Self-pay

## 2020-04-06 DIAGNOSIS — C7951 Secondary malignant neoplasm of bone: Secondary | ICD-10-CM | POA: Insufficient documentation

## 2020-04-06 DIAGNOSIS — C61 Malignant neoplasm of prostate: Secondary | ICD-10-CM | POA: Insufficient documentation

## 2020-04-06 DIAGNOSIS — D649 Anemia, unspecified: Secondary | ICD-10-CM | POA: Insufficient documentation

## 2020-04-06 LAB — CBC WITH DIFFERENTIAL (CANCER CENTER ONLY)
Abs Immature Granulocytes: 0.04 10*3/uL (ref 0.00–0.07)
Basophils Absolute: 0 10*3/uL (ref 0.0–0.1)
Basophils Relative: 0 %
Eosinophils Absolute: 0 10*3/uL (ref 0.0–0.5)
Eosinophils Relative: 0 %
HCT: 24.6 % — ABNORMAL LOW (ref 39.0–52.0)
Hemoglobin: 7.5 g/dL — ABNORMAL LOW (ref 13.0–17.0)
Immature Granulocytes: 1 %
Lymphocytes Relative: 20 %
Lymphs Abs: 1.7 10*3/uL (ref 0.7–4.0)
MCH: 30.6 pg (ref 26.0–34.0)
MCHC: 30.5 g/dL (ref 30.0–36.0)
MCV: 100.4 fL — ABNORMAL HIGH (ref 80.0–100.0)
Monocytes Absolute: 0.5 10*3/uL (ref 0.1–1.0)
Monocytes Relative: 6 %
Neutro Abs: 6.2 10*3/uL (ref 1.7–7.7)
Neutrophils Relative %: 73 %
Platelet Count: 175 10*3/uL (ref 150–400)
RBC: 2.45 MIL/uL — ABNORMAL LOW (ref 4.22–5.81)
RDW: 20.2 % — ABNORMAL HIGH (ref 11.5–15.5)
WBC Count: 8.5 10*3/uL (ref 4.0–10.5)
nRBC: 0.7 % — ABNORMAL HIGH (ref 0.0–0.2)

## 2020-04-06 LAB — SAMPLE TO BLOOD BANK

## 2020-04-06 LAB — PREPARE RBC (CROSSMATCH)

## 2020-04-06 MED ORDER — DIPHENHYDRAMINE HCL 25 MG PO CAPS
ORAL_CAPSULE | ORAL | Status: AC
  Filled 2020-04-06: qty 2

## 2020-04-06 MED ORDER — DIPHENHYDRAMINE HCL 25 MG PO CAPS
25.0000 mg | ORAL_CAPSULE | Freq: Once | ORAL | Status: AC
  Administered 2020-04-06: 25 mg via ORAL

## 2020-04-06 MED ORDER — ACETAMINOPHEN 325 MG PO TABS
ORAL_TABLET | ORAL | Status: AC
  Filled 2020-04-06: qty 2

## 2020-04-06 MED ORDER — ACETAMINOPHEN 325 MG PO TABS
650.0000 mg | ORAL_TABLET | Freq: Once | ORAL | Status: AC
  Administered 2020-04-06: 650 mg via ORAL

## 2020-04-06 NOTE — Patient Instructions (Signed)

## 2020-04-07 LAB — PROSTATE-SPECIFIC AG, SERUM (LABCORP): Prostate Specific Ag, Serum: 1283 ng/mL — ABNORMAL HIGH (ref 0.0–4.0)

## 2020-04-07 LAB — BPAM RBC
Blood Product Expiration Date: 202110142359
ISSUE DATE / TIME: 202109271334
Unit Type and Rh: 6200

## 2020-04-07 LAB — TYPE AND SCREEN
ABO/RH(D): A POS
Antibody Screen: NEGATIVE
Unit division: 0

## 2020-05-06 ENCOUNTER — Inpatient Hospital Stay: Payer: Medicare Other

## 2020-05-06 ENCOUNTER — Other Ambulatory Visit: Payer: Self-pay

## 2020-05-06 ENCOUNTER — Inpatient Hospital Stay: Payer: Medicare Other | Attending: Oncology

## 2020-05-06 DIAGNOSIS — Z23 Encounter for immunization: Secondary | ICD-10-CM

## 2020-05-06 DIAGNOSIS — C61 Malignant neoplasm of prostate: Secondary | ICD-10-CM | POA: Insufficient documentation

## 2020-05-06 DIAGNOSIS — D649 Anemia, unspecified: Secondary | ICD-10-CM | POA: Diagnosis not present

## 2020-05-06 LAB — CBC WITH DIFFERENTIAL (CANCER CENTER ONLY)
Abs Immature Granulocytes: 0.05 10*3/uL (ref 0.00–0.07)
Basophils Absolute: 0 10*3/uL (ref 0.0–0.1)
Basophils Relative: 0 %
Eosinophils Absolute: 0.1 10*3/uL (ref 0.0–0.5)
Eosinophils Relative: 1 %
HCT: 24.9 % — ABNORMAL LOW (ref 39.0–52.0)
Hemoglobin: 7.5 g/dL — ABNORMAL LOW (ref 13.0–17.0)
Immature Granulocytes: 1 %
Lymphocytes Relative: 28 %
Lymphs Abs: 3.1 10*3/uL (ref 0.7–4.0)
MCH: 30.7 pg (ref 26.0–34.0)
MCHC: 30.1 g/dL (ref 30.0–36.0)
MCV: 102 fL — ABNORMAL HIGH (ref 80.0–100.0)
Monocytes Absolute: 0.5 10*3/uL (ref 0.1–1.0)
Monocytes Relative: 5 %
Neutro Abs: 7.3 10*3/uL (ref 1.7–7.7)
Neutrophils Relative %: 65 %
Platelet Count: 195 10*3/uL (ref 150–400)
RBC: 2.44 MIL/uL — ABNORMAL LOW (ref 4.22–5.81)
RDW: 19.2 % — ABNORMAL HIGH (ref 11.5–15.5)
WBC Count: 11 10*3/uL — ABNORMAL HIGH (ref 4.0–10.5)
nRBC: 0.9 % — ABNORMAL HIGH (ref 0.0–0.2)

## 2020-05-06 LAB — SAMPLE TO BLOOD BANK

## 2020-05-06 LAB — PREPARE RBC (CROSSMATCH)

## 2020-05-06 MED ORDER — ACETAMINOPHEN 325 MG PO TABS
650.0000 mg | ORAL_TABLET | Freq: Once | ORAL | Status: AC
  Administered 2020-05-06: 650 mg via ORAL

## 2020-05-06 MED ORDER — SODIUM CHLORIDE 0.9% IV SOLUTION
250.0000 mL | Freq: Once | INTRAVENOUS | Status: DC
  Filled 2020-05-06: qty 250

## 2020-05-06 MED ORDER — DIPHENHYDRAMINE HCL 25 MG PO CAPS
ORAL_CAPSULE | ORAL | Status: AC
  Filled 2020-05-06: qty 1

## 2020-05-06 MED ORDER — DIPHENHYDRAMINE HCL 25 MG PO CAPS
25.0000 mg | ORAL_CAPSULE | Freq: Once | ORAL | Status: AC
  Administered 2020-05-06: 25 mg via ORAL

## 2020-05-06 MED ORDER — ACETAMINOPHEN 325 MG PO TABS
ORAL_TABLET | ORAL | Status: AC
  Filled 2020-05-06: qty 2

## 2020-05-06 NOTE — Patient Instructions (Signed)

## 2020-05-06 NOTE — Progress Notes (Signed)
   Covid-19 Vaccination Clinic  Name:  Gregory [name]    MRN: [id number] DOB: April 20, 1929  05/06/2020  [name] was observed post Covid-19 immunization for 15 minutes without incident. He was provided with Vaccine Information Sheet and instruction to access the V-Safe system.   [name] was instructed to call 911 with any severe reactions post vaccine: Marland Kitchen Difficulty breathing  . Swelling of face and throat  . A fast heartbeat  . A bad rash all over body  . Dizziness and weakness

## 2020-05-07 ENCOUNTER — Encounter: Payer: Self-pay | Admitting: Family Medicine

## 2020-05-07 LAB — TYPE AND SCREEN
ABO/RH(D): A POS
Antibody Screen: NEGATIVE
Unit division: 0
Unit division: 0

## 2020-05-07 LAB — BPAM RBC
Blood Product Expiration Date: 202111182359
Blood Product Expiration Date: 202111182359
ISSUE DATE / TIME: 202110271218
ISSUE DATE / TIME: 202110271218
Unit Type and Rh: 6200
Unit Type and Rh: 6200

## 2020-05-07 LAB — PROSTATE-SPECIFIC AG, SERUM (LABCORP): Prostate Specific Ag, Serum: 1247 ng/mL — ABNORMAL HIGH (ref 0.0–4.0)

## 2020-05-25 ENCOUNTER — Telehealth: Payer: Self-pay | Admitting: *Deleted

## 2020-05-26 NOTE — Telephone Encounter (Signed)
error 

## 2020-05-27 ENCOUNTER — Telehealth: Payer: Self-pay | Admitting: Oncology

## 2020-05-27 NOTE — Telephone Encounter (Signed)
Scheduled appt per 11/15 sch msg - pt daughter aware of apts on 11/19 and 11/20

## 2020-05-29 ENCOUNTER — Inpatient Hospital Stay: Payer: Medicare Other | Attending: Oncology

## 2020-05-29 ENCOUNTER — Inpatient Hospital Stay (HOSPITAL_BASED_OUTPATIENT_CLINIC_OR_DEPARTMENT_OTHER): Payer: Medicare Other | Admitting: Medical

## 2020-05-29 ENCOUNTER — Other Ambulatory Visit: Payer: Self-pay

## 2020-05-29 DIAGNOSIS — C61 Malignant neoplasm of prostate: Secondary | ICD-10-CM | POA: Diagnosis not present

## 2020-05-29 DIAGNOSIS — R21 Rash and other nonspecific skin eruption: Secondary | ICD-10-CM

## 2020-05-29 LAB — CBC WITH DIFFERENTIAL (CANCER CENTER ONLY)
Abs Immature Granulocytes: 0.02 10*3/uL (ref 0.00–0.07)
Basophils Absolute: 0 10*3/uL (ref 0.0–0.1)
Basophils Relative: 0 %
Eosinophils Absolute: 0.1 10*3/uL (ref 0.0–0.5)
Eosinophils Relative: 1 %
HCT: 28.6 % — ABNORMAL LOW (ref 39.0–52.0)
Hemoglobin: 8.9 g/dL — ABNORMAL LOW (ref 13.0–17.0)
Immature Granulocytes: 0 %
Lymphocytes Relative: 24 %
Lymphs Abs: 2.3 10*3/uL (ref 0.7–4.0)
MCH: 30.6 pg (ref 26.0–34.0)
MCHC: 31.1 g/dL (ref 30.0–36.0)
MCV: 98.3 fL (ref 80.0–100.0)
Monocytes Absolute: 0.6 10*3/uL (ref 0.1–1.0)
Monocytes Relative: 6 %
Neutro Abs: 6.6 10*3/uL (ref 1.7–7.7)
Neutrophils Relative %: 69 %
Platelet Count: 169 10*3/uL (ref 150–400)
RBC: 2.91 MIL/uL — ABNORMAL LOW (ref 4.22–5.81)
RDW: 18.6 % — ABNORMAL HIGH (ref 11.5–15.5)
WBC Count: 9.7 10*3/uL (ref 4.0–10.5)
nRBC: 0.2 % (ref 0.0–0.2)

## 2020-05-29 LAB — SAMPLE TO BLOOD BANK

## 2020-05-29 MED ORDER — TRIAMCINOLONE ACETONIDE 0.1 % EX LOTN: 1.0000 "application " | TOPICAL_LOTION | Freq: Three times a day (TID) | CUTANEOUS | 1 refills | Status: AC

## 2020-05-30 ENCOUNTER — Inpatient Hospital Stay: Payer: Medicare Other

## 2020-05-30 LAB — PROSTATE-SPECIFIC AG, SERUM (LABCORP): Prostate Specific Ag, Serum: 1460 ng/mL — ABNORMAL HIGH (ref 0.0–4.0)

## 2020-06-02 NOTE — Progress Notes (Signed)
Mr. Seddon presented as a walk-in patient today.  He reported that he was having a diffuse rash over his bilateral upper extremities and chest.  He was given a prescription for triamcinolone lotion.  He was told not to use this on his face or in his genital region.  He expresses understanding and agreement with this plan.  Sandi Mealy, MHS, PA-C Physician Assistant

## 2020-06-09 ENCOUNTER — Telehealth: Payer: Self-pay | Admitting: Oncology

## 2020-06-09 NOTE — Telephone Encounter (Signed)
Scheduled appt per 11/19 sch msg - pt wife is aware of appts.

## 2020-06-23 ENCOUNTER — Other Ambulatory Visit: Payer: Self-pay | Admitting: Emergency Medicine

## 2020-06-23 ENCOUNTER — Other Ambulatory Visit: Payer: Self-pay

## 2020-06-23 ENCOUNTER — Other Ambulatory Visit: Payer: Self-pay | Admitting: Oncology

## 2020-06-23 ENCOUNTER — Inpatient Hospital Stay: Payer: Medicare Other

## 2020-06-23 ENCOUNTER — Inpatient Hospital Stay: Payer: Medicare Other | Attending: Oncology

## 2020-06-23 DIAGNOSIS — D649 Anemia, unspecified: Secondary | ICD-10-CM | POA: Diagnosis present

## 2020-06-23 DIAGNOSIS — C7951 Secondary malignant neoplasm of bone: Secondary | ICD-10-CM

## 2020-06-23 DIAGNOSIS — C61 Malignant neoplasm of prostate: Secondary | ICD-10-CM

## 2020-06-23 LAB — CBC WITH DIFFERENTIAL (CANCER CENTER ONLY)
Abs Immature Granulocytes: 0.04 10*3/uL (ref 0.00–0.07)
Basophils Absolute: 0 10*3/uL (ref 0.0–0.1)
Basophils Relative: 0 %
Eosinophils Absolute: 0 10*3/uL (ref 0.0–0.5)
Eosinophils Relative: 0 %
HCT: 24.9 % — ABNORMAL LOW (ref 39.0–52.0)
Hemoglobin: 7.6 g/dL — ABNORMAL LOW (ref 13.0–17.0)
Immature Granulocytes: 0 %
Lymphocytes Relative: 25 %
Lymphs Abs: 2.7 10*3/uL (ref 0.7–4.0)
MCH: 30.5 pg (ref 26.0–34.0)
MCHC: 30.5 g/dL (ref 30.0–36.0)
MCV: 100 fL (ref 80.0–100.0)
Monocytes Absolute: 0.6 10*3/uL (ref 0.1–1.0)
Monocytes Relative: 6 %
Neutro Abs: 7.2 10*3/uL (ref 1.7–7.7)
Neutrophils Relative %: 69 %
Platelet Count: 177 10*3/uL (ref 150–400)
RBC: 2.49 MIL/uL — ABNORMAL LOW (ref 4.22–5.81)
RDW: 19.1 % — ABNORMAL HIGH (ref 11.5–15.5)
WBC Count: 10.6 10*3/uL — ABNORMAL HIGH (ref 4.0–10.5)
nRBC: 0.8 % — ABNORMAL HIGH (ref 0.0–0.2)

## 2020-06-23 LAB — PREPARE RBC (CROSSMATCH)

## 2020-06-23 LAB — SAMPLE TO BLOOD BANK

## 2020-06-23 MED ORDER — HYDROCODONE-ACETAMINOPHEN 5-325 MG PO TABS: 1.0000 | ORAL_TABLET | Freq: Four times a day (QID) | ORAL | 0 refills | Status: DC | PRN

## 2020-06-23 MED ORDER — SODIUM CHLORIDE 0.9% IV SOLUTION
250.0000 mL | Freq: Once | INTRAVENOUS | Status: AC
  Administered 2020-06-23: 12:00:00 250 mL via INTRAVENOUS
  Filled 2020-06-23: qty 250

## 2020-06-23 MED ORDER — DIPHENHYDRAMINE HCL 25 MG PO CAPS
ORAL_CAPSULE | ORAL | Status: AC
  Filled 2020-06-23: qty 1

## 2020-06-23 MED ORDER — ACETAMINOPHEN 325 MG PO TABS
650.0000 mg | ORAL_TABLET | Freq: Once | ORAL | Status: AC
  Administered 2020-06-23: 12:00:00 650 mg via ORAL

## 2020-06-23 MED ORDER — DIPHENHYDRAMINE HCL 25 MG PO CAPS
25.0000 mg | ORAL_CAPSULE | Freq: Once | ORAL | Status: AC
  Administered 2020-06-23: 12:00:00 25 mg via ORAL

## 2020-06-23 MED ORDER — ACETAMINOPHEN 325 MG PO TABS
ORAL_TABLET | ORAL | Status: AC
  Filled 2020-06-23: qty 2

## 2020-06-23 NOTE — Patient Instructions (Signed)

## 2020-06-24 LAB — TYPE AND SCREEN
ABO/RH(D): A POS
Antibody Screen: NEGATIVE
Unit division: 0
Unit division: 0

## 2020-06-24 LAB — BPAM RBC
Blood Product Expiration Date: 202112312359
Blood Product Expiration Date: 202201052359
ISSUE DATE / TIME: 202112141338
ISSUE DATE / TIME: 202112141338
Unit Type and Rh: 6200
Unit Type and Rh: 6200

## 2020-07-04 ENCOUNTER — Emergency Department (HOSPITAL_COMMUNITY): Payer: Medicare Other

## 2020-07-04 ENCOUNTER — Other Ambulatory Visit: Payer: Self-pay

## 2020-07-04 ENCOUNTER — Emergency Department (HOSPITAL_COMMUNITY)
Admission: EM | Admit: 2020-07-04 | Discharge: 2020-07-04 | Disposition: A | Discharge status: Home / Self Care | Payer: Medicare Other | Attending: Emergency Medicine | Admitting: Emergency Medicine

## 2020-07-04 ENCOUNTER — Encounter (HOSPITAL_COMMUNITY): Payer: Self-pay | Admitting: Emergency Medicine

## 2020-07-04 DIAGNOSIS — R000 Tachycardia, unspecified: Secondary | ICD-10-CM | POA: Diagnosis not present

## 2020-07-04 DIAGNOSIS — Z8546 Personal history of malignant neoplasm of prostate: Secondary | ICD-10-CM | POA: Insufficient documentation

## 2020-07-04 DIAGNOSIS — I110 Hypertensive heart disease with heart failure: Secondary | ICD-10-CM | POA: Insufficient documentation

## 2020-07-04 DIAGNOSIS — Z20822 Contact with and (suspected) exposure to covid-19: Secondary | ICD-10-CM | POA: Insufficient documentation

## 2020-07-04 DIAGNOSIS — Z87891 Personal history of nicotine dependence: Secondary | ICD-10-CM | POA: Diagnosis not present

## 2020-07-04 DIAGNOSIS — I5032 Chronic diastolic (congestive) heart failure: Secondary | ICD-10-CM | POA: Diagnosis not present

## 2020-07-04 DIAGNOSIS — R519 Headache, unspecified: Secondary | ICD-10-CM | POA: Insufficient documentation

## 2020-07-04 DIAGNOSIS — Z79899 Other long term (current) drug therapy: Secondary | ICD-10-CM | POA: Insufficient documentation

## 2020-07-04 LAB — URINALYSIS, ROUTINE W REFLEX MICROSCOPIC
Bilirubin Urine: NEGATIVE
Glucose, UA: NEGATIVE mg/dL
Hgb urine dipstick: NEGATIVE
Ketones, ur: NEGATIVE mg/dL
Leukocytes,Ua: NEGATIVE
Nitrite: NEGATIVE
Protein, ur: NEGATIVE mg/dL
Specific Gravity, Urine: 1.005 (ref 1.005–1.030)
pH: 6 (ref 5.0–8.0)

## 2020-07-04 LAB — CBC WITH DIFFERENTIAL/PLATELET
Abs Immature Granulocytes: 0.03 10*3/uL (ref 0.00–0.07)
Basophils Absolute: 0 10*3/uL (ref 0.0–0.1)
Basophils Relative: 0 %
Eosinophils Absolute: 0.1 10*3/uL (ref 0.0–0.5)
Eosinophils Relative: 1 %
HCT: 38.9 % — ABNORMAL LOW (ref 39.0–52.0)
Hemoglobin: 12.1 g/dL — ABNORMAL LOW (ref 13.0–17.0)
Immature Granulocytes: 0 %
Lymphocytes Relative: 25 %
Lymphs Abs: 2 10*3/uL (ref 0.7–4.0)
MCH: 30.4 pg (ref 26.0–34.0)
MCHC: 31.1 g/dL (ref 30.0–36.0)
MCV: 97.7 fL (ref 80.0–100.0)
Monocytes Absolute: 0.4 10*3/uL (ref 0.1–1.0)
Monocytes Relative: 5 %
Neutro Abs: 5.4 10*3/uL (ref 1.7–7.7)
Neutrophils Relative %: 69 %
Platelets: 144 10*3/uL — ABNORMAL LOW (ref 150–400)
RBC: 3.98 MIL/uL — ABNORMAL LOW (ref 4.22–5.81)
RDW: 18.2 % — ABNORMAL HIGH (ref 11.5–15.5)
WBC: 7.9 10*3/uL (ref 4.0–10.5)
nRBC: 0.3 % — ABNORMAL HIGH (ref 0.0–0.2)

## 2020-07-04 LAB — RESP PANEL BY RT-PCR (FLU A&B, COVID) ARPGX2
Influenza A by PCR: NEGATIVE
Influenza B by PCR: NEGATIVE
SARS Coronavirus 2 by RT PCR: NEGATIVE

## 2020-07-04 LAB — I-STAT CHEM 8, ED
BUN: 7 mg/dL — ABNORMAL LOW (ref 8–23)
Calcium, Ion: 1.17 mmol/L (ref 1.15–1.40)
Chloride: 103 mmol/L (ref 98–111)
Creatinine, Ser: 0.6 mg/dL — ABNORMAL LOW (ref 0.61–1.24)
Glucose, Bld: 93 mg/dL (ref 70–99)
HCT: 43 % (ref 39.0–52.0)
Hemoglobin: 14.6 g/dL (ref 13.0–17.0)
Potassium: 4.5 mmol/L (ref 3.5–5.1)
Sodium: 140 mmol/L (ref 135–145)
TCO2: 28 mmol/L (ref 22–32)

## 2020-07-04 MED ORDER — MORPHINE SULFATE (PF) 4 MG/ML IV SOLN
4.0000 mg | Freq: Once | INTRAVENOUS | Status: AC
Start: 2020-07-04 — End: 2020-07-04
  Administered 2020-07-04: 11:00:00 4 mg via INTRAVENOUS
  Filled 2020-07-04: qty 1

## 2020-07-04 MED ORDER — ACETAMINOPHEN 500 MG PO TABS
1000.0000 mg | ORAL_TABLET | Freq: Once | ORAL | Status: DC
  Filled 2020-07-04: qty 2

## 2020-07-04 NOTE — Discharge Instructions (Signed)
You have been evaluated for your headache.  Get a CT scan obtained today which showed no obvious acute finding however they are some smaller lesions in your brain that can be better evaluate with a brain MRI with and without contrast.  Please discuss this with your primary care doctor to get the MRI done outpatient.  Take Tylenol as needed for your headache.  Return if you have any concern.  Your Covid test today is negative.  Happy birthday and Gregory [name].

## 2020-07-04 NOTE — ED Provider Notes (Signed)
[facility] DEPT Provider Note   CSN: [id number] Arrival date & time: 07/04/20  0945     History Chief Complaint  Patient presents with  . Headache    Gregory [name] is a 86 y.o. male.  The history is provided by the patient and medical records. No language interpreter was used.  Headache    86 year old male with metastatic prostate cancer who presents complaining of headache.  Patient developed acute onset of bitemporal headache since last night.  Headache has been waxing and waning but moderate in severity.  He also endorsed some body aches, generalized weakness, occasional cough.  He denies having fever or chills no chest pain or shortness of breath no abdominal pain nauseous vomiting or diarrhea or dysuria.  No loss of taste or smell.  He has been fully vaccinated for COVID-19.  He does not normally have headaches.  He did try taking some over-the-counter medication for his headache without improvement.  Past Medical History:  Diagnosis Date  . Bone metastases ([place])   . BPH (benign prostatic hyperplasia)   . Death of wife 31  . Foley catheter in place    Urinary Retension  . GERD (gastroesophageal reflux disease)   . Hypertension   . Prostate cancer [facility])   urologist- dr dahlstedt/  oncologist-  dr manning    Gleason 4+5,  PSA 18.74,  w/  Visceral and Extensive Bones METS--  pallitive radiation therapy and hormone therapy    Patient Active Problem List   Diagnosis Date Noted  . Symptomatic anemia 01/01/2020  . Chronic diastolic CHF (congestive heart failure) ([place]) 01/01/2020  . Hearing decreased, bilateral 12/29/2018  . Visual changes 12/29/2018  . Difficult intravenous access   . Elevated troponin 09/01/2015  . Diastolic dysfunction with acute on chronic heart failure ([place]) 09/01/2015  . AKI (acute kidney injury) ([place]) 08/30/2015  . Hyperkalemia 08/30/2015  . Prostate cancer ([place]) 08/29/2015  . Hematuria   . S/P TURP   . Urinary  retention   . Metastatic adenocarcinoma to bone ([place]) 08/21/2015  . Essential hypertension 04/14/2015  . Malignant neoplasm prostate ([place]) 04/14/2015  . Hyperglycemia 04/14/2015  . Other fatigue 04/14/2015    Past Surgical History:  Procedure Laterality Date  . MULTIPLE TOOTH EXTRACTIONS  1980's  . ORCHIECTOMY Bilateral 08/27/2015   Procedure: ORCHIECTOMY;  Surgeon: Franchot Gallo, MD;  Location[facility];  Service: Urology;  Laterality: Bilateral;  . PROSTATE BIOPSY  1/16 /2017  . TRANSURETHRAL RESECTION OF PROSTATE N/A 08/27/2015   Procedure: TRANSURETHRAL RESECTION OF THE PROSTATE WITH GYRUS INSTRUMENTS;  Surgeon: Franchot Gallo, MD;  Location[facility];  Service: Urology;  Laterality: N/A;       Family History  Problem Relation Age of Onset  . Tuberculosis Mother 8  . Other Father 64    Social History   Tobacco Use  . Smoking status: Former Smoker    Packs/day: 0.25    Years: 30.00    Pack years: 7.50    Types: Cigarettes    Start date: 07/11/1992  . Smokeless tobacco: Never Used  Vaping Use  . Vaping Use: Never used  Substance Use Topics  . Alcohol use: No    Alcohol/week: 0.0 standard drinks  . Drug use: No    Home Medications Prior to Admission medications   Medication Sig Start Date End Date Taking? Authorizing Provider  HYDROcodone-acetaminophen (NORCO) 5-325 MG tablet Take 1 tablet by mouth every 6 (six) hours as needed  for moderate pain. 06/23/20   Benjiman Core, MD  metoprolol succinate (TOPROL-XL) 100 MG 24 hr tablet TAKE 1 TABLET (100 MG TOTAL) BY MOUTH DAILY. TAKE WITH OR IMMEDIATELY FOLLOWING A MEAL. 09/24/19   Kallie Locks, FNP  polyethylene glycol [facility] Hal) packet Take 17 g by mouth daily as needed for mild constipation. 09/05/15   Joseph Art, DO  tamsulosin (FLOMAX) 0.4 MG CAPS capsule Take 0.4 mg by mouth daily. Patient not taking: Reported on 03/02/2020 01/16/20   [provider]   triamcinolone lotion (KENALOG) 0.1 % Apply 1 application topically 3 (three) times daily. Do not use on face. 05/29/20   Ceasar Mons., PA-C    Allergies    Patient has no known allergies.  Review of Systems   Review of Systems  Neurological: Positive for headaches.  All other systems reviewed and are negative.   Physical Exam Updated Vital Signs BP 121/75   Pulse 94   Temp 97.6 F (36.4 C) (Oral)   Resp 13   SpO2 100%   Physical Exam Vitals and nursing note reviewed.  Constitutional:      General: He is not in acute distress.    Appearance: He is well-developed and well-nourished.     Comments: Elderly male well appearing in no acute discomfort.  HENT:     Head: Atraumatic.  Eyes:     General: No visual field deficit.    Conjunctiva/sclera: Conjunctivae normal.  Cardiovascular:     Rate and Rhythm: Tachycardia present.  Pulmonary:     Effort: Pulmonary effort is normal.     Breath sounds: Normal breath sounds. No wheezing, rhonchi or rales.  Abdominal:     General: Bowel sounds are normal.     Palpations: Abdomen is soft.     Tenderness: There is no abdominal tenderness.  Musculoskeletal:        General: Normal range of motion.     Cervical back: Neck supple.     Comments: Equal grip strength bilaterally.  LLE weaker than RLE, chronic  Skin:    Findings: No rash.  Neurological:     Mental Status: He is alert.     GCS: GCS eye subscore is 4. GCS verbal subscore is 5. GCS motor subscore is 6.     Cranial Nerves: No cranial nerve deficit, dysarthria or facial asymmetry.  Psychiatric:        Mood and Affect: Mood and affect and mood normal.     ED Results / Procedures / Treatments   Labs (all labs ordered are listed, but only abnormal results are displayed) Labs Reviewed  CBC WITH DIFFERENTIAL/PLATELET - Abnormal; Notable for the following components:      Result Value   RBC 3.98 (*)    Hemoglobin 12.1 (*)    HCT 38.9 (*)    RDW 18.2 (*)    Platelets  144 (*)    nRBC 0.3 (*)    All other components within normal limits  I-STAT CHEM 8, ED - Abnormal; Notable for the following components:   BUN 7 (*)    Creatinine, Ser 0.60 (*)    All other components within normal limits  RESP PANEL BY RT-PCR (FLU A&B, COVID) ARPGX2  URINALYSIS, ROUTINE W REFLEX MICROSCOPIC    EKG EKG Interpretation  Date/Time:  Saturday July 04 2020 10:25:59 EST Ventricular Rate:  102 PR Interval:    QRS Duration: 97 QT Interval:  345 QTC Calculation: 450 R Axis:  77 Text Interpretation: Sinus tachycardia Atrial premature complex Abnormal R-wave progression, early transition Minimal ST depression, diffuse leads Confirmed by Lennice Sites [phone]) on 07/04/2020 10:45:43 AM   Radiology CT Head Wo Contrast  Result Date: 07/04/2020 CLINICAL DATA:  Worsening by temporal headache since yesterday. History of prostate cancer. EXAM: CT HEAD WITHOUT CONTRAST TECHNIQUE: Contiguous axial images were obtained from the base of the skull through the vertex without intravenous contrast. COMPARISON:  08/30/2014 FINDINGS: Brain: Ventricles and cisterns are normal. There is prominence of the CSF spaces particularly over the frontal lobes unchanged. There are a few tiny isodense to hyperdense foci over the right frontal and right parietal CSF space of uncertain clinical significance. These may represent subarachnoid vessels and less likely metastatic disease. There is no mass, mass effect, shift of midline structures or acute hemorrhage. No evidence of acute infarction. Vascular: No hyperdense vessel or unexpected calcification. Skull: There is moderate increased density of C1 and mild increased density over the skull base likely due to patient's known osseous metastatic disease. Sinuses/Orbits: Moderate opacification over the right sphenoid sinus compatible chronic inflammatory change. Orbits are normal. Other: None. IMPRESSION: 1. No acute findings. 2. A few tiny isodense to  hyperdense foci over the right frontal and right parietal CSF space of uncertain clinical significance. These may represent prominent subarachnoid vessels, although metastatic disease is possible. Recommend MRI of the brain with without contrast for further evaluation on an elective basis in this patient with known metastatic prostate cancer. 3. Mild chronic inflammatory change of the right sphenoid sinus. 4. Known osseous metastatic disease. Electronically Signed   By: Marin Olp M.D.   On: 07/04/2020 10:54   DG Chest Portable 1 View  Result Date: 07/04/2020 CLINICAL DATA:  Cough EXAM: PORTABLE CHEST 1 VIEW COMPARISON:  01/01/2020 FINDINGS: Streaky density at the right base, similar to prior and associated with elevated diaphragm. No convincing consolidation. No edema, effusion, or pneumothorax. Normal heart size and mediastinal contours. Extensive osseous metastatic disease, known. IMPRESSION: 1. No acute finding. 2. Chronic right diaphragm elevation with overlying scar. Electronically Signed   By: Monte Fantasia M.D.   On: 07/04/2020 10:32    Procedures Procedures (including critical care time)  Medications Ordered in ED Medications  morphine 4 MG/ML injection 4 mg (4 mg Intravenous Given 07/04/20 1110)    ED Course  I have reviewed the triage vital signs and the nursing notes.  Pertinent labs & imaging results that were available during my care of the patient were reviewed by me and considered in my medical decision making (see chart for details).    MDM Rules/Calculators/A&P                          BP 121/75   Pulse 94   Temp 97.6 F (36.4 C) (Oral)   Resp 13   SpO2 100%   Final Clinical Impression(s) / ED Diagnoses Final diagnoses:  Bad headache    Rx / DC Orders ED Discharge Orders    None     10:12 AM Patient here with complaints of headache, body aches, and occasional cough.  He has been fully vaccinated for COVID-19.  He does have history of metastatic  prostate cancer and denies having regular headache therefore a head CT scan would be appropriate.  Patient found to be tachycardic with heart rate of 130, IV fluid given, will obtain labs, along with chest x-ray.  He is afebrile and he is not  hypotensive.  Patient satting at 100% on room air.  2:39 PM Patient's labs are reassuring, no leukocytosis, electrolyte panels are reassuring, Covid test here is negative, UA negative, head CT scan showed no acute finding.  There is a few tiny isodense to hyperdense foci over the right frontal and right parietal CSF space of uncertain clinical significance.  These could be subarachnoid vessels although metastatic disease is possible.  Radiologist recommend MRI of the brain with and without contrast to further evaluation on elective basis.  His chest x-ray unremarkable.  He doesn't have any focal neuro deficit on exam.  I discussed care with Dr. Ronnald Nian.  Plan to discharge home with symptomatic treatment and encourage patient to follow-up for MRI outpatient.   Domenic Moras, PA-C 07/04/20 1445    Lennice Sites, DO 07/04/20 1547

## 2020-07-04 NOTE — ED Provider Notes (Signed)
Medical screening examination/treatment/procedure(s) were conducted as a shared visit with non-physician practitioner(s) and myself.  I personally evaluated the patient during the encounter. Briefly, the patient is a 86 y.o. male with history of prostate cancer presents to the ED with headache.  Normal vitals.  No fever.  Patient concern for Covid.  Neurologically patient is intact.  Overall well-appearing.  Has already received IV pain medication and headache has resolved.  No concern for meningitis.  CT scan overall unremarkable.  Some nonspecific changes that someone with prostate cancer history could be concerning for metastatic disease.  Will need MRI outpatient.  Lab work overall unremarkable.  Headache likely in the setting of viral process or sinus process.  Recommend follow-up primary care doctor anticipate discharge.   EKG Interpretation  Date/Time:  Saturday July 04 2020 10:25:59 EST Ventricular Rate:  102 PR Interval:    QRS Duration: 97 QT Interval:  345 QTC Calculation: 450 R Axis:   77 Text Interpretation: Sinus tachycardia Atrial premature complex Abnormal R-wave progression, early transition Minimal ST depression, diffuse leads Confirmed by Lennice Sites [phone]) on 07/04/2020 10:45:43 AM           Lennice Sites, DO 07/04/20 1326

## 2020-07-04 NOTE — ED Triage Notes (Signed)
Pt coming from home with a headache that has been happening since last night. Pt states it is a generalized headache that is on both sides.

## 2020-07-14 ENCOUNTER — Other Ambulatory Visit: Payer: Self-pay

## 2020-07-14 ENCOUNTER — Inpatient Hospital Stay: Payer: Medicare Other | Attending: Oncology

## 2020-07-14 ENCOUNTER — Telehealth: Payer: Self-pay | Admitting: Emergency Medicine

## 2020-07-14 ENCOUNTER — Inpatient Hospital Stay: Payer: Medicare Other

## 2020-07-14 DIAGNOSIS — C61 Malignant neoplasm of prostate: Secondary | ICD-10-CM | POA: Diagnosis not present

## 2020-07-14 DIAGNOSIS — C7951 Secondary malignant neoplasm of bone: Secondary | ICD-10-CM | POA: Insufficient documentation

## 2020-07-14 LAB — CBC WITH DIFFERENTIAL (CANCER CENTER ONLY)
Abs Immature Granulocytes: 0.04 10*3/uL (ref 0.00–0.07)
Basophils Absolute: 0 10*3/uL (ref 0.0–0.1)
Basophils Relative: 0 %
Eosinophils Absolute: 0.1 10*3/uL (ref 0.0–0.5)
Eosinophils Relative: 1 %
HCT: 30.5 % — ABNORMAL LOW (ref 39.0–52.0)
Hemoglobin: 9.9 g/dL — ABNORMAL LOW (ref 13.0–17.0)
Immature Granulocytes: 1 %
Lymphocytes Relative: 29 %
Lymphs Abs: 2.4 10*3/uL (ref 0.7–4.0)
MCH: 30.5 pg (ref 26.0–34.0)
MCHC: 32.5 g/dL (ref 30.0–36.0)
MCV: 93.8 fL (ref 80.0–100.0)
Monocytes Absolute: 0.5 10*3/uL (ref 0.1–1.0)
Monocytes Relative: 6 %
Neutro Abs: 5.2 10*3/uL (ref 1.7–7.7)
Neutrophils Relative %: 63 %
Platelet Count: 140 10*3/uL — ABNORMAL LOW (ref 150–400)
RBC: 3.25 MIL/uL — ABNORMAL LOW (ref 4.22–5.81)
RDW: 18.4 % — ABNORMAL HIGH (ref 11.5–15.5)
WBC Count: 8.2 10*3/uL (ref 4.0–10.5)
nRBC: 0.4 % — ABNORMAL HIGH (ref 0.0–0.2)

## 2020-07-14 LAB — SAMPLE TO BLOOD BANK

## 2020-07-14 NOTE — Progress Notes (Signed)
Pt does not meet parameters of blood transfusion today.  No treatment needed.  Pt aware.  Paulette contacted to pick him up.

## 2020-07-14 NOTE — Telephone Encounter (Signed)
Called pt's daughter Mackey Birchwood to let her know pt is not getting transfusion today.  Pt aware of no treatment today.  Paulette is on the way to pick him up.  Pt waiting in lobby with belongings.

## 2020-08-04 ENCOUNTER — Inpatient Hospital Stay: Payer: Medicare Other

## 2020-08-04 ENCOUNTER — Other Ambulatory Visit: Payer: Self-pay

## 2020-08-04 ENCOUNTER — Telehealth: Payer: Self-pay | Admitting: *Deleted

## 2020-08-04 DIAGNOSIS — C61 Malignant neoplasm of prostate: Secondary | ICD-10-CM

## 2020-08-04 LAB — CBC WITH DIFFERENTIAL (CANCER CENTER ONLY)
Abs Immature Granulocytes: 0.04 10*3/uL (ref 0.00–0.07)
Basophils Absolute: 0 10*3/uL (ref 0.0–0.1)
Basophils Relative: 0 %
Eosinophils Absolute: 0.1 10*3/uL (ref 0.0–0.5)
Eosinophils Relative: 1 %
HCT: 29.1 % — ABNORMAL LOW (ref 39.0–52.0)
Hemoglobin: 9 g/dL — ABNORMAL LOW (ref 13.0–17.0)
Immature Granulocytes: 1 %
Lymphocytes Relative: 44 %
Lymphs Abs: 3.9 10*3/uL (ref 0.7–4.0)
MCH: 30.8 pg (ref 26.0–34.0)
MCHC: 30.9 g/dL (ref 30.0–36.0)
MCV: 99.7 fL (ref 80.0–100.0)
Monocytes Absolute: 0.4 10*3/uL (ref 0.1–1.0)
Monocytes Relative: 4 %
Neutro Abs: 4.4 10*3/uL (ref 1.7–7.7)
Neutrophils Relative %: 50 %
Platelet Count: 140 10*3/uL — ABNORMAL LOW (ref 150–400)
RBC: 2.92 MIL/uL — ABNORMAL LOW (ref 4.22–5.81)
RDW: 20.4 % — ABNORMAL HIGH (ref 11.5–15.5)
WBC Count: 8.8 10*3/uL (ref 4.0–10.5)
nRBC: 1 % — ABNORMAL HIGH (ref 0.0–0.2)

## 2020-08-04 LAB — SAMPLE TO BLOOD BANK

## 2020-08-04 NOTE — Telephone Encounter (Signed)
Called daughter and let her know Hgb today is 56, Mr Hissong does not need blood. She will come pick him up

## 2020-08-25 ENCOUNTER — Inpatient Hospital Stay (HOSPITAL_COMMUNITY)
Admission: EM | Admit: 2020-08-25 | Discharge: 2020-08-27 | DRG: 542 | Disposition: A | Discharge status: Hospice | Payer: Medicare Other | Attending: Family Medicine | Admitting: Family Medicine

## 2020-08-25 ENCOUNTER — Inpatient Hospital Stay: Payer: Medicare Other

## 2020-08-25 ENCOUNTER — Inpatient Hospital Stay: Payer: Medicare Other | Attending: Oncology

## 2020-08-25 ENCOUNTER — Inpatient Hospital Stay (HOSPITAL_BASED_OUTPATIENT_CLINIC_OR_DEPARTMENT_OTHER): Payer: Medicare Other | Admitting: Medical

## 2020-08-25 ENCOUNTER — Other Ambulatory Visit: Payer: Self-pay

## 2020-08-25 ENCOUNTER — Emergency Department (HOSPITAL_COMMUNITY): Payer: Medicare Other

## 2020-08-25 ENCOUNTER — Other Ambulatory Visit: Payer: Self-pay | Admitting: Medical

## 2020-08-25 ENCOUNTER — Inpatient Hospital Stay (HOSPITAL_COMMUNITY): Payer: Medicare Other

## 2020-08-25 ENCOUNTER — Inpatient Hospital Stay (HOSPITAL_BASED_OUTPATIENT_CLINIC_OR_DEPARTMENT_OTHER): Payer: Medicare Other | Admitting: Oncology

## 2020-08-25 ENCOUNTER — Encounter (HOSPITAL_COMMUNITY): Payer: Self-pay

## 2020-08-25 VITALS — BP 91/38 | HR 89 | Temp 97.5°F | Resp 17 | Ht 72.0 in

## 2020-08-25 DIAGNOSIS — Z20822 Contact with and (suspected) exposure to covid-19: Secondary | ICD-10-CM | POA: Diagnosis present

## 2020-08-25 DIAGNOSIS — Z87891 Personal history of nicotine dependence: Secondary | ICD-10-CM

## 2020-08-25 DIAGNOSIS — N400 Enlarged prostate without lower urinary tract symptoms: Secondary | ICD-10-CM | POA: Diagnosis present

## 2020-08-25 DIAGNOSIS — R419 Unspecified symptoms and signs involving cognitive functions and awareness: Secondary | ICD-10-CM

## 2020-08-25 DIAGNOSIS — I110 Hypertensive heart disease with heart failure: Secondary | ICD-10-CM | POA: Diagnosis present

## 2020-08-25 DIAGNOSIS — D6959 Other secondary thrombocytopenia: Secondary | ICD-10-CM | POA: Diagnosis present

## 2020-08-25 DIAGNOSIS — R4189 Other symptoms and signs involving cognitive functions and awareness: Secondary | ICD-10-CM | POA: Diagnosis present

## 2020-08-25 DIAGNOSIS — Z887 Allergy status to serum and vaccine status: Secondary | ICD-10-CM

## 2020-08-25 DIAGNOSIS — R531 Weakness: Secondary | ICD-10-CM | POA: Diagnosis not present

## 2020-08-25 DIAGNOSIS — I5033 Acute on chronic diastolic (congestive) heart failure: Secondary | ICD-10-CM | POA: Diagnosis present

## 2020-08-25 DIAGNOSIS — I959 Hypotension, unspecified: Secondary | ICD-10-CM | POA: Diagnosis present

## 2020-08-25 DIAGNOSIS — C7931 Secondary malignant neoplasm of brain: Secondary | ICD-10-CM | POA: Diagnosis present

## 2020-08-25 DIAGNOSIS — D630 Anemia in neoplastic disease: Secondary | ICD-10-CM | POA: Diagnosis present

## 2020-08-25 DIAGNOSIS — Z66 Do not resuscitate: Secondary | ICD-10-CM | POA: Diagnosis present

## 2020-08-25 DIAGNOSIS — K219 Gastro-esophageal reflux disease without esophagitis: Secondary | ICD-10-CM | POA: Diagnosis present

## 2020-08-25 DIAGNOSIS — Z79899 Other long term (current) drug therapy: Secondary | ICD-10-CM

## 2020-08-25 DIAGNOSIS — C7951 Secondary malignant neoplasm of bone: Secondary | ICD-10-CM

## 2020-08-25 DIAGNOSIS — Z8546 Personal history of malignant neoplasm of prostate: Secondary | ICD-10-CM

## 2020-08-25 DIAGNOSIS — C61 Malignant neoplasm of prostate: Secondary | ICD-10-CM

## 2020-08-25 DIAGNOSIS — D649 Anemia, unspecified: Secondary | ICD-10-CM

## 2020-08-25 DIAGNOSIS — E872 Acidosis: Secondary | ICD-10-CM | POA: Diagnosis present

## 2020-08-25 DIAGNOSIS — Z9079 Acquired absence of other genital organ(s): Secondary | ICD-10-CM

## 2020-08-25 DIAGNOSIS — R627 Adult failure to thrive: Secondary | ICD-10-CM | POA: Diagnosis not present

## 2020-08-25 DIAGNOSIS — R5381 Other malaise: Secondary | ICD-10-CM | POA: Diagnosis not present

## 2020-08-25 DIAGNOSIS — J9601 Acute respiratory failure with hypoxia: Secondary | ICD-10-CM | POA: Diagnosis present

## 2020-08-25 DIAGNOSIS — Z515 Encounter for palliative care: Secondary | ICD-10-CM

## 2020-08-25 DIAGNOSIS — E861 Hypovolemia: Secondary | ICD-10-CM | POA: Diagnosis present

## 2020-08-25 DIAGNOSIS — Z7189 Other specified counseling: Secondary | ICD-10-CM | POA: Diagnosis not present

## 2020-08-25 DIAGNOSIS — R4182 Altered mental status, unspecified: Secondary | ICD-10-CM | POA: Diagnosis present

## 2020-08-25 LAB — IRON AND TIBC
Iron: 145 ug/dL (ref 45–182)
Saturation Ratios: 105 % — ABNORMAL HIGH (ref 17.9–39.5)
TIBC: 139 ug/dL — ABNORMAL LOW (ref 250–450)

## 2020-08-25 LAB — COMPREHENSIVE METABOLIC PANEL
ALT: 19 U/L (ref 0–44)
AST: 41 U/L (ref 15–41)
Albumin: 2.7 g/dL — ABNORMAL LOW (ref 3.5–5.0)
Alkaline Phosphatase: 471 U/L — ABNORMAL HIGH (ref 38–126)
Anion gap: 16 — ABNORMAL HIGH (ref 5–15)
BUN: 10 mg/dL (ref 8–23)
CO2: 24 mmol/L (ref 22–32)
Calcium: 8.9 mg/dL (ref 8.9–10.3)
Chloride: 101 mmol/L (ref 98–111)
Creatinine, Ser: 0.77 mg/dL (ref 0.61–1.24)
GFR, Estimated: >60 mL/min (ref >60)
Glucose, Bld: 141 mg/dL — ABNORMAL HIGH (ref 70–99)
Potassium: 3.7 mmol/L (ref 3.5–5.1)
Sodium: 141 mmol/L (ref 135–145)
Total Bilirubin: 1.6 mg/dL — ABNORMAL HIGH (ref 0.3–1.2)
Total Protein: 6.9 g/dL (ref 6.5–8.1)

## 2020-08-25 LAB — CBC WITH DIFFERENTIAL (CANCER CENTER ONLY)
Abs Immature Granulocytes: 0.08 10*3/uL — ABNORMAL HIGH (ref 0.00–0.07)
Basophils Absolute: 0 10*3/uL (ref 0.0–0.1)
Basophils Relative: 0 %
Eosinophils Absolute: 0 10*3/uL (ref 0.0–0.5)
Eosinophils Relative: 0 %
HCT: 22.9 % — ABNORMAL LOW (ref 39.0–52.0)
Hemoglobin: 7.4 g/dL — ABNORMAL LOW (ref 13.0–17.0)
Immature Granulocytes: 1 %
Lymphocytes Relative: 48 %
Lymphs Abs: 4.3 10*3/uL — ABNORMAL HIGH (ref 0.7–4.0)
MCH: 32.9 pg (ref 26.0–34.0)
MCHC: 32.3 g/dL (ref 30.0–36.0)
MCV: 101.8 fL — ABNORMAL HIGH (ref 80.0–100.0)
Monocytes Absolute: 0.4 10*3/uL (ref 0.1–1.0)
Monocytes Relative: 4 %
Neutro Abs: 4.3 10*3/uL (ref 1.7–7.7)
Neutrophils Relative %: 47 %
Platelet Count: 90 10*3/uL — ABNORMAL LOW (ref 150–400)
RBC: 2.25 MIL/uL — ABNORMAL LOW (ref 4.22–5.81)
RDW: 21.8 % — ABNORMAL HIGH (ref 11.5–15.5)
WBC Count: 9.1 10*3/uL (ref 4.0–10.5)
nRBC: 1.8 % — ABNORMAL HIGH (ref 0.0–0.2)

## 2020-08-25 LAB — RESP PANEL BY RT-PCR (FLU A&B, COVID) ARPGX2
Influenza A by PCR: NEGATIVE
Influenza B by PCR: NEGATIVE
SARS Coronavirus 2 by RT PCR: NEGATIVE

## 2020-08-25 LAB — PROTIME-INR
INR: 1.2 (ref 0.8–1.2)
Prothrombin Time: 15.2 seconds (ref 11.4–15.2)

## 2020-08-25 LAB — FERRITIN: Ferritin: >7500 ng/mL — ABNORMAL HIGH (ref 24–336)

## 2020-08-25 LAB — LACTIC ACID, PLASMA
Lactic Acid, Venous: 5.3 mmol/L (ref 0.5–1.9)
Lactic Acid, Venous: 2.3 mmol/L (ref 0.5–1.9)

## 2020-08-25 LAB — URINALYSIS, ROUTINE W REFLEX MICROSCOPIC
Bilirubin Urine: NEGATIVE
Glucose, UA: NEGATIVE mg/dL
Hgb urine dipstick: NEGATIVE
Ketones, ur: 5 mg/dL — AB
Leukocytes,Ua: NEGATIVE
Nitrite: NEGATIVE
Protein, ur: NEGATIVE mg/dL
Specific Gravity, Urine: 1.012 (ref 1.005–1.030)
pH: 5 (ref 5.0–8.0)

## 2020-08-25 LAB — SAMPLE TO BLOOD BANK

## 2020-08-25 LAB — FOLATE: Folate: 8.7 ng/mL (ref >5.9)

## 2020-08-25 LAB — RETICULOCYTES
Immature Retic Fract: 39.4 % — ABNORMAL HIGH (ref 2.3–15.9)
RBC.: 2.31 MIL/uL — ABNORMAL LOW (ref 4.22–5.81)
Retic Count, Absolute: 58.4 10*3/uL (ref 19.0–186.0)
Retic Ct Pct: 2.5 % (ref 0.4–3.1)

## 2020-08-25 LAB — GLUCOSE, CAPILLARY: Glucose-Capillary: 129 mg/dL — ABNORMAL HIGH (ref 70–99)

## 2020-08-25 LAB — TROPONIN I (HIGH SENSITIVITY)
Troponin I (High Sensitivity): 20 ng/L — ABNORMAL HIGH (ref <18)
Troponin I (High Sensitivity): 17 ng/L (ref <18)

## 2020-08-25 LAB — CBG MONITORING, ED: Glucose-Capillary: 115 mg/dL — ABNORMAL HIGH (ref 70–99)

## 2020-08-25 LAB — VITAMIN B12: Vitamin B-12: 2037 pg/mL — ABNORMAL HIGH (ref 180–914)

## 2020-08-25 LAB — PREPARE RBC (CROSSMATCH)

## 2020-08-25 MED ORDER — LACTATED RINGERS IV BOLUS: 1000.0000 mL | Freq: Once | INTRAVENOUS | Status: DC

## 2020-08-25 MED ORDER — ACETAMINOPHEN 650 MG RE SUPP: 650.0000 mg | Freq: Four times a day (QID) | RECTAL | Status: DC | PRN

## 2020-08-25 MED ORDER — LACTATED RINGERS IV BOLUS
500.0000 mL | Freq: Once | INTRAVENOUS | Status: AC
  Administered 2020-08-25: 500 mL via INTRAVENOUS

## 2020-08-25 MED ORDER — SODIUM CHLORIDE 0.9 % IV BOLUS
500.0000 mL | Freq: Once | INTRAVENOUS | Status: AC
  Administered 2020-08-25: 500 mL via INTRAVENOUS

## 2020-08-25 MED ORDER — SODIUM CHLORIDE 0.9 % IV SOLN
Freq: Once | INTRAVENOUS | Status: AC
  Filled 2020-08-25: qty 250

## 2020-08-25 MED ORDER — TAMSULOSIN HCL 0.4 MG PO CAPS
0.4000 mg | ORAL_CAPSULE | Freq: Every day | ORAL | Status: DC
  Administered 2020-08-25 – 2020-08-27 (×3): 0.4 mg via ORAL
  Filled 2020-08-25 (×3): qty 1

## 2020-08-25 MED ORDER — SODIUM CHLORIDE 0.9 % IV SOLN
10.0000 mL/h | Freq: Once | INTRAVENOUS | Status: AC
  Administered 2020-08-25: 10 mL/h via INTRAVENOUS

## 2020-08-25 MED ORDER — ACETAMINOPHEN 325 MG PO TABS: 650.0000 mg | ORAL_TABLET | Freq: Four times a day (QID) | ORAL | Status: DC | PRN

## 2020-08-25 NOTE — ED Notes (Signed)
Date and time results received: 08/25/20   Test: lactic Critical Value: 5.3  Name of Provider Notified: Ralene Bathe MD  Orders Received? Or Actions Taken?: In process

## 2020-08-25 NOTE — ED Provider Notes (Signed)
Patient care assumed at 1600.  Pt referred to the ED from cancer center following syncopal/near syncopal episode.  He is anemic and blood transfusion is in process, additional labs pending.    Labs significant for lactic acidosis, overall no clear source of infection, possibly due to dehydration and anemia.  CT scan with progressive subdural hygromas, sinusitis.  D/w pt and daughter (over the phone) findings of studies, plan to admit for ongoing treatment.     Quintella Reichert, MD 08/25/20 2229

## 2020-08-25 NOTE — Plan of Care (Signed)
   Problem: Elimination: ?Goal: Will not experience complications related to bowel motility ?Outcome: Progressing ?Goal: Will not experience complications related to urinary retention ?Outcome: Progressing ?  ?Problem: Activity: ?Goal: Risk for activity intolerance will decrease ?Outcome: Progressing ?  ?Problem: Safety: ?Goal: Ability to remain free from injury will improve ?Outcome: Progressing ?  ?

## 2020-08-25 NOTE — ED Triage Notes (Signed)
Pt arrives via WC from the cancer center. Pt with hx of anemia and advanced prostate cancer. Pt was at cancer center for 1 unit of RBC for hgb of 7.4. Per Cancer center- pt arrived unresponsive. Pt was found to be hypotensive. Pt endorses weakness at this time.

## 2020-08-25 NOTE — Progress Notes (Signed)
Patient brought over to infusion area in wheelchair. Upon arrival patient became increasingly short of breath, dizzy, lethargic and pale. BP obtained and BP 86/54. MD and PA at chairside. IV started in RAC and bolus of normal saline initiated. Re-check BP 101/49. MD contacted ED to request bed. Patient transported via wheelchair to ED.

## 2020-08-25 NOTE — ED Provider Notes (Signed)
[facility] DEPT Provider Note   CSN: [id number] Arrival date & time: 08/25/20  1338     History Chief Complaint  Patient presents with  . Altered Mental Status    Gregory [name] is a 87 y.o. male.  HPI 87 year old male presents from cancer center with near syncope and lethargy.  History is somewhat limited from the patient and there is no one else in the room with him.  He went to the cancer center and was going to be given a transfusion but it seemed like he had a near syncopal episode and was lethargic.  Patient endorses generalized weakness and lightheadedness when sitting or standing for quite some time.  No bleeding.  He denies a fever but does have a chronic cough.  Otherwise he seems slightly confused about why he is here.  Past Medical History:  Diagnosis Date  . Bone metastases ([place])   . BPH (benign prostatic hyperplasia)   . Death of wife 24  . Foley catheter in place    Urinary Retension  . GERD (gastroesophageal reflux disease)   . Hypertension   . Prostate cancer [facility])   urologist- dr dahlstedt/  oncologist-  dr manning    Gleason 4+5,  PSA 18.74,  w/  Visceral and Extensive Bones METS--  pallitive radiation therapy and hormone therapy    Patient Active Problem List   Diagnosis Date Noted  . Symptomatic anemia 01/01/2020  . Chronic diastolic CHF (congestive heart failure) ([place]) 01/01/2020  . Hearing decreased, bilateral 12/29/2018  . Visual changes 12/29/2018  . Difficult intravenous access   . Elevated troponin 09/01/2015  . Diastolic dysfunction with acute on chronic heart failure ([place]) 09/01/2015  . AKI (acute kidney injury) ([place]) 08/30/2015  . Hyperkalemia 08/30/2015  . Prostate cancer ([place]) 08/29/2015  . Hematuria   . S/P TURP   . Urinary retention   . Metastatic adenocarcinoma to bone ([place]) 08/21/2015  . Essential hypertension 04/14/2015  . Malignant neoplasm prostate ([place]) 04/14/2015  . Hyperglycemia 04/14/2015   . Other fatigue 04/14/2015    Past Surgical History:  Procedure Laterality Date  . MULTIPLE TOOTH EXTRACTIONS  1980's  . ORCHIECTOMY Bilateral 08/27/2015   Procedure: ORCHIECTOMY;  Surgeon: Franchot Gallo, MD;  Location: [facility];  Service: Urology;  Laterality: Bilateral;  . PROSTATE BIOPSY  1/16 /2017  . TRANSURETHRAL RESECTION OF PROSTATE N/A 08/27/2015   Procedure: TRANSURETHRAL RESECTION OF THE PROSTATE WITH GYRUS INSTRUMENTS;  Surgeon: Franchot Gallo, MD;  Location: [facility];  Service: Urology;  Laterality: N/A;       Family History  Problem Relation Age of Onset  . Tuberculosis Mother 62  . Other Father 59    Social History   Tobacco Use  . Smoking status: Former Smoker    Packs/day: 0.25    Years: 30.00    Pack years: 7.50    Types: Cigarettes    Start date: 07/11/1992  . Smokeless tobacco: Never Used  Vaping Use  . Vaping Use: Never used  Substance Use Topics  . Alcohol use: No    Alcohol/week: 0.0 standard drinks  . Drug use: No    Home Medications Prior to Admission medications   Medication Sig Start Date End Date Taking? Authorizing Provider  cholecalciferol (VITAMIN D3) 25 MCG (1000 UNIT) tablet Take 1,000 Units by mouth daily.   Yes [provider]  HYDROcodone-acetaminophen (NORCO) 5-325 MG tablet Take 1 tablet by mouth every 6 (six) hours  as needed for moderate pain. 06/23/20  Yes Wyatt Portela, MD  metoprolol succinate (TOPROL-XL) 100 MG 24 hr tablet TAKE 1 TABLET (100 MG TOTAL) BY MOUTH DAILY. TAKE WITH OR IMMEDIATELY FOLLOWING A MEAL. Patient taking differently: Take 100 mg by mouth daily. 09/24/19  Yes [name], FNP  Multiple Vitamin (MULTIVITAMIN WITH MINERALS) TABS tablet Take 1 tablet by mouth daily.   Yes [provider]  tamsulosin (FLOMAX) 0.4 MG CAPS capsule Take 0.4 mg by mouth daily. 01/16/20  Yes [provider]  triamcinolone lotion (KENALOG) 0.1 % Apply 1  application topically 3 (three) times daily. Do not use on face. Patient taking differently: Apply 1 application topically 3 (three) times daily as needed (rash). 05/29/20  Yes Tanner, Lyndon Code., PA-C  polyethylene glycol (MIRALAX / GLYCOLAX) packet Take 17 g by mouth daily as needed for mild constipation. Patient not taking: No sig reported 09/05/15   Geradine Girt, DO    Allergies    Covid-19 (adenovirus) vaccine  Review of Systems   Review of Systems  Constitutional: Positive for fatigue. Negative for fever.  Respiratory: Positive for cough. Negative for shortness of breath.   Cardiovascular: Negative for chest pain.  Gastrointestinal: Negative for abdominal pain.  Neurological: Positive for weakness and light-headedness. Negative for headaches.  All other systems reviewed and are negative.   Physical Exam Updated Vital Signs BP (!) 108/47   Pulse (!) 104   Temp (!) 97.2 F (36.2 C) (Rectal)   Resp 17   SpO2 100%   Physical Exam Vitals and nursing note reviewed.  Constitutional:      Appearance: He is well-developed and well-nourished.  HENT:     Head: Normocephalic and atraumatic.     Right Ear: External ear normal.     Left Ear: External ear normal.     Nose: Nose normal.  Eyes:     General:        Right eye: No discharge.        Left eye: No discharge.  Cardiovascular:     Rate and Rhythm: Tachycardia present. Rhythm irregular.     Heart sounds: Normal heart sounds.  Pulmonary:     Effort: Pulmonary effort is normal.     Breath sounds: Normal breath sounds.  Abdominal:     Palpations: Abdomen is soft.     Tenderness: There is no abdominal tenderness.  Musculoskeletal:        General: No edema.     Cervical back: Neck supple.  Skin:    General: Skin is warm and dry.  Neurological:     Mental Status: He is alert.     Comments: Awake, alert, oriented to person, place, day of week/month. Disoriented to year (2021). Generally weak in all 4 extremities   Psychiatric:        Mood and Affect: Mood is not anxious.     ED Results / Procedures / Treatments   Labs (all labs ordered are listed, but only abnormal results are displayed) Labs Reviewed  RESP PANEL BY RT-PCR (FLU A&B, COVID) ARPGX2  URINE CULTURE  CULTURE, BLOOD (ROUTINE X 2)  CULTURE, BLOOD (ROUTINE X 2)  COMPREHENSIVE METABOLIC PANEL  LACTIC ACID, PLASMA  LACTIC ACID, PLASMA  PROTIME-INR  URINALYSIS, ROUTINE W REFLEX MICROSCOPIC  VITAMIN B12  FOLATE  IRON AND TIBC  FERRITIN  RETICULOCYTES  CBG MONITORING, ED  TROPONIN I (HIGH SENSITIVITY)  TROPONIN I (HIGH SENSITIVITY)    EKG EKG Interpretation  Date/Time:  Tuesday August 25 2020 15:06:41 EST Ventricular Rate:  108 PR Interval:    QRS Duration: 91 QT Interval:  338 QTC Calculation: 453 R Axis:   79 Text Interpretation: Sinus tachycardia Atrial premature complexes Borderline repolarization abnormality Confirmed by Sherwood Gambler [phone]) on 08/25/2020 4:18:26 PM   Radiology DG Chest Portable 1 View  Result Date: 08/25/2020 CLINICAL DATA:  Weakness, altered mental status. EXAM: PORTABLE CHEST 1 VIEW COMPARISON:  Chest radiograph dated 07/04/2020. FINDINGS: The heart size and mediastinal contours are within normal limits. Atelectasis/scarring in the right lung base appear similar to prior exams. The left lung is clear. There is no pleural effusion or pneumothorax. Extensive osseous metastatic disease is redemonstrated. IMPRESSION: 1. No acute cardiopulmonary abnormality. 2. Extensive osseous metastatic disease. Electronically Signed   By: Zerita Boers M.D.   On: 08/25/2020 15:31    Procedures Procedures   Medications Ordered in ED Medications  sodium chloride 0.9 % bolus 500 mL (500 mLs Intravenous New Bag/Given 08/25/20 1620)  0.9 %  sodium chloride infusion (10 mL/hr Intravenous New Bag/Given 08/25/20 1619)    ED Course  I have reviewed the triage vital signs and the nursing notes.  Pertinent labs &  imaging results that were available during my care of the patient were reviewed by me and considered in my medical decision making (see chart for details).    MDM Rules/Calculators/A&P                          Patient is mildly confused on the year but overall this seems like a failure to thrive presentation.  Will get CT head, chest x-ray and labs.  His hypotension probably is from volume depletion but will send labs to eval for sepsis.  He will be given the unit of blood he was supposed to be given in oncology.  Care to Dr. Ralene Bathe.  Patient will need to be admitted. Final Clinical Impression(s) / ED Diagnoses Final diagnoses:  None    Rx / DC Orders ED Discharge Orders    None       Sherwood Gambler, MD 08/25/20 1623

## 2020-08-25 NOTE — ED Notes (Signed)
Report called and given to nurse.  

## 2020-08-25 NOTE — Progress Notes (Addendum)
Hematology and Oncology Follow Up Visit  Gregory [name] [id number] Sep 25, 1928 87 y.o. 08/25/2020 11:59 AM [name], FNPStroud, Gregory Lunch, FNP   Principle Diagnosis: 87 year old man with advanced prostate cancer with disease to the bone diagnosed in 2017.  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  Prior Therapy: He [name] status post TURP procedure and orchiectomy in February 2017. He also received prophylactic radiation to the right femoral hip for a total of 30 gray in 10 fractions in the care of Dr. Tammi Klippel completed in April 2017.  He developed castration resistant disease in July 2017.  He received Xofigo for a total of 6 treatments completed in March 2018. PSA was 5.15 in 10/26/2016 with castrate level testosterone.    Xtandi 160 mg daily started in 01/2017.  The dose was reduced to 80 mg daily since June 2019.  Therapy discontinued in 2021 after disease progression.   Current therapy: Supportive care only.  Interim History: Gregory [name] here for repeat follow-up.  Since the last visit, he reports overall generalized fatigue tiredness and overall weakness.  He [name] unable to give much history at this time but appears to be more debilitated and experiencing failure to thrive.  He [name] eating poorly and not drinking much at this time.           Medications: Unchanged on review. Current Outpatient Medications  Medication Sig Dispense Refill  . cholecalciferol (VITAMIN D3) 25 MCG (1000 UNIT) tablet Take 1,000 Units by mouth daily.    Marland Kitchen HYDROcodone-acetaminophen (NORCO) 5-325 MG tablet Take 1 tablet by mouth every 6 (six) hours as needed for moderate pain. 30 tablet 0  . metoprolol succinate (TOPROL-XL) 100 MG 24 hr tablet TAKE 1 TABLET (100 MG TOTAL) BY MOUTH DAILY. TAKE WITH OR IMMEDIATELY FOLLOWING A MEAL. (Patient taking differently: Take 100 mg by mouth daily.) 90 tablet 0  . Multiple Vitamin (MULTIVITAMIN WITH MINERALS) TABS tablet Take 1 tablet by mouth daily.     . polyethylene glycol (MIRALAX / GLYCOLAX) packet Take 17 g by mouth daily as needed for mild constipation. (Patient not taking: Reported on 07/04/2020) 14 each 0  . tamsulosin (FLOMAX) 0.4 MG CAPS capsule Take 0.4 mg by mouth daily.    Marland Kitchen triamcinolone lotion (KENALOG) 0.1 % Apply 1 application topically 3 (three) times daily. Do not use on face. (Patient taking differently: Apply 1 application topically 3 (three) times daily as needed (rash).) 120 mL 1   No current facility-administered medications for this visit.     Allergies:  Allergies  Allergen Reactions  . Covid-19 (Adenovirus) Vaccine Other (See Comments)    breakout      Physical Exam:  Blood pressure (!) 91/38, pulse 89, temperature (!) 97.5 F (36.4 C), temperature source Tympanic, resp. rate 17, height 6' (1.829 m), SpO2 100 %.   ECOG: 1    General appearance: Comfortable appearing without any discomfort Head: Normocephalic without any trauma Oropharynx: Mucous membranes are moist and pink without any thrush or ulcers. Eyes: Pupils are equal and round reactive to light. Lymph nodes: No cervical, supraclavicular, inguinal or axillary lymphadenopathy.   Heart:regular rate and rhythm.  S1 and S2 without leg edema. Lung: Clear without any rhonchi or wheezes.  No dullness to percussion. Abdomin: Soft, nontender, nondistended with good bowel sounds.  No hepatosplenomegaly. Musculoskeletal: No joint deformity or effusion.  Full range of motion noted. Neurological: No deficits noted on motor, sensory and deep tendon reflex exam. Skin: No petechial rash or dryness.  Appeared moist.        Lab Results: Lab Results  Component Value Date   WBC 8.8 08/04/2020   HGB 9.0 (L) 08/04/2020   HCT 29.1 (L) 08/04/2020   MCV 99.7 08/04/2020   PLT 140 (L) 08/04/2020     Chemistry      Component Value Date/Time   NA 140 07/04/2020 1357   NA 140 06/29/2017 1333   K 4.5 07/04/2020 1357   K 4.8 06/29/2017 1333   CL 103  07/04/2020 1357   CO2 22 01/01/2020 1229   CO2 25 06/29/2017 1333   BUN 7 (L) 07/04/2020 1357   BUN 10.2 06/29/2017 1333   CREATININE 0.60 (L) 07/04/2020 1357   CREATININE 1.25 (H) 07/25/2019 0857   CREATININE 1.1 06/29/2017 1333      Component Value Date/Time   CALCIUM 8.8 (L) 01/01/2020 1229   CALCIUM 10.1 06/29/2017 1333   ALKPHOS 143 (H) 01/01/2020 1229   ALKPHOS 57 06/29/2017 1333   AST 38 01/01/2020 1229   AST 50 (H) 07/25/2019 0857   AST 15 06/29/2017 1333   ALT 24 01/01/2020 1229   ALT 45 (H) 07/25/2019 0857   ALT 9 06/29/2017 1333   BILITOT 0.7 01/01/2020 1229   BILITOT 0.9 07/25/2019 0857   BILITOT 0.64 06/29/2017 1333       Results for Gregory [name] (MRN [id number]) as of 08/25/2020 12:00  Ref. Range 05/06/2020 10:30 05/29/2020 10:43  Prostate Specific Ag, Serum Latest Ref Range: 0.0 - 4.0 ng/mL 1,247.0 (H) 1,460.0 (H)       Impression and Plan:  87 year old man with  1.  Castration-resistant advanced prostate cancer with disease to the bone since 2017.  He [name] currently also supportive care only at this time without any additional treatment.  He has progressed on therapy as outlined above and his PSA continues to rise up to 1400 in November 2021.  The natural course of his disease was reviewed and additional treatment options such as systemic chemotherapy was reiterated.  At this time I recommended continued supportive care and reinstitute hospice given his overall decline.   2.  Anemia: Related to malignancy and disease to the bone.  He has been receiving supportive transfusion as needed.  Hemoglobin has been adequate and has not required any transfusion since December 2021.  His hemoglobin [name] low today and I recommended proceeding with transfusion which might help his symptoms.  3. Prognosis: His disease [name] incurable and overall prognosis [name] poor with limited life expectancy. I have recommended hospice and the patient was enrolled in the family decided  against it. I recommend 3 enrollment in hospice given his overall decline.  4. Follow-up: Repeat laboratory testing in a month and transfuse as needed.  30  minutes were dedicated to this visit.  The time was spent on reviewing disease status, discussing treatment options and future plan of care review.    Zola Button, MD 2/15/202211:59 AM    I was informed by the nursing staff that his mental status has declined rapidly while he [name] waiting for his transfusion. He [name] on able to to answer questions appropriately in addition to hypotension as well. Based on these findings I recommended urgent referral to the emergency department for an evaluation.  Zola Button MD   08/25/2020

## 2020-08-25 NOTE — H&P (Signed)
History and Physical    Gregory [name] NWG:[id number] DOB: January 06, 1929 DOA: 08/25/2020  PCP: [name], FNP  Patient coming from: oncology clinic  I have personally briefly reviewed patient's old medical records in [place]  Chief Complaint: anemia  HPI: Gregory [name] is Gregory [name] 87 y.o. 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   He was presenting to clinic for routine follow up.  Generally gets routine transfusions, last needed was December 2021.  He reported generalized fatigue, tiredness, and weakness.  In clinic he appeared to be debilitated and failing to thrive based on oncology's observations.  Gregory [name] told me he "felt alright".  He went to clinic for blood work.  He notes fatigue, lightheadness when sitting up.  He notes he eats very little in general.  Denies fevers, chills, chest pain, SOB.  He does not cough.  No smoking, drinking.  Patient history somewhat unreliable, his daughter notes he was previously enrolled in hospice until about Gregory [name] year ago, when he began getting regular transfusions he was discharged from hospice.  He remains Gregory [name] DNR.  She says in general he eats very little and notes that his memory is not reliable.  ED Course: Labs, imaging.  Admit for symptomatic anemia, lactic acidosis.  Review of Systems: As per HPI otherwise all other systems reviewed and are negative.   Past Medical History:  Diagnosis Date  . Bone metastases ([place])   . BPH (benign prostatic hyperplasia)   . Death of wife 55  . Foley catheter in place    Urinary Retension  . GERD (gastroesophageal reflux disease)   . Hypertension   . Prostate cancer [facility])   urologist- dr dahlstedt/  oncologist-  dr manning    Gleason 4+5,  PSA 18.74,  w/  Visceral and Extensive Bones METS--  pallitive  radiation therapy and hormone therapy    Past Surgical History:  Procedure Laterality Date  . MULTIPLE TOOTH EXTRACTIONS  1980's  . ORCHIECTOMY Bilateral 08/27/2015   Procedure: ORCHIECTOMY;  Surgeon: Franchot Gallo, MD;  Location: [facility];  Service: Urology;  Laterality: Bilateral;  . PROSTATE BIOPSY  1/16 /2017  . TRANSURETHRAL RESECTION OF PROSTATE N/[name] 08/27/2015   Procedure: TRANSURETHRAL RESECTION OF THE PROSTATE WITH GYRUS INSTRUMENTS;  Surgeon: Franchot Gallo, MD;  Location: [facility];  Service: Urology;  Laterality: N/[name];    Social History  reports that he has quit smoking. His smoking use included cigarettes. He started smoking about 28 years ago. He has [name] 7.50 pack-year smoking history. He has never used smokeless tobacco. He reports that he does not drink alcohol and does not use drugs.  Allergies  Allergen Reactions  . Covid-19 (Adenovirus) Vaccine Other (See Comments)    breakout    Family History  Problem Relation Age of Onset  . Tuberculosis Mother 34  . Other Father 64    Prior to Admission medications   Medication Sig Start Date End Date Taking? Authorizing Provider  cholecalciferol (VITAMIN D3) 25 MCG (1000 UNIT) tablet Take 1,000 Units by mouth daily.   Yes [provider]  HYDROcodone-acetaminophen (NORCO) 5-325 MG tablet Take 1 tablet by mouth every 6 (six) hours as needed for moderate pain. 06/23/20  Yes Wyatt Portela, MD  metoprolol succinate (TOPROL-XL) 100 MG 24 hr tablet TAKE 1 TABLET (100 MG  TOTAL) BY MOUTH DAILY. TAKE WITH OR IMMEDIATELY FOLLOWING Gregory [name] MEAL. Patient taking differently: Take 100 mg by mouth daily. 09/24/19  Yes [name], FNP  Multiple Vitamin (MULTIVITAMIN WITH MINERALS) TABS tablet Take 1 tablet by mouth daily.   Yes [provider]  tamsulosin (FLOMAX) 0.4 MG CAPS capsule Take 0.4 mg by mouth daily. 01/16/20  Yes [provider]  triamcinolone lotion (KENALOG) 0.1 %  Apply 1 application topically 3 (three) times daily. Do not use on face. Patient taking differently: Apply 1 application topically 3 (three) times daily as needed (rash). 05/29/20  Yes Tanner, Lyndon Code., PA-C  polyethylene glycol (MIRALAX / GLYCOLAX) packet Take 17 g by mouth daily as needed for mild constipation. Patient not taking: No sig reported 09/05/15   Geradine Girt, DO    Physical Exam: Vitals:   08/25/20 1530 08/25/20 1634 08/25/20 1644 08/25/20 1652  BP: (!) 108/47 113/67 113/67 120/62  Pulse:  94 75 89  Resp: 17 19 19 15   Temp:  (!) 97.5 F (36.4 C) (!) 97.5 F (36.4 C) (!) 97.5 F (36.4 C)  TempSrc:  Oral Oral Oral  SpO2:   95% 95%    Constitutional: NAD, calm, comfortable cachetic, chronically ill appearing Vitals:   08/25/20 1530 08/25/20 1634 08/25/20 1644 08/25/20 1652  BP: (!) 108/47 113/67 113/67 120/62  Pulse:  94 75 89  Resp: 17 19 19 15   Temp:  (!) 97.5 F (36.4 C) (!) 97.5 F (36.4 C) (!) 97.5 F (36.4 C)  TempSrc:  Oral Oral Oral  SpO2:   95% 95%   Eyes: PERRL, lids and conjunctivae normal ENMT: Mucous membranes are moist.  Neck: normal, supple, no masses, no thyromegaly Respiratory: clear to auscultation bilaterally, no wheezing, no crackles. Normal respiratory effort.  Cardiovascular: Regular rate and rhythm, no murmurs / rubs / gallops. No extremity edema. Abdomen: no tenderness, no masses palpated. No hepatosplenomegaly. Bowel sounds positive.  Musculoskeletal: no clubbing / cyanosis. No joint deformity upper and lower extremities. Good ROM, no contractures. Normal muscle tone.  Skin: no rashes, lesions, ulcers. No induration Neurologic: CN 2-12 grossly intact. Sensation intact.  Moving all extremities.  Psychiatric: Normal judgment and insight. Alert and oriented x 2-3. Normal mood.   Labs on Admission: I have personally reviewed following labs and imaging studies  CBC: Recent Labs  Lab 08/25/20 1203  WBC 9.1  NEUTROABS 4.3  HGB 7.4*   HCT 22.9*  MCV 101.8*  PLT 90*    Basic Metabolic Panel: Recent Labs  Lab 08/25/20 1530  NA 141  K 3.7  CL 101  CO2 24  GLUCOSE 141*  BUN 10  CREATININE 0.77  CALCIUM 8.9    GFR: CrCl cannot be calculated (Unknown ideal weight.).  Liver Function Tests: Recent Labs  Lab 08/25/20 1530  AST 41  ALT 19  ALKPHOS 471*  BILITOT 1.6*  PROT 6.9  ALBUMIN 2.7*    Urine analysis:    Component Value Date/Time   COLORURINE YELLOW 07/04/2020 1007   APPEARANCEUR CLEAR 07/04/2020 1007   LABSPEC 1.005 07/04/2020 1007   PHURINE 6.0 07/04/2020 1007   GLUCOSEU NEGATIVE 07/04/2020 1007   HGBUR NEGATIVE 07/04/2020 1007   BILIRUBINUR NEGATIVE 07/04/2020 1007   BILIRUBINUR Negative 07/01/2019 1339   KETONESUR NEGATIVE 07/04/2020 1007   PROTEINUR NEGATIVE 07/04/2020 1007   UROBILINOGEN 0.2 07/01/2019 1339   UROBILINOGEN 0.2 03/27/2017 1327   NITRITE NEGATIVE 07/04/2020 1007   LEUKOCYTESUR NEGATIVE 07/04/2020 1007    Radiological  Exams on Admission: CT Head Wo Contrast  Result Date: 08/25/2020 CLINICAL DATA:  Altered mental status. History of metastatic prostate cancer. EXAM: CT HEAD WITHOUT CONTRAST TECHNIQUE: Contiguous axial images were obtained from the base of the skull through the vertex without intravenous contrast. COMPARISON:  July 04, 2020. FINDINGS: Brain: Diffuse cortical atrophy is again noted. There is interval development of mild to moderate size bilateral subdural hygromas with multiple dural-based nodular densities consistent with metastatic disease, which is significantly progressed since prior exam. At least 1 such lesion is seen involving the right para falcine region near the vertex of the skull. No hemorrhage or acute infarction is noted. No midline shift is noted. No hemorrhage is noted. Vascular: No hyperdense vessel or unexpected calcification. Skull: Sclerotic densities are noted in the skull base consistent with metastatic disease. Sinuses/Orbits: Right  sphenoid sinusitis is noted. Other: Fluid is noted in the left mastoid air cells. IMPRESSION: 1. Interval development of mild to moderate size bilateral subdural hygromas with multiple dural-based nodular densities consistent with metastatic disease, which is significantly progressed since prior exam. 2. Sclerotic densities are noted in the skull base consistent with metastatic disease. 3. Right sphenoid sinusitis. 4. Fluid is noted in the left mastoid air cells. Electronically Signed   By: Marijo Conception M.D.   On: 08/25/2020 16:29   DG Chest Portable 1 View  Result Date: 08/25/2020 CLINICAL DATA:  Weakness, altered mental status. EXAM: PORTABLE CHEST 1 VIEW COMPARISON:  Chest radiograph dated 07/04/2020. FINDINGS: The heart size and mediastinal contours are within normal limits. Atelectasis/scarring in the right lung base appear similar to prior exams. The left lung is clear. There is no pleural effusion or pneumothorax. Extensive osseous metastatic disease is redemonstrated. IMPRESSION: 1. No acute cardiopulmonary abnormality. 2. Extensive osseous metastatic disease. Electronically Signed   By: Zerita Boers M.D.   On: 08/25/2020 15:31    EKG: Independently reviewed. Sinus tach, PAC's, t wave flattening, inversion in V3-4.  Q in avl, old.   Assessment/Plan Active Problems:   AMS (altered mental status)  Symptomatic Anemia  Anemia of chronic disease  Hypotension: pt developed hypotension, paleness, shortness of breath, lethargy, dizziness after being brought to infusion area from oncology clinic.  Suspect this is 2/2 symptomatic anemia.   Transfuse 1 unit pRBC Bolus 1 L LR hypoproliferative retics Normal folate. Follow b12, ferritin, iron.  Anemia related to malignancy and disease to bone - has been getting supportive transfusion as needed  Lactic Acidosis: suspect 2/2 hypovolemia in setting of symptomatic anemia and poor appetite Follow with transfusion and IVF Follow blood and urine  cultures Hold off on antibiotics for now  Acute Hypoxic Respiratory Failure: currently on 2 L [place], suspect this is related to anemia/for comfort in setting of dyspnea from above.  CXR without acute abnormalities.  Follow chest CT.  Wean o2 as able.  Work up additionally as indicated.  Cognitive Impairment:  Gregory [name]&Ox2-3, unreliable historian.  Seems to be close to his baseline.  Suspect may have component of dementia.  Head CT with evidence of metastatic disease as noted below. 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 Alen Blew recommending hospice given continued decline  Thrombocytopenia: 2/2 malignancy, follow  Poor Appetite: likely 2/2 disease above, follow  Goals of Care: he's previously been enrolled in hospice.  Sounds like when he started getting his ~monthly transfusions he transitioned off hospice.  Daughter confirms his DNR.  They're open to discussion again about hospice and overall goals of care.  Elevated Alk Phos: likely 2/2 disease to bone, follow  Elevated troponin: mild, follow repeat, no CP.  EKG with t wave flattening, inversion in V3-4.  Low suspicion for ACS, likely demand related.   BPH: continue flomax  HTN: hold metoprolol   DVT prophylaxis: SCD Code Status:   DNR  Family Communication:  Paulette  Disposition Plan:   Patient is from:  home  Anticipated DC to:  pending  Anticipated DC date:  pending  Anticipated DC barriers: Pending improvement in hypotension, Hb, symptoms - goals of care discussions  Consults called:  Palliative care c/s entered, Shadad added to treatment team (no consults called tonight, can call tomorrow)  Admission status:   inpatient  Severity of Illness: The appropriate patient status for this patient is INPATIENT. Inpatient status is judged to be reasonable and necessary in order to provide the required intensity of service to ensure the patient's safety. The patient's presenting symptoms, physical exam findings, and initial radiographic and laboratory data in the context of their chronic comorbidities is felt to place them at high risk for further clinical deterioration. Furthermore, it is not anticipated that the patient will be medically stable for discharge from the hospital within 2 midnights of admission. The following factors support the patient status of inpatient.   " The patient's presenting symptoms include hypotension, shortness of breath. " The worrisome physical exam findings include hypotension, pale. " The initial radiographic and laboratory data are worrisome because of anemia, lactic acidosis. " The chronic co-morbidities include prostate cancer.   * I certify that at the point of admission it is my clinical judgment that the patient will require inpatient hospital care spanning beyond 2 midnights from the point of admission due to high intensity of service, high risk for further deterioration and high frequency of surveillance required.Fayrene Helper MD [facility]  How to contact the [facility] Attending or Consulting provider [place] or covering provider during after hours [place], for this patient?   1. Check the care team in [facility] and look for Yanni Quiroa) attending/consulting [facility] provider listed and b) the [facility] team listed 2. Log into [email or web address] and use [place]'s universal password to access. If you do not have the password, please contact the hospital operator. 3. Locate the [facility] provider you are looking for under [facility] and page to Anaka Beazer number that you can be directly reached. 4. If you still have difficulty reaching the provider, please page the [facility] (Director on Call) for  the Hospitalists listed on amion for assistance.  08/25/2020, 6:27 PM

## 2020-08-26 ENCOUNTER — Inpatient Hospital Stay (HOSPITAL_COMMUNITY): Payer: Medicare Other

## 2020-08-26 ENCOUNTER — Encounter (HOSPITAL_COMMUNITY): Payer: Self-pay | Admitting: Family Medicine

## 2020-08-26 DIAGNOSIS — R4182 Altered mental status, unspecified: Secondary | ICD-10-CM

## 2020-08-26 DIAGNOSIS — Z515 Encounter for palliative care: Secondary | ICD-10-CM

## 2020-08-26 DIAGNOSIS — Z7189 Other specified counseling: Secondary | ICD-10-CM

## 2020-08-26 DIAGNOSIS — R531 Weakness: Secondary | ICD-10-CM

## 2020-08-26 DIAGNOSIS — C61 Malignant neoplasm of prostate: Secondary | ICD-10-CM

## 2020-08-26 LAB — MAGNESIUM: Magnesium: 1.7 mg/dL (ref 1.7–2.4)

## 2020-08-26 LAB — COMPREHENSIVE METABOLIC PANEL
ALT: 14 U/L (ref 0–44)
AST: 30 U/L (ref 15–41)
Albumin: 2.1 g/dL — ABNORMAL LOW (ref 3.5–5.0)
Alkaline Phosphatase: 336 U/L — ABNORMAL HIGH (ref 38–126)
Anion gap: 11 (ref 5–15)
BUN: 8 mg/dL (ref 8–23)
CO2: 23 mmol/L (ref 22–32)
Calcium: 8.1 mg/dL — ABNORMAL LOW (ref 8.9–10.3)
Chloride: 105 mmol/L (ref 98–111)
Creatinine, Ser: 0.73 mg/dL (ref 0.61–1.24)
GFR, Estimated: >60 mL/min (ref >60)
Glucose, Bld: 92 mg/dL (ref 70–99)
Potassium: 3.2 mmol/L — ABNORMAL LOW (ref 3.5–5.1)
Sodium: 139 mmol/L (ref 135–145)
Total Bilirubin: 1 mg/dL (ref 0.3–1.2)
Total Protein: 5.2 g/dL — ABNORMAL LOW (ref 6.5–8.1)

## 2020-08-26 LAB — CBC
HCT: 23.8 % — ABNORMAL LOW (ref 39.0–52.0)
Hemoglobin: 7.6 g/dL — ABNORMAL LOW (ref 13.0–17.0)
MCH: 31.9 pg (ref 26.0–34.0)
MCHC: 31.9 g/dL (ref 30.0–36.0)
MCV: 100 fL (ref 80.0–100.0)
Platelets: 59 10*3/uL — ABNORMAL LOW (ref 150–400)
RBC: 2.38 MIL/uL — ABNORMAL LOW (ref 4.22–5.81)
RDW: 21.3 % — ABNORMAL HIGH (ref 11.5–15.5)
WBC: 5.4 10*3/uL (ref 4.0–10.5)
nRBC: 1.9 % — ABNORMAL HIGH (ref 0.0–0.2)

## 2020-08-26 LAB — LACTIC ACID, PLASMA
Lactic Acid, Venous: 1.2 mmol/L (ref 0.5–1.9)
Lactic Acid, Venous: 2 mmol/L (ref 0.5–1.9)

## 2020-08-26 MED ORDER — GLYCOPYRROLATE 0.2 MG/ML IJ SOLN
0.1000 mg | Freq: Three times a day (TID) | INTRAMUSCULAR | Status: DC | PRN
  Filled 2020-08-26: qty 0.5

## 2020-08-26 MED ORDER — LACTATED RINGERS IV BOLUS
500.0000 mL | Freq: Once | INTRAVENOUS | Status: AC
  Administered 2020-08-26: 500 mL via INTRAVENOUS

## 2020-08-26 MED ORDER — METOPROLOL SUCCINATE ER 50 MG PO TB24
50.0000 mg | ORAL_TABLET | Freq: Every day | ORAL | Status: DC
  Administered 2020-08-26 – 2020-08-27 (×2): 50 mg via ORAL
  Filled 2020-08-26 (×2): qty 1

## 2020-08-26 MED ORDER — METOPROLOL TARTRATE 5 MG/5ML IV SOLN
5.0000 mg | Freq: Once | INTRAVENOUS | Status: AC
  Administered 2020-08-26: 5 mg via INTRAVENOUS
  Filled 2020-08-26 (×2): qty 5

## 2020-08-26 MED ORDER — MORPHINE SULFATE (PF) 2 MG/ML IV SOLN: 2.0000 mg | INTRAVENOUS | Status: DC | PRN

## 2020-08-26 MED ORDER — POTASSIUM CHLORIDE CRYS ER 20 MEQ PO TBCR
40.0000 meq | EXTENDED_RELEASE_TABLET | Freq: Once | ORAL | Status: AC
  Administered 2020-08-26: 40 meq via ORAL
  Filled 2020-08-26: qty 2

## 2020-08-26 MED ORDER — GLYCOPYRROLATE 0.2 MG/ML IJ SOLN
0.1000 mg | Freq: Three times a day (TID) | INTRAMUSCULAR | Status: DC
  Filled 2020-08-26: qty 0.5

## 2020-08-26 NOTE — Progress Notes (Signed)
Manufacturing engineer [facility]) Hospital Liaison Note:  Received request for family interest in [facility]. Chart reviewed and spoke with family to acknowledge referral.    Unfortunately [facility] is not able to offer a room today. Family and CSW are aware Manufacturing engineer liaison will follow up with CSW and family tomorrow or sooner if room becomes available.   Please do not hesitate to call with questions.  Thank you.   Gar Ponto, RN [facility] Liaison  [phone]

## 2020-08-26 NOTE — Progress Notes (Signed)
CRITICAL VALUE ALERT  Critical Value:  Lactic Acid 2.0  Date & Time Notied:  08/26/2020 1200  Provider Notified: Dr. Roger Shelter   Orders Received/Actions taken:

## 2020-08-26 NOTE — Progress Notes (Signed)
Nutrition Brief Note  Patient screened for MST and for consult for malnutrition evaluation. BMI indicates underweight status. Chart reviewed. Patient seen by Palliative Care who was able to talk with patient and his family earlier today.  Patient now transitioning to comfort care and d/c plan is for residential hospice.    No further nutrition interventions warranted at this time. Please re-consult as needed.      Jarome Matin, MS, RD, LDN, CNSC Inpatient Clinical Dietitian RD pager # available in [place]  After hours/weekend pager # available in [facility]

## 2020-08-26 NOTE — Progress Notes (Addendum)
PROGRESS NOTE    Patient: Gregory [name]                            PCP: [name], FNP                    DOB: [birth year]            DOA: 08/25/2020 [id number]             DOS: 08/26/2020, 10:01 AM   LOS: 1 day   Date of Service: The patient was seen and examined on 08/26/2020  Subjective:   The patient was seen and examined this morning. Stable at this time. Awake alert confused Otherwise no issues overnight .  Brief Narrative:   Gregory [name] is a 87 y.o. 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   Generally gets routine transfusions, last needed was December 2021.. On admission hemoglobin 7.4, lactic acid 5.3 (admitted for symptomatic anemia, lactic acidosis) It appears patient was on hospice previously since initiated blood transfusion has been discharged from hospice. Currently DNR    Assessment & Plan:   Active Problems:   AMS (altered mental status)    Symptomatic Anemia  Anemia of chronic disease  Hypotension: pt developed hypotension,  -Hemodynamically stable this morning, BP 113/72 paleness, shortness of breath, lethargy, dizziness after being brought to infusion area from oncology clinic.  Suspect this is 2/2 symptomatic anemia.   Transfuse 1 unit pRBC on 08/25/2020 -Hemoglobin 7.4, 7.6 today -Status post IV fluid  - bolus 1 L LR hypoproliferative retics Normal folate. Follow b12, ferritin, iron.  Anemia related to malignancy and disease to bone - has been getting supportive transfusion as needed  Lactic Acidosis:  suspect 2/2 hypovolemia in setting of symptomatic anemia and poor appetite Follow with transfusion and IVF --- H&H and blood pressure has improved -Lactic acid 5.3, 2.3, 1.3 today  Follow blood and urine cultures Hold off on  antibiotics for now  Acute Hypoxic Respiratory Failure: -Much improved -Remains on 2 L of oxygen, satting 100% suspect this is related to anemia/for comfort in setting of dyspnea from above.  CXR without acute abnormalities.  Follow chest CT.  Wean o2 as able.  Work up additionally as indicated.  Cognitive Impairment:  -Confused alert oriented x2 - unreliable historian.  Seems to be close to his baseline.  Suspect may have component of dementia.  Head CT with evidence of metastatic disease as noted below. 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 Alen Blew recommending hospice given continued decline  Thrombocytopenia: 2/2 malignancy, follow  Poor Appetite: likely 2/2 disease above, follow  Goals of Care: he's previously been enrolled in hospice.  Sounds like when he started getting his ~monthly transfusions he transitioned off hospice.  Daughter confirms his DNR.  They're open to discussion again about hospice and overall goals of care.  Elevated Alk Phos: likely 2/2 disease to bone, follow  Elevated troponin: mild, follow repeat, no CP.  EKG with t wave flattening, inversion in V3-4.  Low suspicion  for ACS, likely demand related.   BPH: continue flomax  HTN: hold metoprolol   DVT prophylaxis:      SCD Code Status:              DNR  Family Communication:       Paulette  Disposition Plan:              Patient is from:                        home             Anticipated DC to:                   pending             Anticipated DC date:               pending             Anticipated DC barriers:         Pending improvement in hypotension, Hb, symptoms - goals of care  discussions      Consults called:        Palliative care c/s entered, Shadad added to treatment team  Admission status:      Status is: Inpatient  Level of care: Progressive    ------------------------------------------------------------------------------------------------------------------------------------ Cultures; Blood Cultures x 2 >> NGT Urine Culture  >>> NGT    Antimicrobials: None    Consultants: Heme oncologist/social worker   -----------------------------------------------------------------------------------------------------------------------------------   Procedures:   No admission procedures for hospital encounter.    Antimicrobials:  Anti-infectives (From admission, onward)   None       Medication:  . tamsulosin  0.4 mg Oral Daily    acetaminophen **OR** acetaminophen   Objective:   Vitals:   08/26/20 0101 08/26/20 0500 08/26/20 0616 08/26/20 0753  BP: (!) 112/58  101/62 113/72  Pulse: 79  91   Resp: 20   20  Temp: 98.2 F (36.8 C)  98.1 F (36.7 C) 97.6 F (36.4 C)  TempSrc:   Oral Oral  SpO2: 100%  100% 100%  Weight:  61.8 kg    Height:        Intake/Output Summary (Last 24 hours) at 08/26/2020 1001 Last data filed at 08/26/2020 1287 Gross per 24 hour  Intake 3080 ml  Output 650 ml  Net 2430 ml   Filed Weights   08/25/20 2220 08/26/20 0500  Weight: 61.8 kg 61.8 kg     Examination:   Physical Exam  Constitution:  Alert, very confused, comfortable in bed Psychiatric: Normal and stable mood and affect, cognition intact,   HEENT: Normocephalic, PERRL, otherwise with in Normal limits  Chest:Chest symmetric Cardio vascular:  S1/S2, RRR, No murmure, No Rubs or Gallops  pulmonary: Clear to auscultation bilaterally, respirations unlabored, negative wheezes / crackles Abdomen: Soft, non-tender, non-distended, bowel sounds,no masses, no organomegaly Muscular skeletal:  Generalized weaknesses, Limited exam - in bed, able to move  all 4 extremities, Normal strength,  Neuro: Limited exam-CNII-XII intact. , normal motor and sensation, reflexes intact  Extremities: No pitting edema lower extremities, +2 pulses  Skin: Dry, warm to touch, negative for any Rashes, No open wounds Wounds: per nursing documentation    ------------------------------------------------------------------------------------------------------------------------------------------    LABs:  CBC Latest Ref Rng & Units 08/26/2020 08/25/2020 08/04/2020  WBC 4.0 - 10.5 K/uL 5.4 9.1 8.8  Hemoglobin 13.0 - 17.0 g/dL 7.6(L) 7.4(L) 9.0(L)  Hematocrit 39.0 - 52.0 %  23.8(L) 22.9(L) 29.1(L)  Platelets 150 - 400 K/uL 59(L) 90(L) 140(L)   CMP Latest Ref Rng & Units 08/26/2020 08/25/2020 07/04/2020  Glucose 70 - 99 mg/dL 92 141(H) 93  BUN 8 - 23 mg/dL 8 10 7(L)  Creatinine 0.61 - 1.24 mg/dL 0.73 0.77 0.60(L)  Sodium 135 - 145 mmol/L 139 141 140  Potassium 3.5 - 5.1 mmol/L 3.2(L) 3.7 4.5  Chloride 98 - 111 mmol/L 105 101 103  CO2 22 - 32 mmol/L 23 24 -  Calcium 8.9 - 10.3 mg/dL 8.1(L) 8.9 -  Total Protein 6.5 - 8.1 g/dL 5.2(L) 6.9 -  Total Bilirubin 0.3 - 1.2 mg/dL 1.0 1.6(H) -  Alkaline Phos 38 - 126 U/L 336(H) 471(H) -  AST 15 - 41 U/L 30 41 -  ALT 0 - 44 U/L 14 19 -       Micro Results Recent Results (from the past 240 hour(s))  Culture, blood (routine x 2)     Status: None (Preliminary result)   Collection Time: 08/25/20  3:30 PM   Specimen: BLOOD  Result Value Ref Range Status   Specimen Description   Final    BLOOD RIGHT ANTECUBITAL Performed at [facility], [place] [address], [place], [place] [address]    Special Requests   Final    BOTTLES DRAWN AEROBIC AND ANAEROBIC Blood Culture adequate volume Performed at [facility] [address], [place], [place] [address]    Culture   Final    NO GROWTH < 12 HOURS Performed at [facility] [address], [place], [place] [address]    Report Status  PENDING  Incomplete  Resp Panel by RT-PCR (Flu A&B, Covid) Nasopharyngeal Swab     Status: None   Collection Time: 08/25/20  4:55 PM   Specimen: Nasopharyngeal Swab; Nasopharyngeal(NP) swabs in vial transport medium  Result Value Ref Range Status   SARS Coronavirus 2 by RT PCR NEGATIVE NEGATIVE Final    Comment: (NOTE) SARS-CoV-2 target nucleic acids are NOT DETECTED.  The SARS-CoV-2 RNA is generally detectable in upper respiratory specimens during the acute phase of infection. The lowest concentration of SARS-CoV-2 viral copies this assay can detect is 138 copies/mL. A negative result does not preclude SARS-Cov-2 infection and should not be used as the sole basis for treatment or other patient management decisions. A negative result may occur with  improper specimen collection/handling, submission of specimen other than nasopharyngeal swab, presence of viral mutation(s) within the areas targeted by this assay, and inadequate number of viral copies(<138 copies/mL). A negative result must be combined with clinical observations, patient history, and epidemiological information. The expected result is Negative.  Fact Sheet for Patients:  EntrepreneurPulse.com.au  Fact Sheet for Healthcare Providers:  IncredibleEmployment.be  This test is no t yet approved or cleared by the [place] FDA and  has been authorized for detection and/or diagnosis of SARS-CoV-2 by FDA under an Emergency Use Authorization (EUA). This EUA will remain  in effect (meaning this test can be used) for the duration of the COVID-19 declaration under Section 564(b)(1) of the Act, 21 U.S.C.section 360bbb-3(b)(1), unless the authorization is terminated  or revoked sooner.       Influenza A by PCR NEGATIVE NEGATIVE Final   Influenza B by PCR NEGATIVE NEGATIVE Final    Comment: (NOTE) The Xpert Xpress SARS-CoV-2/FLU/RSV plus assay is intended as an aid in the diagnosis of  influenza from Nasopharyngeal swab specimens and should not be used as  a sole basis for treatment. Nasal washings and aspirates are unacceptable for Xpert Xpress SARS-CoV-2/FLU/RSV testing.  Fact Sheet for Patients: EntrepreneurPulse.com.au  Fact Sheet for Healthcare Providers: IncredibleEmployment.be  This test is not yet approved or cleared by the [place] FDA and has been authorized for detection and/or diagnosis of SARS-CoV-2 by FDA under an Emergency Use Authorization (EUA). This EUA will remain in effect (meaning this test can be used) for the duration of the COVID-19 declaration under Section 564(b)(1) of the Act, 21 U.S.C. section 360bbb-3(b)(1), unless the authorization is terminated or revoked.  Performed at [facility], [place] [address], [place], [place] [address]   Culture, blood (routine x 2)     Status: None (Preliminary result)   Collection Time: 08/25/20  6:00 PM   Specimen: BLOOD  Result Value Ref Range Status   Specimen Description   Final    BLOOD BLOOD LEFT ARM Performed at [facility] [address] [place], [place] [address]    Special Requests   Final    BOTTLES DRAWN AEROBIC AND ANAEROBIC Blood Culture results may not be optimal due to an excessive volume of blood received in culture bottles Performed at [facility] [address], [place], [place] [address]    Culture   Final    NO GROWTH < 12 HOURS Performed at [facility] [address], [place], [place] [address]    Report Status PENDING  Incomplete    Radiology Reports CT Head Wo Contrast  Result Date: 08/25/2020 CLINICAL DATA:  Altered mental status. History of metastatic prostate cancer. EXAM: CT HEAD WITHOUT CONTRAST TECHNIQUE: Contiguous axial images were obtained from the base of the skull through the vertex without intravenous contrast. COMPARISON:  July 04, 2020. FINDINGS:  Brain: Diffuse cortical atrophy is again noted. There is interval development of mild to moderate size bilateral subdural hygromas with multiple dural-based nodular densities consistent with metastatic disease, which is significantly progressed since prior exam. At least 1 such lesion is seen involving the right para falcine region near the vertex of the skull. No hemorrhage or acute infarction is noted. No midline shift is noted. No hemorrhage is noted. Vascular: No hyperdense vessel or unexpected calcification. Skull: Sclerotic densities are noted in the skull base consistent with metastatic disease. Sinuses/Orbits: Right sphenoid sinusitis is noted. Other: Fluid is noted in the left mastoid air cells. IMPRESSION: 1. Interval development of mild to moderate size bilateral subdural hygromas with multiple dural-based nodular densities consistent with metastatic disease, which is significantly progressed since prior exam. 2. Sclerotic densities are noted in the skull base consistent with metastatic disease. 3. Right sphenoid sinusitis. 4. Fluid is noted in the left mastoid air cells. Electronically Signed   By: Marijo Conception M.D.   On: 08/25/2020 16:29   DG Chest Portable 1 View  Result Date: 08/25/2020 CLINICAL DATA:  Weakness, altered mental status. EXAM: PORTABLE CHEST 1 VIEW COMPARISON:  Chest radiograph dated 07/04/2020. FINDINGS: The heart size and mediastinal contours are within normal limits. Atelectasis/scarring in the right lung base appear similar to prior exams. The left lung is clear. There is no pleural effusion or pneumothorax. Extensive osseous metastatic disease is redemonstrated. IMPRESSION: 1. No acute cardiopulmonary abnormality. 2. Extensive osseous metastatic disease. Electronically Signed   By: Zerita Boers M.D.   On: 08/25/2020 15:31    SIGNED: Deatra James, MD, FHM. [facility],  Pager (please use [email or web address] to page/text) Please use Epic Secure  Chat for non-urgent  communication (7AM-7PM)  If 7PM-7AM, please contact night-coverage [email or web address], 08/26/2020, 10:01 AM

## 2020-08-26 NOTE — Progress Notes (Signed)
PROGRESS NOTE    Patient: Gregory [name]                            PCP: [name], FNP                    DOB: Jan 13, 1929            DOA: 08/25/2020 [id number]             DOS: 08/26/2020, 3:30 PM   LOS: 1 day   Date of Service: The patient was seen and examined on 08/26/2020  Subjective:   Patient remained confused.  All labs and images including new CT of the head, CT of chest was reviewed discussed with radiologist... Findings are consistent with metastatic cancer   Assessment & Plan:   Active Problems:   Malignant neoplasm prostate ([place])   Debility   Metastatic adenocarcinoma to bone ([facility])   Prostate cancer ([place])   Diastolic dysfunction with acute on chronic heart failure ([facility])   Symptomatic anemia   AMS (altered mental status)    Code Status:   Code Status: DNR  Family Communication: Daughter Ananth Fiallos [phone]  All findings, including imaging new findings were discussed with the patient's daughter Ms. Benson Setting. Palliative care Dr. Rowe Pavy also was able to reach family and discussed the matter in detail. Patient's daughter expressed understanding, and is agreeable to comfort care measures..  Patient will be transitioning to hospice.   CODE STATUS confirmed-DNR/DNI  Objective:   Vitals:   08/26/20 0616 08/26/20 0753 08/26/20 1042 08/26/20 1435  BP: 101/62 113/72  109/62  Pulse: 91  (!) 144 93  Resp:  20    Temp: 98.1 F (36.7 C) 97.6 F (36.4 C)  97.6 F (36.4 C)  TempSrc: Oral Oral  Axillary  SpO2: 100% 100%  95%  Weight:      Height:        Intake/Output Summary (Last 24 hours) at 08/26/2020 1530 Last data filed at 08/26/2020 1440 Gross per 24 hour  Intake 3680 ml  Output 1450 ml  Net 2230 ml   Filed Weights   08/25/20 2220 08/26/20 0500  Weight: 61.8 kg 61.8 kg   ------------------------------------------------------------------------------------------------------------------------------------------    LABs:   CBC Latest Ref Rng & Units 08/26/2020 08/25/2020 08/04/2020  WBC 4.0 - 10.5 K/uL 5.4 9.1 8.8  Hemoglobin 13.0 - 17.0 g/dL 7.6(L) 7.4(L) 9.0(L)  Hematocrit 39.0 - 52.0 % 23.8(L) 22.9(L) 29.1(L)  Platelets 150 - 400 K/uL 59(L) 90(L) 140(L)   CMP Latest Ref Rng & Units 08/26/2020 08/25/2020 07/04/2020  Glucose 70 - 99 mg/dL 92 141(H) 93  BUN 8 - 23 mg/dL 8 10 7(L)  Creatinine 0.61 - 1.24 mg/dL 0.73 0.77 0.60(L)  Sodium 135 - 145 mmol/L 139 141 140  Potassium 3.5 - 5.1 mmol/L 3.2(L) 3.7 4.5  Chloride 98 - 111 mmol/L 105 101 103  CO2 22 - 32 mmol/L 23 24 -  Calcium 8.9 - 10.3 mg/dL 8.1(L) 8.9 -  Total Protein 6.5 - 8.1 g/dL 5.2(L) 6.9 -  Total Bilirubin 0.3 - 1.2 mg/dL 1.0 1.6(H) -  Alkaline Phos 38 - 126 U/L 336(H) 471(H) -  AST 15 - 41 U/L 30 41 -  ALT 0 - 44 U/L 14 19 -       Radiology Reports CT Head Wo Contrast  Result Date: 08/25/2020 CLINICAL DATA:  Altered mental status. History of metastatic prostate cancer. EXAM:  CT HEAD WITHOUT CONTRAST TECHNIQUE: Contiguous axial images were obtained from the base of the skull through the vertex without intravenous contrast. COMPARISON:  July 04, 2020. FINDINGS: Brain: Diffuse cortical atrophy is again noted. There is interval development of mild to moderate size bilateral subdural hygromas with multiple dural-based nodular densities consistent with metastatic disease, which is significantly progressed since prior exam. At least 1 such lesion is seen involving the right para falcine region near the vertex of the skull. No hemorrhage or acute infarction is noted. No midline shift is noted. No hemorrhage is noted. Vascular: No hyperdense vessel or unexpected calcification. Skull: Sclerotic densities are noted in the skull base consistent with metastatic disease. Sinuses/Orbits: Right sphenoid sinusitis is noted. Other: Fluid is noted in the left mastoid air cells. IMPRESSION: 1. Interval development of mild to moderate size bilateral subdural  hygromas with multiple dural-based nodular densities consistent with metastatic disease, which is significantly progressed since prior exam. 2. Sclerotic densities are noted in the skull base consistent with metastatic disease. 3. Right sphenoid sinusitis. 4. Fluid is noted in the left mastoid air cells. Electronically Signed   By: Marijo Conception M.D.   On: 08/25/2020 16:29   CT CHEST WO CONTRAST  Result Date: 08/26/2020 CLINICAL DATA:  Chest pain, shortness of breath, cough; history prostate cancer with osseous metastases, hypertension, GERD EXAM: CT CHEST WITHOUT CONTRAST TECHNIQUE: Multidetector CT imaging of the chest was performed following the standard protocol without IV contrast. Sagittal and coronal MPR images reconstructed from axial data set. COMPARISON:  Chest radiograph 08/25/2020; CT abdomen and pelvis 01/18/2019 FINDINGS: Cardiovascular: Atherosclerotic calcifications aorta, proximal great vessels, minimally in coronary arteries. Aneurysmal dilatation ascending thoracic aorta 4.1 cm transverse image 93. Heart unremarkable. No pericardial effusion. Mediastinum/Nodes: Esophagus unremarkable. Base of cervical region normal appearance. Few scattered normal size mediastinal lymph nodes without thoracic adenopathy. Lungs/Pleura: Small BILATERAL pleural effusions larger on RIGHT. Emphysematous changes and scarring at apices. Compressive atelectasis of lower lobes. Multiple radiodensities in the RIGHT lower lobe, could represent old granulomatous disease or aspirated contrast. No acute infiltrate or pneumothorax. Questionable nodular density 5 mm diameter LEFT upper lobe image 71. Upper Abdomen: Numerous ill-defined nodular densities throughout the liver consistent with widespread hepatic metastatic disease. These are new since 01/18/2019. Stomach distended by food debris. LEFT adrenal mass 2.6 x 2.3 cm new since prior exam consistent with metastasis. Few enlarged lymph nodes in gastrohepatic ligament.  Musculoskeletal: Widespread sclerotic osseous metastases. IMPRESSION: Small BILATERAL pleural effusions larger on RIGHT with compressive atelectasis of lower lobes. Questionable 5 mm LEFT upper lobe nodule. LEFT adrenal and RIGHT spread hepatic metastases, new; these could be due to prostate cancer or to a second primary. Widespread osseous metastatic disease consistent with history of metastatic prostate cancer. Aneurysmal dilatation ascending thoracic aorta 4.1 cm transverse. If clinically indicated based on patient age and comorbidities, recommend annual imaging followup by CTA or MRA. This recommendation follows 2010 ACCF/AHA/AATS/ACR/ASA/SCA/SCAI/SIR/STS/SVM Guidelines for the Diagnosis and Management of Patients with Thoracic Aortic Disease. Circulation. 2010; 121: Z308-M578. Aortic aneurysm NOS (ICD10-I71.9) . Aortic Atherosclerosis (ICD10-I70.0) Emphysema (ICD10-J43.9). Aortic aneurysm NOS (ICD10-I71.9). Findings called to Dr. Roger Shelter on 08/26/2020 at 1522 hours. Electronically Signed   By: Lavonia Dana M.D.   On: 08/26/2020 15:23   DG Chest Portable 1 View  Result Date: 08/25/2020 CLINICAL DATA:  Weakness, altered mental status. EXAM: PORTABLE CHEST 1 VIEW COMPARISON:  Chest radiograph dated 07/04/2020. FINDINGS: The heart size and mediastinal contours are within normal limits. Atelectasis/scarring in the  right lung base appear similar to prior exams. The left lung is clear. There is no pleural effusion or pneumothorax. Extensive osseous metastatic disease is redemonstrated. IMPRESSION: 1. No acute cardiopulmonary abnormality. 2. Extensive osseous metastatic disease. Electronically Signed   By: Zerita Boers M.D.   On: 08/25/2020 15:31    SIGNED: Deatra James, MD, FHM. [facility],  Pager (please use [email or web address] to page/text) Please use Epic Secure Chat for non-urgent communication (7AM-7PM)  If 7PM-7AM, please contact night-coverage [email or web address], 08/26/2020, 3:30 PM

## 2020-08-26 NOTE — Consult Note (Signed)
Consultation Note Date: 08/26/2020   Patient Name: Gregory [name]  DOB: 1928-11-02  MRN: [id number]  Age / Sex: 87 y.o., male  PCP: [name], FNP Referring Physician: Deatra James, MD  Reason for Consultation: Establishing goals of care  HPI/Patient Profile: 87 y.o. male  admitted on 08/25/2020    Clinical Assessment and Goals of Care: 87 year old gentleman who lives at home with his family, history of advanced prostate cancer with bone involvement, hypertension GERD.  Patient presented from oncology clinic because of shortness of breath dizziness lethargy.  Patient gets blood transfusions, was found to have hemoglobin of 7.4 and elevated lactic acid level.  Chart review was done, patient was connected with hospice services previously.  Patient has been requiring recurrent blood transfusions essentially since the last year year and a half.  Palliative medicine consultation for ongoing goals of care discussions has been requested  Patient is a frail elderly gentleman sitting up in his chair.  Patient was trying to participate with physical therapy earlier this morning.  Patient has elevated heart rate in the 130s and 140s, appears to be in atrial fibrillation even with the slightest exertion.  Patient does admit to shortness of breath even at rest presently.  He is to be started on supplemental oxygen.  I met with the patient's daughter Paulette at the bedside.  Introduced myself and palliative care as follows:  Palliative medicine is specialized medical care for people living with serious illness. It focuses on providing relief from the symptoms and stress of a serious illness. The goal is to improve quality of life for both the patient and the family.  Goals of care: Broad aims of medical therapy in relation to the patient's values and preferences. Our aim is to provide medical care aimed at  enabling patients to achieve the goals that matter most to them, given the circumstances of their particular medical situation and their constraints.   Brief life review performed.  Patient's functional status is such that he is mostly in bed or in his chair pretty much all of his waking hours.  Patient has had ongoing decline in interest in food for the past few weeks.  Patient's oral intake has been minimal.  Patient has had escalating symptom burden with pain and shortness of breath.  We discussed about the patient's current functional status as well as his serious life limiting incurable illness of prostate cancer which is in an advanced stage.  We discussed about risks benefits of blood transfusions and how that interacts with hospice philosophy of care.  Goals wishes and values attempted to be explored.  Patient's daughter states that the patient is having ongoing decline, we talked about foregoing any further blood transfusions and focusing exclusively on comfort measures.  Patient's daughter states that she has discussed about this with patient as well as her brother.  At this time, patient and family elected for full scope of comfort measures, no further blood transfusions.  Additionally, they are asking about residential hospice.  NEXT  OF KIN Son and her daughter.  Patient lives at home with daughter son that is his grandson.  Patient is divorced from his wife.  SUMMARY OF RECOMMENDATIONS   DNR/DNI No more blood transfusions Full scope of comfort measures Residential hospice. Code Status/Advance Care Planning:  DNR    Symptom Management:   Complains of dyspnea  Palliative Prophylaxis:   Delirium Protocol  Additional Recommendations (Limitations, Scope, Preferences):  Full Comfort Care  Psycho-social/Spiritual:   Desire for further Chaplaincy support:yes  Additional Recommendations: Education on Hospice  Prognosis:   < 2 weeks  Discharge Planning: Hospice facility       Primary Diagnoses: Present on Admission: . AMS (altered mental status) . Malignant neoplasm prostate ([place]) . Metastatic adenocarcinoma to bone ([place]) . Prostate cancer ([name]) . Diastolic dysfunction with acute on chronic heart failure ([place]) . Symptomatic anemia   I have reviewed the medical record, interviewed the patient and family, and examined the patient. The following aspects are pertinent.  Past Medical History:  Diagnosis Date  . Bone metastases ([place])   . BPH (benign prostatic hyperplasia)   . Death of wife 52  . Foley catheter in place    Urinary Retension  . GERD (gastroesophageal reflux disease)   . Hypertension   . Prostate cancer [facility])   urologist- dr dahlstedt/  oncologist-  dr manning    Gleason 4+5,  PSA 18.74,  w/  Visceral and Extensive Bones METS--  pallitive radiation therapy and hormone therapy   Social History   Socioeconomic History  . Marital status: Divorced    Spouse name: Not on file  . Number of children: Not on file  . Years of education: Not on file  . Highest education level: Not on file  Occupational History  . Occupation: Retired  Tobacco Use  . Smoking status: Former Smoker    Packs/day: 0.25    Years: 30.00    Pack years: 7.50    Types: Cigarettes    Start date: 07/11/1992  . Smokeless tobacco: Never Used  Vaping Use  . Vaping Use: Never used  Substance and Sexual Activity  . Alcohol use: No    Alcohol/week: 0.0 standard drinks  . Drug use: No  . Sexual activity: Never  Other Topics Concern  . Not on file  Social History Narrative  . Not on file   Social Determinants of Health   Financial Resource Strain: Not on file  Food Insecurity: Not on file  Transportation Needs: Not on file  Physical Activity: Not on file  Stress: Not on file  Social Connections: Not on file   Family History  Problem Relation Age of Onset  . Tuberculosis Mother 70  . Other Father 66   Scheduled Meds: . metoprolol succinate  50 mg Oral  Q breakfast  . tamsulosin  0.4 mg Oral Daily   Continuous Infusions: PRN Meds:.acetaminophen **OR** acetaminophen Medications Prior to Admission:  Prior to Admission medications   Medication Sig Start Date End Date Taking? Authorizing Provider  cholecalciferol (VITAMIN D3) 25 MCG (1000 UNIT) tablet Take 1,000 Units by mouth daily.   Yes [provider]  HYDROcodone-acetaminophen (NORCO) 5-325 MG tablet Take 1 tablet by mouth every 6 (six) hours as needed for moderate pain. 06/23/20  Yes Wyatt Portela, MD  metoprolol succinate (TOPROL-XL) 100 MG 24 hr tablet TAKE 1 TABLET (100 MG TOTAL) BY MOUTH DAILY. TAKE WITH OR IMMEDIATELY FOLLOWING A MEAL. Patient taking differently: Take 100 mg by mouth daily. 09/24/19  Yes [name], FNP  Multiple Vitamin (MULTIVITAMIN WITH MINERALS) TABS tablet Take 1 tablet by mouth daily.   Yes [provider]  tamsulosin (FLOMAX) 0.4 MG CAPS capsule Take 0.4 mg by mouth daily. 01/16/20  Yes [provider]  triamcinolone lotion (KENALOG) 0.1 % Apply 1 application topically 3 (three) times daily. Do not use on face. Patient taking differently: Apply 1 application topically 3 (three) times daily as needed (rash). 05/29/20  Yes Tanner, Lyndon Code., PA-C  polyethylene glycol (MIRALAX / GLYCOLAX) packet Take 17 g by mouth daily as needed for mild constipation. Patient not taking: No sig reported 09/05/15   Geradine Girt, DO   Allergies  Allergen Reactions  . Covid-19 (Adenovirus) Vaccine Other (See Comments)    breakout   Review of Systems Complains of dyspnea and generalized pain Physical Exam Awake alert sitting in chair Appears with mild to moderate distress Irregular, heart rate showing 130s to 140s on the monitor Generalized weakness Shallow clear breath sounds No edema Appears with muscle wasting  Vital Signs: BP 113/72 (BP Location: Right Arm)   Pulse (!) 144 Comment: with activity, RN and MD notified  Temp 97.6 F (36.4  C) (Oral)   Resp 20   Ht 6' (1.829 m)   Wt 61.8 kg   SpO2 100%   BMI 18.48 kg/m  Pain Scale: 0-10 POSS *See Group Information*: 1-Acceptable,Awake and alert Pain Score: 0-No pain   SpO2: SpO2: 100 % O2 Device:SpO2: 100 % O2 Flow Rate: .O2 Flow Rate (L/min): 2 L/min  IO: Intake/output summary:   Intake/Output Summary (Last 24 hours) at 08/26/2020 1143 Last data filed at 08/26/2020 1610 Gross per 24 hour  Intake 3080 ml  Output 650 ml  Net 2430 ml    LBM: Last BM Date: 08/25/20 Baseline Weight: Weight: 61.8 kg Most recent weight: Weight: 61.8 kg     Palliative Assessment/Data:   PPS 30%  Time In:  10 Time Out:  11 Time Total:  60  Greater than 50%  of this time was spent counseling and coordinating care related to the above assessment and plan.  Signed by: Loistine Chance, MD   Please contact Palliative Medicine Team phone at [phone] for questions and concerns.  For individual provider: See Shea Evans

## 2020-08-26 NOTE — TOC Initial Note (Signed)
Transition of Care [facility]) - Initial/Assessment Note    Patient Details  Name: Gregory [name] MRN: [id number] Date of Birth: [birth year]  Transition of Care [facility]) CM/SW Contact:    Gregory Ludwig, LCSW Phone Number: 08/26/2020, 4:25 PM  Clinical Narrative:                  Patient is an 87 year old male who lives with his daughter.  CSW received consult that patient's family have decided to pursue comfort care and hospice facility.  CSW spoke to patient's daughter Gregory [name] [phone] and she would like for CSW to pursue [facility] or [facility], depending on which one has availability the quickest.  CSW contact both facilities, they will review patient's information and let CSW know about availability.  CSW continuing to follow patient's progress throughout discharge plannning.  Expected Discharge Plan: [place] Barriers to Discharge: Hospice Bed not available   Patient Goals and CMS Choice Patient states their goals for this hospitalization and ongoing recovery are:: Per daughter, plan to go to hospice facility for end of life care. CMS Medicare.gov Compare Post Acute Care list provided to:: Patient Represenative (must comment) Choice offered to / list presented to : Adult Children  Expected Discharge Plan and Services Expected Discharge Plan: [place] In-house Referral: Clinical Social Work,Hospice / Palliative Care   Post Acute Care Choice: Hospice Living arrangements for the past 2 months: Single Family Home                                      Prior Living Arrangements/Services Living arrangements for the past 2 months: Single Family Home Lives with:: Adult Children Patient language and need for interpreter reviewed:: Yes Do you feel safe going back to the place where you live?: No   Patient's family feel that she needs end of life care at hospice facility.  Need for Family Participation in Patient Care: Yes  (Comment) Care giver support system in place?: Yes (comment)   Criminal Activity/Legal Involvement Pertinent to Current Situation/Hospitalization: No - Comment as needed  Activities of Daily Living Home Assistive Devices/Equipment: Eyeglasses,Dentures (specify type) (full set dentures) ADL Screening (condition at time of admission) Patient's cognitive ability adequate to safely complete daily activities?: Yes Is the patient deaf or have difficulty hearing?: Yes Does the patient have difficulty seeing, even when wearing glasses/contacts?: Yes Does the patient have difficulty concentrating, remembering, or making decisions?: Yes Patient able to express need for assistance with ADLs?: Yes Does the patient have difficulty dressing or bathing?: Yes Independently performs ADLs?: No Communication: Independent Dressing (OT): Needs assistance Is this a change from baseline?: Pre-admission baseline Grooming: Needs assistance Is this a change from baseline?: Pre-admission baseline Feeding: Needs assistance Is this a change from baseline?: Pre-admission baseline Bathing: Needs assistance Is this a change from baseline?: Pre-admission baseline Toileting: Needs assistance Is this a change from baseline?: Pre-admission baseline In/Out Bed: Needs assistance Is this a change from baseline?: Change from baseline, expected to last >3 days Walks in Home: Dependent Is this a change from baseline?: Pre-admission baseline Does the patient have difficulty walking or climbing stairs?: Yes Weakness of Legs: Both Weakness of Arms/Hands: Both  Permission Sought/Granted Permission sought to share information with : Facility Contact Representative,Family Supports Permission granted to share information with : Yes, Release of Information Signed  Share Information with NAME: Gregory [name], Gregory [name] Daughter  Gregory [name] Gregory [name] [phone]  [phone]  Gregory [name], Gregory [name] [phone]  [phone]  Permission granted to share info w AGENCY: Hospice facilities in [place] and [employer]        Emotional Assessment Appearance:: Appears stated age   Affect (typically observed): Accepting,Appropriate,Calm,Stable Orientation: : Oriented to Self Alcohol / Substance Use: Not Applicable Psych Involvement: No (comment)  Admission diagnosis:  Failure to thrive in adult [R62.7] Symptomatic anemia [D64.9] AMS (altered mental status) [R41.82] Patient Active Problem List   Diagnosis Date Noted  . AMS (altered mental status) 08/25/2020  . Symptomatic anemia 01/01/2020  . Chronic diastolic CHF (congestive heart failure) ([place]) 01/01/2020  . Hearing decreased, bilateral 12/29/2018  . Visual changes 12/29/2018  . Difficult intravenous access   . Elevated troponin 09/01/2015  . Diastolic dysfunction with acute on chronic heart failure ([place]) 09/01/2015  . AKI (acute kidney injury) ([place]) 08/30/2015  . Hyperkalemia 08/30/2015  . Prostate cancer ([place]) 08/29/2015  . Hematuria   . S/P TURP   . Urinary retention   . Metastatic adenocarcinoma to bone ([place]) 08/21/2015  . Essential hypertension 04/14/2015  . Malignant neoplasm prostate ([place]) 04/14/2015  . Hyperglycemia 04/14/2015  . Debility 04/14/2015   PCP:  [name], FNP Pharmacy:   [id number] [place], [address] [place] [address] Phone: [phone] Fax: [phone]     Social Determinants of Health (SDOH) Interventions    Readmission Risk Interventions No flowsheet data found.

## 2020-08-26 NOTE — Evaluation (Signed)
Physical Therapy Evaluation Patient Details Name: Gregory [name] MRN: [id number] DOB: 03-04-1929 Today's Date: 08/26/2020   History of Present Illness  87 y.o. 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 Dx of anemia, lactic acidosis, respiratory failure.  Clinical Impression  Pt admitted with above diagnosis. Mod assist for bed mobility and for bed to recliner transfers. HR up to 144 with activity, RN and MD notified. Pt performed stand pivot transfer x 3 with RW (bed to recliner to bedside commode to recliner). Pt reports he only walks a few feet at home with assist from his grandson. If family is able to continue providing 24*/7 care, he could DC home.  Pt currently with functional limitations due to the deficits listed below (see PT Problem List). Pt will benefit from skilled PT to increase their independence and safety with mobility to allow discharge to the venue listed below.       Follow Up Recommendations SNF;Supervision/Assistance - 24 hour;Supervision for mobility/OOB;Home health PT (HHPT if family able to provide needed level of care, otherwise SNF)    Equipment Recommendations  None recommended by PT    Recommendations for Other Services       Precautions / Restrictions Precautions Precautions: Fall Precaution Comments: ~4 falls in past 1 year Restrictions Weight Bearing Restrictions: No      Mobility  Bed Mobility Overal bed mobility: Needs Assistance Bed Mobility: Rolling;Sidelying to Sit Rolling: Mod assist Sidelying to sit: Mod assist       General bed mobility comments: VCs for technique, assist to initiate roll and to raise trunk    Transfers Overall transfer level: Needs assistance Equipment used: Rolling walker (2 wheeled) Transfers: Sit to/from [employer] Sit to Stand: Mod assist Stand pivot transfers: Mod assist       General transfer comment: VCs hand placement, mod A to power up, B knees buckled in standing but pt able to maintain standing with BUE support on RW; SPT x 3, HR up to 144 with activity, RN and hospitalist notified, activity tolerance limited by fatigue/ 3/4 dyspnea  Ambulation/Gait                Stairs            Wheelchair Mobility    Modified Rankin (Stroke Patients Only)       Balance Overall balance assessment: Needs assistance Sitting-balance support: Feet supported;Single extremity supported Sitting balance-Leahy Scale: Fair     Standing balance support: Bilateral upper extremity supported Standing balance-Leahy Scale: Poor                               Pertinent Vitals/Pain Pain Assessment: No/denies pain    Home Living Family/patient expects to be discharged to:: Private residence Living Arrangements: Other relatives Available Help at Discharge: Family;Available 24 hours/day Type of Home: House Home Access: Stairs to enter   [employer] of Steps: 6 Home Layout: One level Home Equipment: Walker - 4 wheels;Bedside commode;Wheelchair - manual      Prior Function Level of Independence: Needs assistance   Gait / Transfers Assistance Needed: grandson assists with transfers from bed to recliner, grandson lifts him up the stairs  ADL's / [place] Needed: assist from family        Hand Dominance  Extremity/Trunk Assessment   Upper Extremity Assessment Upper Extremity Assessment: Generalized weakness    Lower Extremity Assessment Lower Extremity Assessment: Generalized weakness;RLE deficits/detail;LLE deficits/detail RLE Deficits / Details: knee ext AROM -30*, PROM -5* LLE Deficits / Details: knee ext AROM -30*, -5* PROM LLE Sensation: WNL LLE Coordination: WNL    Cervical / Trunk Assessment Cervical / Trunk  Assessment: Normal  Communication   Communication: No difficulties  Cognition Arousal/Alertness: Awake/alert Behavior During Therapy: WFL for tasks assessed/performed Overall Cognitive Status: Within Functional Limits for tasks assessed                                        General Comments      Exercises     Assessment/Plan    PT Assessment Patient needs continued PT services  PT Problem List Decreased strength;Decreased mobility;Decreased balance;Decreased activity tolerance       PT Treatment Interventions Therapeutic activities;Therapeutic exercise;Patient/family education    PT Goals (Current goals can be found in the Care Plan section)  Acute Rehab PT Goals PT Goal Formulation: Patient unable to participate in goal setting (pt too fatigued to discuss goals, pt in a fib and MD entered room) Time For Goal Achievement: 09/09/20 Potential to Achieve Goals: Fair    Frequency Min 3X/week   Barriers to discharge        Co-evaluation               AM-PAC PT "6 Clicks" Mobility  Outcome Measure Help needed turning from your back to your side while in a flat bed without using bedrails?: A Lot Help needed moving from lying on your back to sitting on the side of a flat bed without using bedrails?: A Lot Help needed moving to and from a bed to a chair (including a wheelchair)?: A Lot Help needed standing up from a chair using your arms (e.g., wheelchair or bedside chair)?: A Lot Help needed to walk in hospital room?: Total Help needed climbing 3-5 steps with a railing? : Total 6 Click Score: 10    End of Session Equipment Utilized During Treatment: Gait belt Activity Tolerance: Patient limited by fatigue;Patient tolerated treatment well Patient left: in chair;with call bell/phone within reach;with chair alarm set;with nursing/sitter in room Nurse Communication: Mobility status PT Visit Diagnosis: Difficulty in walking, not elsewhere classified  (R26.2);Muscle weakness (generalized) (M62.81);History of falling (Z91.81)    Time: 1001-1039 PT Time Calculation (min) (ACUTE ONLY): 38 min   Charges:   PT Evaluation $PT Eval Moderate Complexity: 1 Mod PT Treatments $Therapeutic Activity: 23-37 mins       Blondell Reveal Kistler PT 08/26/2020  Acute Rehabilitation Services Pager [phone] Office [phone]

## 2020-08-27 DIAGNOSIS — R5381 Other malaise: Secondary | ICD-10-CM

## 2020-08-27 DIAGNOSIS — R627 Adult failure to thrive: Secondary | ICD-10-CM

## 2020-08-27 LAB — URINE CULTURE: Culture: NO GROWTH

## 2020-08-27 MED ORDER — ACETAMINOPHEN 325 MG PO TABS: 650.0000 mg | ORAL_TABLET | Freq: Four times a day (QID) | ORAL | 0 refills | Status: AC | PRN

## 2020-08-27 NOTE — Progress Notes (Signed)
Report called to Butch Penny at [facility] in [place]. Requesting both IVs be left in.  Awaiting PTAR to transport.

## 2020-08-27 NOTE — Discharge Summary (Signed)
Physician Discharge Summary [facility]    Patient: Gregory [name]                   Admit date: 08/25/2020   DOB: [birth year]             Discharge date:08/27/2020/11:35 AM LNL:[id number]                          PCP: [name], FNP  Disposition:    HOSPIC  Recommendations for Follow-up with Hospice:   . 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  Daughter Yeiren [place] [phone] Discussed with daughter yesterday, palliative care Dr. Rowe Pavy also was following.  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   Generally gets routine transfusions, last needed was December 2021.. On admission hemoglobin 7.4, lactic acid 5.3 (admitted for symptomatic anemia, lactic acidosis) It appears patient was on hospice previously since initiated blood transfusion has been discharged from hospice. Currently DNR/DNI  Symptomatic Anemia  Anemia of chronic disease  Hypotension: pt developed hypotension,  -Remained hemodynamically stable -Under comfort care measures 9   paleness, shortness of breath, lethargy, dizziness after being brought to infusion area from oncology clinic. Suspect this is 2/2 symptomatic anemia.  Transfuse 1 unit  pRBC on 08/25/2020 -Hemoglobin 7.4, 7.6  -Status post IV fluid  - bolus 1 L LR hypoproliferative retics Normal folate. Follow b12, ferritin, iron.  Anemia related to malignancy and disease to bone - has been getting supportive transfusion as needed  Lactic Acidosis:  -Improved suspect 2/2 hypovolemia in setting of symptomatic anemia and poor appetite Follow with transfusion and IVF --- H&H and blood pressure has improved -Lactic acid 5.3, 2.3, 1.3   Follow blood and urine cultures Hold off on antibiotics for now  Acute Hypoxic Respiratory Failure: -Stable, -Remains on 2 L of oxygen, satting 100% suspect this is related to anemia/for comfort in setting of dyspnea from above. CXRwithout acute abnormalities. Follow chest CT. Wean o2as able. Work up additionally as indicated.  Cognitive Impairment: -Remains confused alert oriented x2 - unreliable historian.  Seems to be close to his baseline. Suspect may have component of dementia. Head CT with evidence of metastatic disease as noted below. 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 Alen Blew recommended hospice given continued  Patient family is agreed to proceed with comfort care, hospice  Thrombocytopenia: 2/2 malignancy, follow Poor Appetite:likely 2/2 disease above, follow  Goals of Care: Palliative care team following, discussed with the patient family daughter in detail DNR/DNI Pursuing comfort care measures-hospice   he's previously been enrolled in hospice. Sounds like when he started getting his ~monthly transfusions  he transitioned off hospice. Daughter confirms his DNR. They're open to discussion again about hospice and overall goals of care.    Elevated troponin: mild, follow repeat, no CP. EKG with t wave flattening, inversion in V3-4. Low suspicion for ACS, likely demand related.   BPH: continue flomax HTN: Resuming p.o. metoprolol at lower dose  Code Status:DNR/ DNI  Family Communication:Paulette Disposition Plan: Patient is from:home  Anticipated DC barriers: Anticipating discharge to hospice when bed available Consults called:Palliative care c/s entered, Shadadadded to treatment team    Consultants: Heme oncologist/social worker      Discharge Instructions:   Discharge Instructions    Activity as tolerated - No restrictions   Complete by: As directed    Diet - low sodium heart healthy   Complete by: As directed    Discharge instructions   Complete by: As directed    Follow-up with hospice   Increase activity slowly   Complete by: As directed        Medication List    STOP taking these medications   cholecalciferol 25 MCG (1000 UNIT) tablet Commonly known as: VITAMIN D3   HYDROcodone-acetaminophen 5-325 MG tablet Commonly known as: Norco   multivitamin with minerals Tabs tablet     TAKE these medications   acetaminophen 325 MG tablet Commonly known as: TYLENOL Take 2 tablets (650 mg total) by mouth every 6 (six) hours as needed for mild pain (or Fever >/= 101).   metoprolol succinate 100 MG 24 hr tablet Commonly known as: TOPROL-XL TAKE 1 TABLET (100 MG TOTAL) BY MOUTH DAILY. TAKE WITH OR IMMEDIATELY FOLLOWING A MEAL. What changed: additional instructions   polyethylene glycol 17 g packet Commonly known as: MIRALAX / GLYCOLAX Take 17 g by mouth daily as needed for mild constipation.   tamsulosin 0.4 MG Caps capsule Commonly known as: FLOMAX Take 0.4 mg by  mouth daily.   triamcinolone lotion 0.1 % Commonly known as: KENALOG Apply 1 application topically 3 (three) times daily. Do not use on face. What changed:   when to take this  reasons to take this  additional instructions       Allergies  Allergen Reactions  . Covid-19 (Adenovirus) Vaccine Other (See Comments)    breakout     Procedures /Studies:   CT Head Wo Contrast  Result Date: 08/25/2020 CLINICAL DATA:  Altered mental status. History of metastatic prostate cancer. EXAM: CT HEAD WITHOUT CONTRAST TECHNIQUE: Contiguous axial images were obtained from the base of the skull through the vertex without intravenous contrast. COMPARISON:  July 04, 2020. FINDINGS: Brain: Diffuse cortical atrophy is again noted. There is interval development of mild to moderate size bilateral subdural hygromas with multiple dural-based nodular densities consistent with metastatic disease, which is significantly progressed since prior exam. At least 1 such lesion is seen involving the right para falcine region near the vertex of the skull. No hemorrhage or acute infarction is noted. No midline shift is noted. No hemorrhage is noted. Vascular: No hyperdense vessel or unexpected calcification. Skull: Sclerotic densities are noted in the skull base consistent with metastatic disease. Sinuses/Orbits: Right sphenoid sinusitis is noted. Other: Fluid is noted in the left mastoid air cells. IMPRESSION: 1. Interval development of mild to moderate size bilateral subdural hygromas with multiple dural-based  nodular densities consistent with metastatic disease, which is significantly progressed since prior exam. 2. Sclerotic densities are noted in the skull base consistent with metastatic disease. 3. Right sphenoid sinusitis. 4. Fluid is noted in the left mastoid air cells. Electronically Signed   By: Marijo Conception M.D.   On: 08/25/2020 16:29   CT CHEST WO CONTRAST  Result Date: 08/26/2020 CLINICAL DATA:  Chest  pain, shortness of breath, cough; history prostate cancer with osseous metastases, hypertension, GERD EXAM: CT CHEST WITHOUT CONTRAST TECHNIQUE: Multidetector CT imaging of the chest was performed following the standard protocol without IV contrast. Sagittal and coronal MPR images reconstructed from axial data set. COMPARISON:  Chest radiograph 08/25/2020; CT abdomen and pelvis 01/18/2019 FINDINGS: Cardiovascular: Atherosclerotic calcifications aorta, proximal great vessels, minimally in coronary arteries. Aneurysmal dilatation ascending thoracic aorta 4.1 cm transverse image 93. Heart unremarkable. No pericardial effusion. Mediastinum/Nodes: Esophagus unremarkable. Base of cervical region normal appearance. Few scattered normal size mediastinal lymph nodes without thoracic adenopathy. Lungs/Pleura: Small BILATERAL pleural effusions larger on RIGHT. Emphysematous changes and scarring at apices. Compressive atelectasis of lower lobes. Multiple radiodensities in the RIGHT lower lobe, could represent old granulomatous disease or aspirated contrast. No acute infiltrate or pneumothorax. Questionable nodular density 5 mm diameter LEFT upper lobe image 71. Upper Abdomen: Numerous ill-defined nodular densities throughout the liver consistent with widespread hepatic metastatic disease. These are new since 01/18/2019. Stomach distended by food debris. LEFT adrenal mass 2.6 x 2.3 cm new since prior exam consistent with metastasis. Few enlarged lymph nodes in gastrohepatic ligament. Musculoskeletal: Widespread sclerotic osseous metastases. IMPRESSION: Small BILATERAL pleural effusions larger on RIGHT with compressive atelectasis of lower lobes. Questionable 5 mm LEFT upper lobe nodule. LEFT adrenal and RIGHT spread hepatic metastases, new; these could be due to prostate cancer or to a second primary. Widespread osseous metastatic disease consistent with history of metastatic prostate cancer. Aneurysmal dilatation ascending  thoracic aorta 4.1 cm transverse. If clinically indicated based on patient age and comorbidities, recommend annual imaging followup by CTA or MRA. This recommendation follows 2010 ACCF/AHA/AATS/ACR/ASA/SCA/SCAI/SIR/STS/SVM Guidelines for the Diagnosis and Management of Patients with Thoracic Aortic Disease. Circulation. 2010; 121: U045-W098. Aortic aneurysm NOS (ICD10-I71.9) . Aortic Atherosclerosis (ICD10-I70.0) Emphysema (ICD10-J43.9). Aortic aneurysm NOS (ICD10-I71.9). Findings called to Dr. Roger Shelter on 08/26/2020 at 1522 hours. Electronically Signed   By: Lavonia Dana M.D.   On: 08/26/2020 15:23   DG Chest Portable 1 View  Result Date: 08/25/2020 CLINICAL DATA:  Weakness, altered mental status. EXAM: PORTABLE CHEST 1 VIEW COMPARISON:  Chest radiograph dated 07/04/2020. FINDINGS: The heart size and mediastinal contours are within normal limits. Atelectasis/scarring in the right lung base appear similar to prior exams. The left lung is clear. There is no pleural effusion or pneumothorax. Extensive osseous metastatic disease is redemonstrated. IMPRESSION: 1. No acute cardiopulmonary abnormality. 2. Extensive osseous metastatic disease. Electronically Signed   By: Zerita Boers M.D.   On: 08/25/2020 15:31     Subjective:   Patient was seen and examined 08/27/2020, 11:35 AM Patient stable today. No acute distress.  No issues overnight Stable for discharge.  Discharge Exam:    Vitals:   08/26/20 1435 08/26/20 2018 08/27/20 0356 08/27/20 0500  BP: 109/62 (!) 109/57 110/66   Pulse: 93 100 83   Resp:  15 15   Temp: 97.6 F (36.4 C) 98.4 F (36.9 C) 98.2 F (36.8 C)   TempSrc: Axillary Oral Oral   SpO2: 95% 100% 100%   Weight:    66.2 kg  Height:        General: Pt lying comfortably in bed & appears in no obvious distress.  Confused Cardiovascular: S1 & S2 heard, RRR, S1/S2 +. No murmurs, rubs, gallops or clicks. No JVD or pedal edema. Respiratory: Clear to auscultation without wheezing,  rhonchi or crackles. No increased work of breathing. Abdominal:  Non-distended, non-tender & soft. No organomegaly or masses appreciated. Normal bowel sounds heard. CNS: Alert and oriented. No focal deficits. Extremities: no edema, no cyanosis      The results of significant diagnostics from this hospitalization (including imaging, microbiology, ancillary and laboratory) are listed below for reference.      Microbiology:   Recent Results (from the past 240 hour(s))  Culture, blood (routine x 2)     Status: None (Preliminary result)   Collection Time: 08/25/20  3:30 PM   Specimen: BLOOD  Result Value Ref Range Status   Specimen Description   Final    BLOOD RIGHT ANTECUBITAL Performed at [facility] [address], [place], [place] [address]    Special Requests   Final    BOTTLES DRAWN AEROBIC AND ANAEROBIC Blood Culture adequate volume Performed at [facility] [address], [place], [place] [address]    Culture   Final    NO GROWTH 2 DAYS Performed at [facility] [address], [place], [place] [address]    Report Status PENDING  Incomplete  Resp Panel by RT-PCR (Flu A&B, Covid) Nasopharyngeal Swab     Status: None   Collection Time: 08/25/20  4:55 PM   Specimen: Nasopharyngeal Swab; Nasopharyngeal(NP) swabs in vial transport medium  Result Value Ref Range Status   SARS Coronavirus 2 by RT PCR NEGATIVE NEGATIVE Final    Comment: (NOTE) SARS-CoV-2 target nucleic acids are NOT DETECTED.  The SARS-CoV-2 RNA is generally detectable in upper respiratory specimens during the acute phase of infection. The lowest concentration of SARS-CoV-2 viral copies this assay can detect is 138 copies/mL. A negative result does not preclude SARS-Cov-2 infection and should not be used as the sole basis for treatment or other patient management decisions. A negative result may occur with  improper specimen collection/handling, submission of  specimen other than nasopharyngeal swab, presence of viral mutation(s) within the areas targeted by this assay, and inadequate number of viral copies(<138 copies/mL). A negative result must be combined with clinical observations, patient history, and epidemiological information. The expected result is Negative.  Fact Sheet for Patients:  EntrepreneurPulse.com.au  Fact Sheet for Healthcare Providers:  IncredibleEmployment.be  This test is no t yet approved or cleared by the [place] FDA and  has been authorized for detection and/or diagnosis of SARS-CoV-2 by FDA under an Emergency Use Authorization (EUA). This EUA will remain  in effect (meaning this test can be used) for the duration of the COVID-19 declaration under Section 564(b)(1) of the Act, 21 U.S.C.section 360bbb-3(b)(1), unless the authorization is terminated  or revoked sooner.       Influenza A by PCR NEGATIVE NEGATIVE Final   Influenza B by PCR NEGATIVE NEGATIVE Final    Comment: (NOTE) The Xpert Xpress SARS-CoV-2/FLU/RSV plus assay is intended as an aid in the diagnosis of influenza from Nasopharyngeal swab specimens and should not be used as a sole basis for treatment. Nasal washings and aspirates are unacceptable for Xpert Xpress SARS-CoV-2/FLU/RSV testing.  Fact Sheet for Patients: EntrepreneurPulse.com.au  Fact Sheet for Healthcare Providers: IncredibleEmployment.be  This test is not yet approved or cleared  by the [place] and has been authorized for detection and/or diagnosis of SARS-CoV-2 by FDA under an Emergency Use Authorization (EUA). This EUA will remain in effect (meaning this test can be used) for the duration of the COVID-19 declaration under Section 564(b)(1) of the Act, 21 U.S.C. section 360bbb-3(b)(1), unless the authorization is terminated or revoked.  Performed at [facility], [place]  [address], [place], [place] [address]   Culture, blood (routine x 2)     Status: None (Preliminary result)   Collection Time: 08/25/20  6:00 PM   Specimen: BLOOD  Result Value Ref Range Status   Specimen Description   Final    BLOOD BLOOD LEFT ARM Performed at [facility] [address], [place], [place] [address]    Special Requests   Final    BOTTLES DRAWN AEROBIC AND ANAEROBIC Blood Culture results may not be optimal due to an excessive volume of blood received in culture bottles Performed at [facility] [address], [place], [place] [address]    Culture   Final    NO GROWTH 2 DAYS Performed at [facility] [address] [place], [place] [address]    Report Status PENDING  Incomplete  Urine culture     Status: None   Collection Time: 08/25/20  7:40 PM   Specimen: Urine, Random  Result Value Ref Range Status   Specimen Description   Final    URINE, RANDOM Performed at [facility] [address], [place], [place] [address]    Special Requests   Final    NONE Performed at [facility], [place] [address] [place], [name] [address]    Culture   Final    NO GROWTH Performed at [facility], [place] [address], [place], [place] [address]    Report Status 08/27/2020 FINAL  Final     Labs:   CBC: Recent Labs  Lab 08/25/20 1203 08/26/20 0527  WBC 9.1 5.4  NEUTROABS 4.3  --   HGB 7.4* 7.6*  HCT 22.9* 23.8*  MCV 101.8* 100.0  PLT 90* 59*   Basic Metabolic Panel: Recent Labs  Lab 08/25/20 1530 08/26/20 0527  NA 141 139  K 3.7 3.2*  CL 101 105  CO2 24 23  GLUCOSE 141* 92  BUN 10 8  CREATININE 0.77 0.73  CALCIUM 8.9 8.1*  MG  --  1.7   Liver Function Tests: Recent Labs  Lab 08/25/20 1530 08/26/20 0527  AST 41 30  ALT 19 14  ALKPHOS 471* 336*  BILITOT 1.6* 1.0  PROT 6.9 5.2*  ALBUMIN 2.7* 2.1*   BNP (last 3 results) No results for input(s): BNP in the last  8760 hours. Cardiac Enzymes: No results for input(s): CKTOTAL, CKMB, CKMBINDEX, TROPONINI in the last 168 hours. CBG: Recent Labs  Lab 08/25/20 1313 08/25/20 1710  GLUCAP 129* 115*   Hgb A1c No results for input(s): HGBA1C in the last 72 hours. Lipid Profile No results for input(s): CHOL, HDL, LDLCALC, TRIG, CHOLHDL, LDLDIRECT in the last 72 hours. Thyroid function studies No results for input(s): TSH, T4TOTAL, T3FREE, THYROIDAB in the last 72 hours.  Invalid input(s): FREET3 Anemia work up Recent Labs    08/25/20 1530  VITAMINB12 2,037*  FOLATE 8.7  FERRITIN >7,500*  TIBC 139*  IRON 145  RETICCTPCT 2.5   Urinalysis    Component Value Date/Time   COLORURINE YELLOW 08/25/2020 1940   APPEARANCEUR  CLEAR 08/25/2020 1940   LABSPEC 1.012 08/25/2020 1940   PHURINE 5.0 08/25/2020 1940   GLUCOSEU NEGATIVE 08/25/2020 1940   HGBUR NEGATIVE 08/25/2020 1940   BILIRUBINUR NEGATIVE 08/25/2020 1940   BILIRUBINUR Negative 07/01/2019 1339   KETONESUR 5 (A) 08/25/2020 1940   PROTEINUR NEGATIVE 08/25/2020 1940   UROBILINOGEN 0.2 07/01/2019 1339   UROBILINOGEN 0.2 03/27/2017 1327   NITRITE NEGATIVE 08/25/2020 1940   LEUKOCYTESUR NEGATIVE 08/25/2020 1940         Time coordinating discharge: Over 45 minutes  SIGNED: Deatra James, MD, FACP, FHM. [facility],  Please use [email or web address] to Page If 7PM-7AM, please contact night-coverage [email or web address] 08/27/2020, 11:35 AM

## 2020-08-27 NOTE — Progress Notes (Signed)
Daily Progress Note   Patient Name: Gregory [name]       Date: 08/27/2020 DOB: [birth year]  Age: 87 y.o. MRN#: [id number] Attending Physician: Deatra James, MD Primary Care Physician: [name], FNP Admit Date: 08/25/2020  Reason for Consultation/Follow-up: Establishing goals of care  Subjective: More awake today, discussed with patient about his current condition and hospice.    Length of Stay: 2  Current Medications: Scheduled Meds:  . metoprolol succinate  50 mg Oral Q breakfast  . tamsulosin  0.4 mg Oral Daily    Continuous Infusions:   PRN Meds: acetaminophen **OR** acetaminophen, glycopyrrolate, morphine injection  Physical Exam         Awake alert No distress Regular work of breathing Abdomen not distended Patient has muscle wasting S1 S 2  Vital Signs: BP 110/66 (BP Location: Right Arm)   Pulse 83   Temp 98.2 F (36.8 C) (Oral)   Resp 15   Ht 6' (1.829 m)   Wt 66.2 kg   SpO2 100%   BMI 19.79 kg/m  SpO2: SpO2: 100 % O2 Device: O2 Device: Nasal Cannula O2 Flow Rate: O2 Flow Rate (L/min): 2 L/min  Intake/output summary:   Intake/Output Summary (Last 24 hours) at 08/27/2020 1232 Last data filed at 08/27/2020 4401 Gross per 24 hour  Intake 540 ml  Output 1650 ml  Net -1110 ml   LBM: Last BM Date: 08/25/20 Baseline Weight: Weight: 61.8 kg Most recent weight: Weight: 66.2 kg       Palliative Assessment/Data:      Patient Active Problem List   Diagnosis Date Noted  . AMS (altered mental status) 08/25/2020  . Symptomatic anemia 01/01/2020  . Chronic diastolic CHF (congestive heart failure) ([place]) 01/01/2020  . Hearing decreased, bilateral 12/29/2018  . Visual changes 12/29/2018  . Difficult intravenous access   . Elevated troponin  09/01/2015  . Diastolic dysfunction with acute on chronic heart failure ([place]) 09/01/2015  . AKI (acute kidney injury) ([place]) 08/30/2015  . Hyperkalemia 08/30/2015  . Prostate cancer ([place]) 08/29/2015  . Hematuria   . S/P TURP   . Urinary retention   . Metastatic adenocarcinoma to bone ([place]) 08/21/2015  . Essential hypertension 04/14/2015  . Malignant neoplasm prostate ([place]) 04/14/2015  . Hyperglycemia 04/14/2015  . Debility 04/14/2015    Palliative  Care Assessment & Plan   Patient Profile:    Assessment:  87 year old gentleman who lives at home with his family, history of advanced prostate cancer with bone involvement, hypertension GERD.  Patient presented from oncology clinic because of shortness of breath dizziness lethargy.  Patient gets blood transfusions, was found to have hemoglobin of 7.4 and elevated lactic acid level.  Chart review was done, patient was connected with hospice services previously.  Patient has been requiring recurrent blood transfusions essentially since the last year year and a half.  Palliative medicine consultation for ongoing goals of care discussions has been requested.  Recommendations/Plan:   discussed with patient in detail about residential hospice, he is agreeable.   Goals of Care and Additional Recommendations:  Limitations on Scope of Treatment: Full Comfort Care  Code Status:    Code Status Orders  (From admission, onward)         Start     Ordered   08/25/20 1829  Do not attempt resuscitation (DNR)  Continuous       Question Answer Comment  In the event of cardiac or respiratory ARREST Do not call a "code blue"   In the event of cardiac or respiratory ARREST Do not perform Intubation, CPR, defibrillation or ACLS   In the event of cardiac or respiratory ARREST Use medication by any route, position, wound care, and other measures to relive pain and suffering. May use oxygen, suction and manual treatment of airway obstruction as needed  for comfort.      08/25/20 1828        Code Status History    Date Active Date Inactive Code Status Order ID Comments User Context   01/01/2020 1728 01/03/2020 1819 DNR [id number]  Edwin Dada, MD Inpatient   08/29/2015 0006 09/05/2015 1835 Full Code [id number]  Rana Snare, MD ED   Advance Care Planning Activity       Prognosis:   < 2 weeks  Discharge Planning:  Hospice facility  Care plan was discussed with  Patient and [facility] MD  Thank you for allowing the Palliative Medicine Team to assist in the care of this patient.   Time In: 12 Time Out: 12.35 Total Time 35 Prolonged Time Billed  no       Greater than 50%  of this time was spent counseling and coordinating care related to the above assessment and plan.  Loistine Chance, MD  Please contact Palliative Medicine Team phone at [phone] for questions and concerns.

## 2020-08-27 NOTE — Progress Notes (Signed)
   I have reviewed chart and discussed with the pt's daughter Jeanne Ivan and or MD Dr. Ander Purpura.. All agree that pt. Goals are in line with hospice services. Our MD has approved the pt for Hospice Home in [facility]. We do have a bed to offer and the daughter has accepted this offer.   I have reached out to the pt's CM Juliann Pulse and she will update MD and arrange ambulance. Webb Silversmith RN [phone]

## 2020-08-27 NOTE — TOC Transition Note (Addendum)
Transition of Care [facility]) - CM/SW Discharge Note   Patient Details  Name: Gregory [name] MRN: [id number] Date of Birth: [birth year]  Transition of Care [facility]) CM/SW Contact:  Dessa Phi, RN Phone Number: 08/27/2020, 12:05 PM   Clinical Narrative: Received [facility] in [place] rep [place]-dtr Paulette chose them, they have bed available today-CM unable to leave vm since full on Paulettes phone-rep Cheri awaiting dtr Paulette to complete paperwork for signing in-then will provide CM with rm#, & tel# for nsg report,then will call [facility]-nsg to have DNR,med necessity,face sheet from printer for packet.   2:30p-[facility] [place] ready for patient-nsg call report tel#[phone].[facility] called. No further CM needs.  Final next level of care: [place] Barriers to Discharge: No Barriers Identified   Patient Goals and CMS Choice Patient states their goals for this hospitalization and ongoing recovery are:: Per daughter, plan to go to hospice facility for end of life care. CMS Medicare.gov Compare Post Acute Care list provided to:: Patient Represenative (must comment) Choice offered to / list presented to : Adult Children  Discharge Placement              Patient chooses bed at: Other - please specify in the comment section below: ([facility] in [place]) Patient to be transferred to facility by: [facility] [place] Name of family member notified: Paulette-unable to leave vm full Patient and family notified of of transfer: 08/27/20  Discharge Plan and Services In-house Referral: Clinical Social Work,Hospice / [place] Acute Care Choice: Hospice                               Social Determinants of Health (SDOH) Interventions     Readmission Risk Interventions No flowsheet data found.

## 2020-08-27 NOTE — Progress Notes (Signed)
PROGRESS NOTE    Patient: Servando Salina                            PCP: [name], FNP                    DOB: 10/22/1928            DOA: 08/25/2020 AJO:[id number]             DOS: 08/27/2020, 10:48 AM   LOS: 2 days   Date of Service: The patient was seen and examined on 08/27/2020  Subjective:   Patient was seen and examined, remained comfortable, confused   Daughter Gatlyn Lipari [phone] Discussed with daughter yesterday, palliative care Dr. Rowe Pavy also following.  Patient is agreeable to comfort care measures and hospice   Brief Narrative:   AVEDIS BEVIS is a 87 y.o. 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   Generally gets routine transfusions, last needed was December 2021.. On admission hemoglobin 7.4, lactic acid 5.3 (admitted for symptomatic anemia, lactic acidosis) It appears patient was on hospice previously since initiated blood transfusion has been discharged from hospice. 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  Suspect this is 2/2 symptomatic anemia.   Transfuse 1 unit pRBC on 08/25/2020 -Hemoglobin 7.4, 7.6  -Status post IV fluid  - bolus 1 L LR hypoproliferative retics Normal folate. Follow b12, ferritin, iron.  Anemia related to  malignancy and disease to bone - has been getting supportive transfusion as needed  Lactic Acidosis:  -Improved suspect 2/2 hypovolemia in setting of symptomatic anemia and poor appetite Follow with transfusion and IVF --- H&H and blood pressure has improved -Lactic acid 5.3, 2.3, 1.3   Follow blood and urine cultures Hold off on antibiotics for now  Acute Hypoxic Respiratory Failure: -Stable, -Remains on 2 L of oxygen, satting 100% suspect this is related to anemia/for comfort in setting of dyspnea from above.  CXR without acute abnormalities.  Follow chest CT.  Wean o2 as able.  Work up additionally as indicated.  Cognitive Impairment:  -Remains confused alert oriented x2 - unreliable historian.   Seems to be close to his baseline.  Suspect may have component of dementia.  Head CT with evidence of metastatic disease as noted below. 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 Alen Blew recommended hospice given continued  Patient family is agreed to proceed with comfort care, hospice  Thrombocytopenia: 2/2 malignancy, follow  Poor Appetite: likely 2/2 disease above, follow  Goals of Care: Palliative care team following, discussed with the patient family daughter in detail DNR/DNI Pursuing comfort care measures-hospice   he's previously been enrolled in hospice.  Sounds like when he started getting his ~monthly transfusions he transitioned off hospice.  Daughter confirms his DNR.  They're open to discussion again about hospice and overall goals of care.    Elevated troponin: mild, follow  repeat, no CP.  EKG with t wave flattening, inversion in V3-4.  Low suspicion for ACS, likely demand related.   BPH: continue flomax HTN: Resuming p.o. metoprolol at lower dose   DVT prophylaxis:      SCD Code Status:              DNR/ DNI   Family Communication:       Paulette  Disposition Plan:              Patient is from:                        home              Anticipated DC barriers:        Anticipating discharge to hospice when bed available Consults called:        Palliative care c/s entered, Shadad added to treatment team  Admission status:      Status is: Inpatient  Level of care: Progressive    ------------------------------------------------------------------------------------------------------------------------------------ Cultures; Blood Cultures x 2 >> NGT Urine Culture  >>> NGT  Antimicrobials: None   Consultants: Heme oncologist/social worker   -----------------------------------------------------------------------------------------------------------------------------------   Procedures:   No admission procedures for hospital encounter.    Antimicrobials:  Anti-infectives (From admission, onward)   None       Medication:  . metoprolol succinate  50 mg Oral Q breakfast  . tamsulosin  0.4 mg Oral Daily    acetaminophen **OR** acetaminophen, glycopyrrolate, morphine injection   Objective:   Vitals:   08/26/20 1435 08/26/20 2018 08/27/20 0356 08/27/20 0500  BP: 109/62 (!) 109/57 110/66   Pulse: 93 100 83   Resp:  15 15   Temp: 97.6 F (36.4 C) 98.4 F (36.9 C) 98.2 F (36.8 C)   TempSrc: Axillary Oral Oral   SpO2: 95% 100% 100%   Weight:    66.2 kg  Height:        Intake/Output Summary (Last 24 hours) at 08/27/2020 1048 Last data filed at 08/27/2020 0538 Gross per 24 hour  Intake 540 ml  Output 1650 ml  Net -1110 ml   Filed Weights   08/25/20 2220 08/26/20 0500 08/27/20 0500  Weight: 61.8 kg 61.8 kg 66.2 kg      Examination:      Physical Exam:   General:   Confused, comfortable  HEENT:  Normocephalic, PERRL, otherwise with in Normal limits   Neuro:   Limited exam-patient is remains confused CNII-XII intact. , normal motor and sensation, reflexes intact   Lungs:   Clear to auscultation BL, Respirations unlabored, no wheezes / crackles  Cardio:    S1/S2, RRR, No murmure, No Rubs or Gallops   Abdomen:   Soft, non-tender, bowel sounds active all four quadrants,  no guarding or peritoneal signs.  Muscular skeletal:  Limited exam - in bed, able to move all 4 extremities  2+ pulses,  symmetric, No pitting edema  Skin:  Dry, warm to touch, negative for any Rashes,  Wounds: Please see  nursing documentation      -----------------------------------------------------------------------------------------------------------------------------------    LABs:  CBC Latest Ref Rng & Units 08/26/2020 08/25/2020 08/04/2020  WBC 4.0 - 10.5 K/uL 5.4 9.1 8.8  Hemoglobin 13.0 - 17.0 g/dL 7.6(L) 7.4(L) 9.0(L)  Hematocrit 39.0 - 52.0 % 23.8(L) 22.9(L) 29.1(L)  Platelets 150 - 400 K/uL 59(L) 90(L) 140(L)   CMP Latest Ref Rng & Units 08/26/2020 08/25/2020 07/04/2020  Glucose 70 - 99 mg/dL 92 141(H) 93  BUN 8 - 23 mg/dL 8 10 7(L)  Creatinine 0.61 - 1.24 mg/dL 0.73 0.77 0.60(L)  Sodium 135 - 145 mmol/L 139 141 140  Potassium 3.5 - 5.1 mmol/L 3.2(L) 3.7 4.5  Chloride 98 - 111 mmol/L 105 101 103  CO2 22 - 32 mmol/L 23 24 -  Calcium 8.9 - 10.3 mg/dL 8.1(L) 8.9 -  Total Protein 6.5 - 8.1 g/dL 5.2(L) 6.9 -  Total Bilirubin 0.3 - 1.2 mg/dL 1.0 1.6(H) -  Alkaline Phos 38 - 126 U/L 336(H) 471(H) -  AST 15 - 41 U/L 30 41 -  ALT 0 - 44 U/L 14 19 -       Micro Results Recent Results (from the past 240 hour(s))  Culture, blood (routine x 2)     Status: None (Preliminary result)   Collection Time: 08/25/20  3:30 PM   Specimen: BLOOD  Result Value Ref Range Status   Specimen Description   Final    BLOOD RIGHT  ANTECUBITAL Performed at [facility], [place] [address] [place], [place] [address]    Special Requests   Final    BOTTLES DRAWN AEROBIC AND ANAEROBIC Blood Culture adequate volume Performed at [facility] [address] [place], [place] [address]    Culture   Final    NO GROWTH 2 DAYS Performed at [facility] [address] [place], [place] [address]    Report Status PENDING  Incomplete  Resp Panel by RT-PCR (Flu A&B, Covid) Nasopharyngeal Swab     Status: None   Collection Time: 08/25/20  4:55 PM   Specimen: Nasopharyngeal Swab; Nasopharyngeal(NP) swabs in vial transport medium  Result Value Ref Range Status   SARS Coronavirus 2 by RT PCR NEGATIVE NEGATIVE Final    Comment: (NOTE) SARS-CoV-2 target nucleic acids are NOT DETECTED.  The SARS-CoV-2 RNA is generally detectable in upper respiratory specimens during the acute phase of infection. The lowest concentration of SARS-CoV-2 viral copies this assay can detect is 138 copies/mL. A negative result does not preclude SARS-Cov-2 infection and should not be used as the sole basis for treatment or other patient management decisions. A negative result may occur with  improper specimen collection/handling, submission of specimen other than nasopharyngeal swab, presence of viral mutation(s) within the areas targeted by this assay, and inadequate number of viral copies(<138 copies/mL). A negative result must be combined with clinical observations, patient history, and epidemiological information. The expected result is Negative.  Fact Sheet for Patients:  EntrepreneurPulse.com.au  Fact Sheet for Healthcare Providers:  IncredibleEmployment.be  This test is no t yet approved or cleared by the [place] FDA and  has been authorized for detection and/or diagnosis of SARS-CoV-2 by FDA under an Emergency Use Authorization (EUA). This EUA will remain   in effect (meaning this test can be used) for the duration of the COVID-19 declaration under Section 564(b)(1) of the Act, 21 U.S.C.section 360bbb-3(b)(1), unless the authorization is terminated  or revoked sooner.       Influenza A  by PCR NEGATIVE NEGATIVE Final   Influenza B by PCR NEGATIVE NEGATIVE Final    Comment: (NOTE) The Xpert Xpress SARS-CoV-2/FLU/RSV plus assay is intended as an aid in the diagnosis of influenza from Nasopharyngeal swab specimens and should not be used as a sole basis for treatment. Nasal washings and aspirates are unacceptable for Xpert Xpress SARS-CoV-2/FLU/RSV testing.  Fact Sheet for Patients: EntrepreneurPulse.com.au  Fact Sheet for Healthcare Providers: IncredibleEmployment.be  This test is not yet approved or cleared by the [place] FDA and has been authorized for detection and/or diagnosis of SARS-CoV-2 by FDA under an Emergency Use Authorization (EUA). This EUA will remain in effect (meaning this test can be used) for the duration of the COVID-19 declaration under Section 564(b)(1) of the Act, 21 U.S.C. section 360bbb-3(b)(1), unless the authorization is terminated or revoked.  Performed at [facility], [place] [address] [place], [place] [address]   Culture, blood (routine x 2)     Status: None (Preliminary result)   Collection Time: 08/25/20  6:00 PM   Specimen: BLOOD  Result Value Ref Range Status   Specimen Description   Final    BLOOD BLOOD LEFT ARM Performed at [facility] [address] [place], [place] [address]    Special Requests   Final    BOTTLES DRAWN AEROBIC AND ANAEROBIC Blood Culture results may not be optimal due to an excessive volume of blood received in culture bottles Performed at [facility] [address], [place], [place] [address]    Culture   Final    NO GROWTH 2 DAYS Performed at [facility] [address], [place], [place] [address]    Report Status PENDING  Incomplete  Urine culture     Status: None   Collection Time: 08/25/20  7:40 PM   Specimen: Urine, Random  Result Value Ref Range Status   Specimen Description   Final    URINE, RANDOM Performed at [facility] [address] [place], [place] [address]    Special Requests   Final    NONE Performed at [facility], [place] [address] [place], [place] [address]    Culture   Final    NO GROWTH Performed at [facility], [place] [address] [place], [place] [address]    Report Status 08/27/2020 FINAL  Final    Radiology Reports CT Head Wo Contrast  Result Date: 08/25/2020 CLINICAL DATA:  Altered mental status. History of metastatic prostate cancer. EXAM: CT HEAD WITHOUT CONTRAST TECHNIQUE: Contiguous axial images were obtained from the base of the skull through the vertex without intravenous contrast. COMPARISON:  July 04, 2020. FINDINGS: Brain: Diffuse cortical atrophy is again noted. There is interval development of mild to moderate size bilateral subdural hygromas with multiple dural-based nodular densities consistent with metastatic disease, which is significantly progressed since prior exam. At least 1 such lesion is seen involving the right para falcine region near the vertex of the skull. No hemorrhage or acute infarction is noted. No midline shift is noted. No hemorrhage is noted. Vascular: No hyperdense vessel or unexpected calcification. Skull: Sclerotic densities are noted in the skull base consistent with metastatic disease. Sinuses/Orbits: Right sphenoid sinusitis is noted. Other: Fluid is noted in the left mastoid air cells. IMPRESSION: 1. Interval development of mild to moderate size bilateral subdural hygromas with multiple dural-based nodular densities consistent with metastatic disease, which is significantly progressed since prior exam. 2. Sclerotic  densities are noted in  the skull base consistent with metastatic disease. 3. Right sphenoid sinusitis. 4. Fluid is noted in the left mastoid air cells. Electronically Signed   By: Marijo Conception M.D.   On: 08/25/2020 16:29   CT CHEST WO CONTRAST  Result Date: 08/26/2020 CLINICAL DATA:  Chest pain, shortness of breath, cough; history prostate cancer with osseous metastases, hypertension, GERD EXAM: CT CHEST WITHOUT CONTRAST TECHNIQUE: Multidetector CT imaging of the chest was performed following the standard protocol without IV contrast. Sagittal and coronal MPR images reconstructed from axial data set. COMPARISON:  Chest radiograph 08/25/2020; CT abdomen and pelvis 01/18/2019 FINDINGS: Cardiovascular: Atherosclerotic calcifications aorta, proximal great vessels, minimally in coronary arteries. Aneurysmal dilatation ascending thoracic aorta 4.1 cm transverse image 93. Heart unremarkable. No pericardial effusion. Mediastinum/Nodes: Esophagus unremarkable. Base of cervical region normal appearance. Few scattered normal size mediastinal lymph nodes without thoracic adenopathy. Lungs/Pleura: Small BILATERAL pleural effusions larger on RIGHT. Emphysematous changes and scarring at apices. Compressive atelectasis of lower lobes. Multiple radiodensities in the RIGHT lower lobe, could represent old granulomatous disease or aspirated contrast. No acute infiltrate or pneumothorax. Questionable nodular density 5 mm diameter LEFT upper lobe image 71. Upper Abdomen: Numerous ill-defined nodular densities throughout the liver consistent with widespread hepatic metastatic disease. These are new since 01/18/2019. Stomach distended by food debris. LEFT adrenal mass 2.6 x 2.3 cm new since prior exam consistent with metastasis. Few enlarged lymph nodes in gastrohepatic ligament. Musculoskeletal: Widespread sclerotic osseous metastases. IMPRESSION: Small BILATERAL pleural effusions larger on RIGHT with compressive atelectasis of lower lobes. Questionable  5 mm LEFT upper lobe nodule. LEFT adrenal and RIGHT spread hepatic metastases, new; these could be due to prostate cancer or to a second primary. Widespread osseous metastatic disease consistent with history of metastatic prostate cancer. Aneurysmal dilatation ascending thoracic aorta 4.1 cm transverse. If clinically indicated based on patient age and comorbidities, recommend annual imaging followup by CTA or MRA. This recommendation follows 2010 ACCF/AHA/AATS/ACR/ASA/SCA/SCAI/SIR/STS/SVM Guidelines for the Diagnosis and Management of Patients with Thoracic Aortic Disease. Circulation. 2010; 121: R678-L381. Aortic aneurysm NOS (ICD10-I71.9) . Aortic Atherosclerosis (ICD10-I70.0) Emphysema (ICD10-J43.9). Aortic aneurysm NOS (ICD10-I71.9). Findings called to Dr. Roger Shelter on 08/26/2020 at 1522 hours. Electronically Signed   By: Lavonia Dana M.D.   On: 08/26/2020 15:23   DG Chest Portable 1 View  Result Date: 08/25/2020 CLINICAL DATA:  Weakness, altered mental status. EXAM: PORTABLE CHEST 1 VIEW COMPARISON:  Chest radiograph dated 07/04/2020. FINDINGS: The heart size and mediastinal contours are within normal limits. Atelectasis/scarring in the right lung base appear similar to prior exams. The left lung is clear. There is no pleural effusion or pneumothorax. Extensive osseous metastatic disease is redemonstrated. IMPRESSION: 1. No acute cardiopulmonary abnormality. 2. Extensive osseous metastatic disease. Electronically Signed   By: Zerita Boers M.D.   On: 08/25/2020 15:31    SIGNED: Deatra James, MD, FHM. [facility],  Pager (please use [email or web address] to page/text) Please use Epic Secure Chat for non-urgent communication (7AM-7PM)  If 7PM-7AM, please contact night-coverage [email or web address], 08/27/2020, 10:48 AM

## 2020-08-28 NOTE — Progress Notes (Signed)
Contacted to see this patient in infusion.  The patient had been brought to the infusion room for treatment today.  The patient was extremely lethargic.  He would open his eyes to painful stimuli then gradually would open his eyes to verbal commands.  He Reports having that he was extremely tired.  Initially he would not respond however.  A fingerstick blood sugar returned with a glucose of 129.  He was taken to the emergency room via wheelchair for evaluation and management.  Sandi Mealy, MHS, PA-C Physician Assistant

## 2020-08-29 LAB — TYPE AND SCREEN
ABO/RH(D): A POS
Antibody Screen: NEGATIVE
Unit division: 0
Unit division: 0

## 2020-08-29 LAB — BPAM RBC
Blood Product Expiration Date: 202203072359
Blood Product Expiration Date: 202203072359
ISSUE DATE / TIME: 202202151628
Unit Type and Rh: 6200
Unit Type and Rh: 6200

## 2020-08-30 LAB — CULTURE, BLOOD (ROUTINE X 2): Culture: NO GROWTH

## 2020-09-01 LAB — CULTURE, BLOOD (ROUTINE X 2): Special Requests: ADEQUATE

## 2020-09-24 ENCOUNTER — Inpatient Hospital Stay: Payer: Medicare Other

## 2020-09-24 ENCOUNTER — Inpatient Hospital Stay: Payer: Medicare Other | Attending: Oncology

## 2020-10-09 DEATH — deceased

## 2020-10-22 ENCOUNTER — Inpatient Hospital Stay

## 2020-12-18 ENCOUNTER — Telehealth: Payer: Self-pay | Admitting: Nurse Practitioner

## 2020-12-18 NOTE — Telephone Encounter (Signed)
Pt was called VM was left for Pt to call the [facility] to schedule his AWV

## 2021-08-23 IMAGING — DX DG CHEST 1V PORT
1 series · 2 of 2 positions shown · non-contrast
Comparison: 09/02/2015

CLINICAL DATA: Syncope.

EXAM:
PORTABLE CHEST 1 VIEW

[Series 1: chest ap · 0.14mm/px · 2 of 2 slices shown]
[im 1/2]
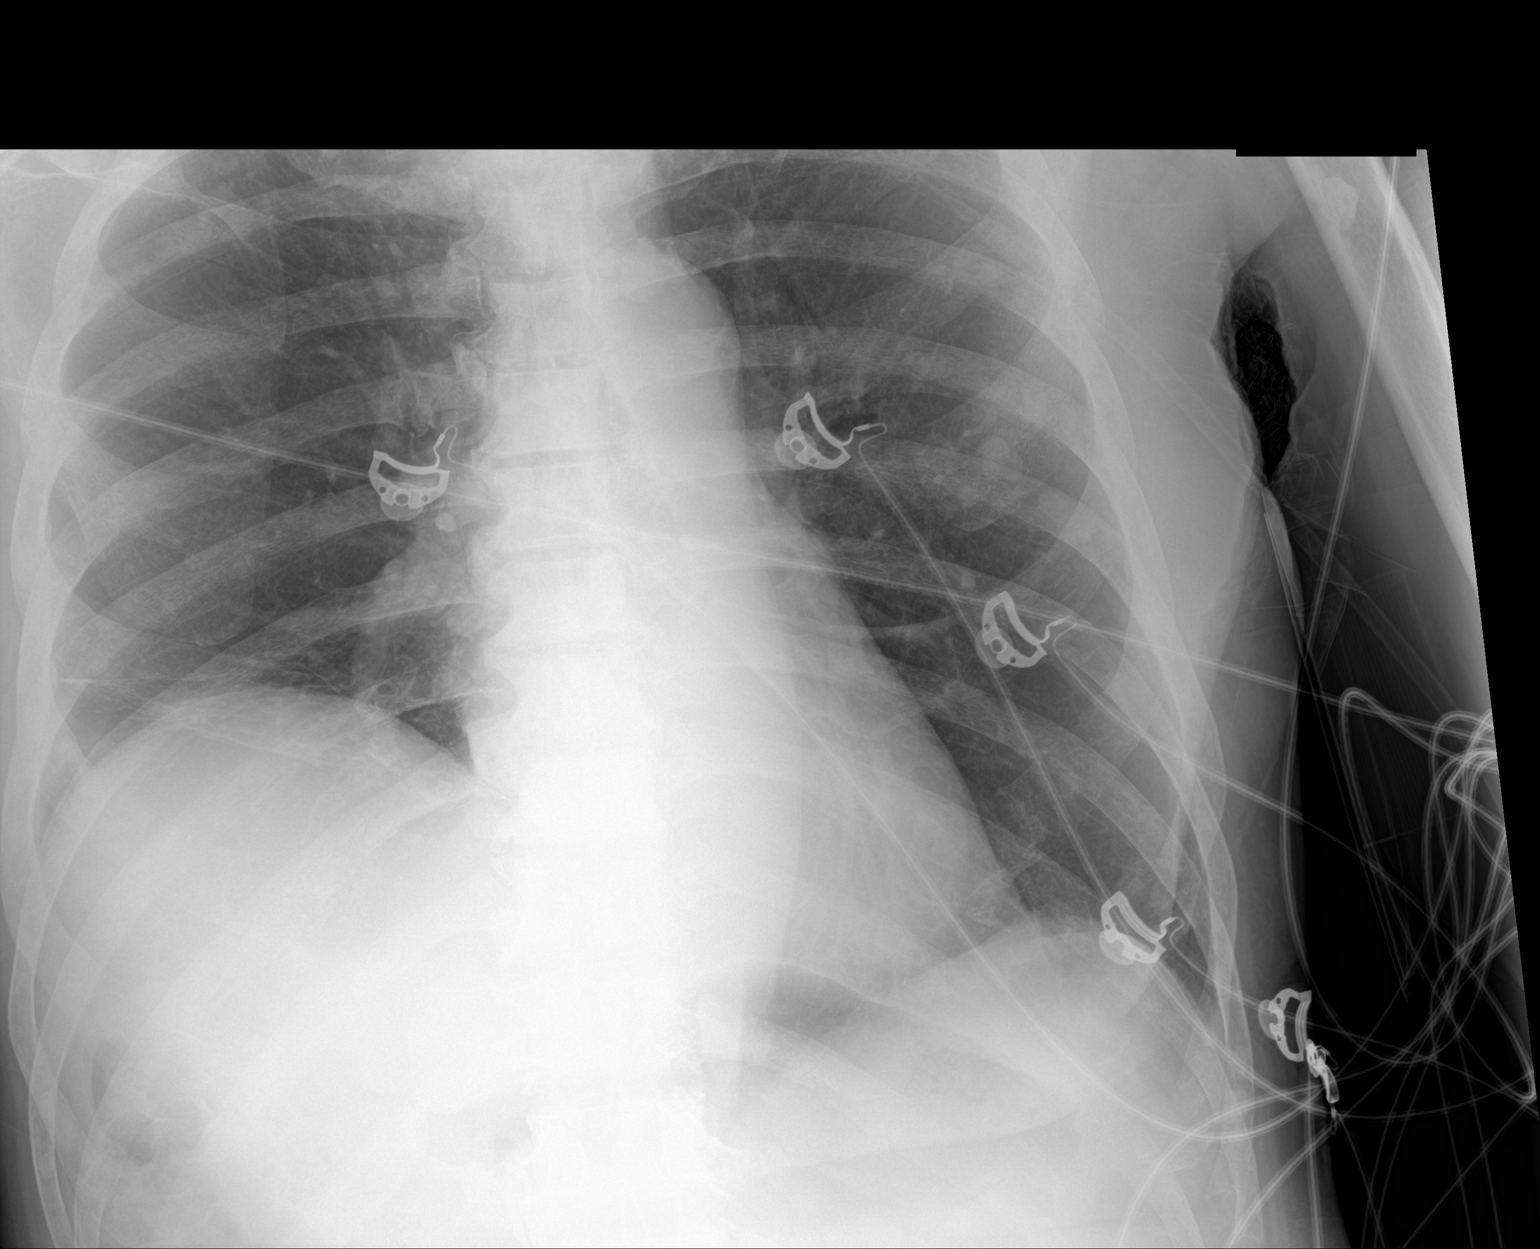
[im 2/2]
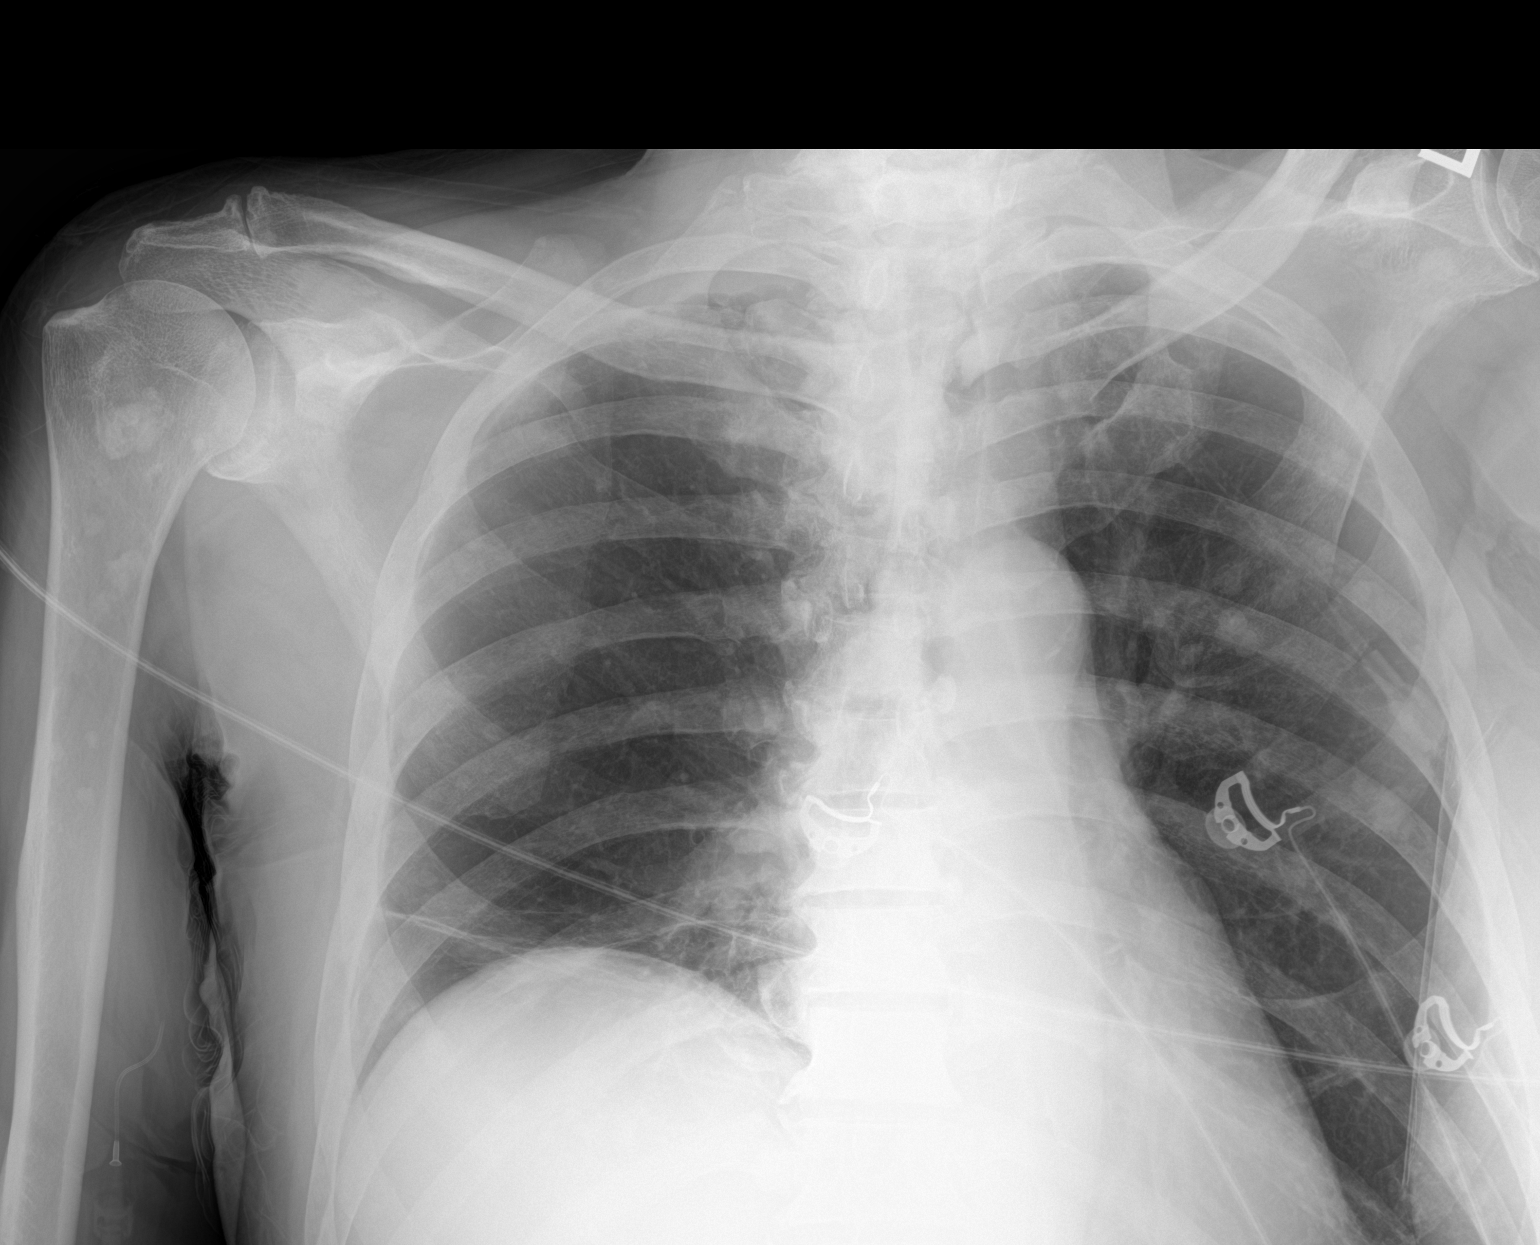

[2 of 2 positions shown; findings below may reference images not displayed]

FINDINGS: Normal heart size. Chronic asymmetric elevation of right
hemidiaphragm. No pleural effusions, pulmonary edema or airspace
consolidation. Diffuse sclerotic bone metastases noted.
IMPRESSION: 1. No acute cardiopulmonary abnormalities.
2. Diffuse sclerotic bone metastases.

## 2022-02-24 IMAGING — DX DG CHEST 1V PORT
1 series · 1 of 1 positions shown · non-contrast
Comparison: 01/01/2020

CLINICAL DATA: Cough

EXAM:
PORTABLE CHEST 1 VIEW

[chest ap]
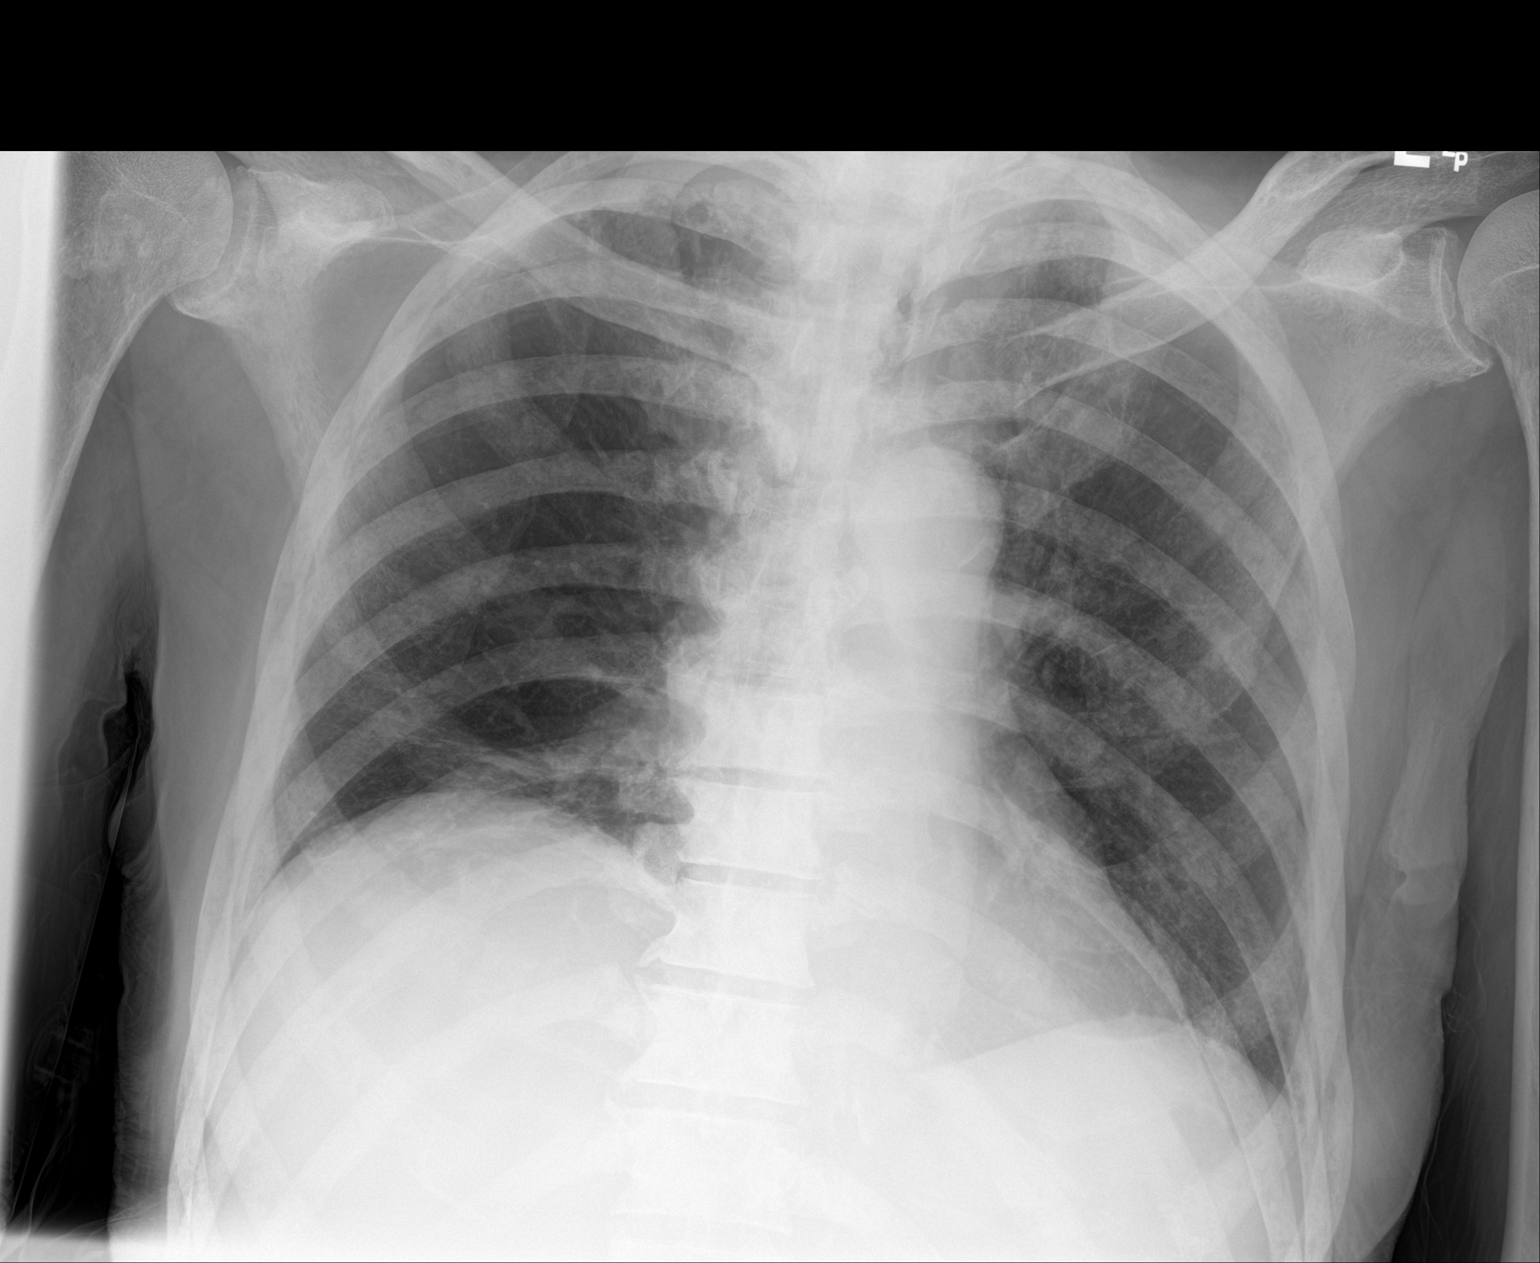

[1 of 1 positions shown; findings below may reference images not displayed]

FINDINGS: Streaky density at the right base, similar to prior and associated
with elevated diaphragm. No convincing consolidation. No edema,
effusion, or pneumothorax. Normal heart size and mediastinal
contours. Extensive osseous metastatic disease, known.
IMPRESSION: 1. No acute finding.
2. Chronic right diaphragm elevation with overlying scar.
